# Patient Record
Sex: Female | Born: 1945 | Race: White | Hispanic: No | Marital: Single | Smoking: Former smoker
Health system: Southern US, Community
[De-identification: ages and names within clinical notes are randomized; demographics above are authoritative.]

## PROBLEM LIST (undated history)

## (undated) DIAGNOSIS — M542 Cervicalgia: Secondary | ICD-10-CM

## (undated) DIAGNOSIS — E049 Nontoxic goiter, unspecified: Secondary | ICD-10-CM

## (undated) DIAGNOSIS — R37 Sexual dysfunction, unspecified: Secondary | ICD-10-CM

## (undated) DIAGNOSIS — R0602 Shortness of breath: Secondary | ICD-10-CM

## (undated) DIAGNOSIS — Z8489 Family history of other specified conditions: Secondary | ICD-10-CM

## (undated) DIAGNOSIS — H409 Unspecified glaucoma: Secondary | ICD-10-CM

## (undated) DIAGNOSIS — I10 Essential (primary) hypertension: Secondary | ICD-10-CM

## (undated) DIAGNOSIS — R87619 Unspecified abnormal cytological findings in specimens from cervix uteri: Secondary | ICD-10-CM

## (undated) DIAGNOSIS — Z780 Asymptomatic menopausal state: Secondary | ICD-10-CM

## (undated) DIAGNOSIS — M1990 Unspecified osteoarthritis, unspecified site: Secondary | ICD-10-CM

## (undated) DIAGNOSIS — G8929 Other chronic pain: Secondary | ICD-10-CM

## (undated) DIAGNOSIS — C50919 Malignant neoplasm of unspecified site of unspecified female breast: Secondary | ICD-10-CM

## (undated) DIAGNOSIS — R200 Anesthesia of skin: Secondary | ICD-10-CM

## (undated) DIAGNOSIS — E7800 Pure hypercholesterolemia, unspecified: Secondary | ICD-10-CM

## (undated) DIAGNOSIS — H269 Unspecified cataract: Secondary | ICD-10-CM

## (undated) DIAGNOSIS — Z87898 Personal history of other specified conditions: Secondary | ICD-10-CM

## (undated) DIAGNOSIS — R112 Nausea with vomiting, unspecified: Secondary | ICD-10-CM

## (undated) DIAGNOSIS — R7303 Prediabetes: Secondary | ICD-10-CM

## (undated) DIAGNOSIS — M549 Dorsalgia, unspecified: Secondary | ICD-10-CM

## (undated) DIAGNOSIS — IMO0002 Reserved for concepts with insufficient information to code with codable children: Secondary | ICD-10-CM

## (undated) DIAGNOSIS — T7840XA Allergy, unspecified, initial encounter: Secondary | ICD-10-CM

## (undated) DIAGNOSIS — F32A Depression, unspecified: Secondary | ICD-10-CM

## (undated) DIAGNOSIS — Z9889 Other specified postprocedural states: Secondary | ICD-10-CM

## (undated) DIAGNOSIS — F329 Major depressive disorder, single episode, unspecified: Secondary | ICD-10-CM

## (undated) DIAGNOSIS — R238 Other skin changes: Secondary | ICD-10-CM

## (undated) DIAGNOSIS — R233 Spontaneous ecchymoses: Secondary | ICD-10-CM

## (undated) DIAGNOSIS — D649 Anemia, unspecified: Secondary | ICD-10-CM

## (undated) DIAGNOSIS — G709 Myoneural disorder, unspecified: Secondary | ICD-10-CM

## (undated) DIAGNOSIS — H53459 Other localized visual field defect, unspecified eye: Secondary | ICD-10-CM

## (undated) DIAGNOSIS — M810 Age-related osteoporosis without current pathological fracture: Secondary | ICD-10-CM

## (undated) DIAGNOSIS — E039 Hypothyroidism, unspecified: Secondary | ICD-10-CM

## (undated) DIAGNOSIS — K219 Gastro-esophageal reflux disease without esophagitis: Secondary | ICD-10-CM

## (undated) DIAGNOSIS — R350 Frequency of micturition: Secondary | ICD-10-CM

## (undated) DIAGNOSIS — T4145XA Adverse effect of unspecified anesthetic, initial encounter: Secondary | ICD-10-CM

## (undated) DIAGNOSIS — T8859XA Other complications of anesthesia, initial encounter: Secondary | ICD-10-CM

## (undated) HISTORY — DX: Dorsalgia, unspecified: M54.9

## (undated) HISTORY — DX: Personal history of other specified conditions: Z87.898

## (undated) HISTORY — DX: Age-related osteoporosis without current pathological fracture: M81.0

## (undated) HISTORY — DX: Prediabetes: R73.03

## (undated) HISTORY — DX: Reserved for concepts with insufficient information to code with codable children: IMO0002

## (undated) HISTORY — DX: Anemia, unspecified: D64.9

## (undated) HISTORY — DX: Depression, unspecified: F32.A

## (undated) HISTORY — DX: Unspecified abnormal cytological findings in specimens from cervix uteri: R87.619

## (undated) HISTORY — DX: Myoneural disorder, unspecified: G70.9

## (undated) HISTORY — PX: BIOPSY THYROID: PRO38

## (undated) HISTORY — DX: Malignant neoplasm of unspecified site of unspecified female breast: C50.919

## (undated) HISTORY — PX: BREAST SURGERY: SHX581

## (undated) HISTORY — DX: Nontoxic goiter, unspecified: E04.9

## (undated) HISTORY — PX: CATARACT EXTRACTION, BILATERAL: SHX1313

## (undated) HISTORY — DX: Major depressive disorder, single episode, unspecified: F32.9

## (undated) HISTORY — DX: Sexual dysfunction, unspecified: R37

## (undated) HISTORY — DX: Pure hypercholesterolemia, unspecified: E78.00

## (undated) HISTORY — DX: Unspecified cataract: H26.9

## (undated) HISTORY — DX: Other chronic pain: G89.29

## (undated) HISTORY — DX: Cervicalgia: M54.2

## (undated) HISTORY — PX: TUBAL LIGATION: SHX77

## (undated) HISTORY — DX: Allergy, unspecified, initial encounter: T78.40XA

## (undated) HISTORY — DX: Unspecified glaucoma: H40.9

## (undated) HISTORY — DX: Asymptomatic menopausal state: Z78.0

---

## 1976-11-25 HISTORY — PX: LAPAROSCOPY: SHX197

## 1995-11-26 DIAGNOSIS — C50919 Malignant neoplasm of unspecified site of unspecified female breast: Secondary | ICD-10-CM

## 1995-11-26 HISTORY — PX: OTHER SURGICAL HISTORY: SHX169

## 1995-11-26 HISTORY — DX: Malignant neoplasm of unspecified site of unspecified female breast: C50.919

## 2008-11-25 HISTORY — PX: OTHER SURGICAL HISTORY: SHX169

## 2009-04-03 DIAGNOSIS — C50919 Malignant neoplasm of unspecified site of unspecified female breast: Secondary | ICD-10-CM | POA: Insufficient documentation

## 2009-04-03 DIAGNOSIS — M1990 Unspecified osteoarthritis, unspecified site: Secondary | ICD-10-CM | POA: Insufficient documentation

## 2009-04-03 DIAGNOSIS — E785 Hyperlipidemia, unspecified: Secondary | ICD-10-CM | POA: Insufficient documentation

## 2009-04-03 DIAGNOSIS — E049 Nontoxic goiter, unspecified: Secondary | ICD-10-CM | POA: Insufficient documentation

## 2009-08-15 DIAGNOSIS — Z800 Family history of malignant neoplasm of digestive organs: Secondary | ICD-10-CM | POA: Insufficient documentation

## 2009-08-15 DIAGNOSIS — G8929 Other chronic pain: Secondary | ICD-10-CM | POA: Insufficient documentation

## 2009-08-15 DIAGNOSIS — Z9889 Other specified postprocedural states: Secondary | ICD-10-CM | POA: Insufficient documentation

## 2009-09-28 DIAGNOSIS — G9520 Unspecified cord compression: Secondary | ICD-10-CM | POA: Insufficient documentation

## 2010-07-04 DIAGNOSIS — Z8669 Personal history of other diseases of the nervous system and sense organs: Secondary | ICD-10-CM | POA: Insufficient documentation

## 2010-07-04 DIAGNOSIS — M542 Cervicalgia: Secondary | ICD-10-CM | POA: Insufficient documentation

## 2010-09-25 DIAGNOSIS — E069 Thyroiditis, unspecified: Secondary | ICD-10-CM | POA: Insufficient documentation

## 2011-09-19 LAB — HM PAP SMEAR

## 2011-09-19 LAB — HM MAMMOGRAPHY

## 2011-09-30 DIAGNOSIS — E785 Hyperlipidemia, unspecified: Secondary | ICD-10-CM | POA: Insufficient documentation

## 2012-03-19 ENCOUNTER — Ambulatory Visit (INDEPENDENT_AMBULATORY_CARE_PROVIDER_SITE_OTHER): Payer: BC Managed Care – PPO | Admitting: Internal Medicine

## 2012-03-19 ENCOUNTER — Ambulatory Visit (HOSPITAL_BASED_OUTPATIENT_CLINIC_OR_DEPARTMENT_OTHER)
Admission: RE | Admit: 2012-03-19 | Discharge: 2012-03-19 | Disposition: A | Discharge status: Home / Self Care | Payer: MEDICARE | Source: Ambulatory Visit | Attending: Internal Medicine | Admitting: Internal Medicine

## 2012-03-19 ENCOUNTER — Encounter: Payer: Self-pay | Admitting: Internal Medicine

## 2012-03-19 DIAGNOSIS — L659 Nonscarring hair loss, unspecified: Secondary | ICD-10-CM

## 2012-03-19 DIAGNOSIS — G8929 Other chronic pain: Secondary | ICD-10-CM

## 2012-03-19 DIAGNOSIS — E049 Nontoxic goiter, unspecified: Secondary | ICD-10-CM

## 2012-03-19 DIAGNOSIS — E7800 Pure hypercholesterolemia, unspecified: Secondary | ICD-10-CM | POA: Insufficient documentation

## 2012-03-19 DIAGNOSIS — D649 Anemia, unspecified: Secondary | ICD-10-CM | POA: Insufficient documentation

## 2012-03-19 DIAGNOSIS — R921 Mammographic calcification found on diagnostic imaging of breast: Secondary | ICD-10-CM | POA: Insufficient documentation

## 2012-03-19 DIAGNOSIS — H409 Unspecified glaucoma: Secondary | ICD-10-CM

## 2012-03-19 DIAGNOSIS — R928 Other abnormal and inconclusive findings on diagnostic imaging of breast: Secondary | ICD-10-CM

## 2012-03-19 DIAGNOSIS — H269 Unspecified cataract: Secondary | ICD-10-CM | POA: Insufficient documentation

## 2012-03-19 DIAGNOSIS — E041 Nontoxic single thyroid nodule: Secondary | ICD-10-CM

## 2012-03-19 DIAGNOSIS — M542 Cervicalgia: Secondary | ICD-10-CM

## 2012-03-19 DIAGNOSIS — F32A Depression, unspecified: Secondary | ICD-10-CM | POA: Insufficient documentation

## 2012-03-19 DIAGNOSIS — F329 Major depressive disorder, single episode, unspecified: Secondary | ICD-10-CM | POA: Insufficient documentation

## 2012-03-19 DIAGNOSIS — G8928 Other chronic postprocedural pain: Secondary | ICD-10-CM

## 2012-03-19 DIAGNOSIS — C50919 Malignant neoplasm of unspecified site of unspecified female breast: Secondary | ICD-10-CM

## 2012-03-19 DIAGNOSIS — E785 Hyperlipidemia, unspecified: Secondary | ICD-10-CM

## 2012-03-19 DIAGNOSIS — Z87891 Personal history of nicotine dependence: Secondary | ICD-10-CM

## 2012-03-19 LAB — CBC WITH DIFFERENTIAL/PLATELET
Basophils Absolute: 0 10*3/uL (ref 0.0–0.1)
Basophils Relative: 0 % (ref 0–1)
Eosinophils Absolute: 0.1 10*3/uL (ref 0.0–0.7)
Eosinophils Relative: 1 % (ref 0–5)
HCT: 37.4 % (ref 36.0–46.0)
Hemoglobin: 12.7 g/dL (ref 12.0–15.0)
Lymphocytes Relative: 39 % (ref 12–46)
Lymphs Abs: 3.2 10*3/uL (ref 0.7–4.0)
MCH: 30.8 pg (ref 26.0–34.0)
MCHC: 34 g/dL (ref 30.0–36.0)
MCV: 90.6 fL (ref 78.0–100.0)
Monocytes Absolute: 0.5 10*3/uL (ref 0.1–1.0)
Monocytes Relative: 6 % (ref 3–12)
Neutro Abs: 4.4 10*3/uL (ref 1.7–7.7)
Neutrophils Relative %: 54 % (ref 43–77)
Platelets: 285 10*3/uL (ref 150–400)
RBC: 4.13 MIL/uL (ref 3.87–5.11)
RDW: 12.6 % (ref 11.5–15.5)
WBC: 8.2 10*3/uL (ref 4.0–10.5)

## 2012-03-19 MED ORDER — IBUPROFEN 800 MG PO TABS: 800.0000 mg | ORAL_TABLET | Freq: Four times a day (QID) | ORAL | Status: DC | PRN

## 2012-03-19 NOTE — Progress Notes (Signed)
Subjective:    Patient ID: [name] [name], female    DOB: 29-Apr-1946, 66 y.o.   MRN: [id number]  HPI  New pt here for first visit.  Has only been in [place] for 3 monrths.  Former care in [place].    PMH of R breast CA S/P lumpectomy and XRT, with Tamoxifen for 5 years., anemia  Breast calcifications on mammogram 08/2011, remote abnormal pap, Hyperlipidemia, thyroid goiter with benign thyroid bx per pt report, glaucoma, and former smoker.   She also has chronic neck and back pain since her neck fusion in 2010.   She uses Motrin 800 mg for this.    Overall doing well but wonder if her thyroid is normal as she has dry skin and hair is falling out.    Regarding chronic pain, she has had steroid injetions and does not wish any more of these or narcotic meds.  She would like to see a neurologist to discuss options.    No Known Allergies Past Medical History  Diagnosis Date  . Chronic back pain   . Chronic neck pain   . Goiter   . Breast cancer   . Cataracts, bilateral   . Abnormal Pap smear   . Anemia   . Depression   . High cholesterol   . Post-menopausal   . Sexual dysfunction   . Urinary incontinence    Past Surgical History  Procedure Date  . Lumpectomy 1997    Rt  . Cataract extraction, bilateral [id number]  . Laparoscopy 1978    BTL  . Neck fusion 2010  . Biopsy thyroid    History   Social History  . Marital Status: Single    Spouse Name: N/A    Number of Children: N/A  . Years of Education: N/A   Occupational History  . retired    Social History Main Topics  . Smoking status: Former Smoker -- 0.5 packs/day for 40 years    Types: Cigarettes  . Smokeless tobacco: Never Used  . Alcohol Use: Yes     1-2 per month  . Drug Use: No  . Sexually Active: Not Currently   Other Topics Concern  . Not on file   Social History Narrative  . No narrative on file   Family History  Problem Relation Age of Onset  . Cervical cancer Daughter   . Heart disease Sister   .  Heart disease Mother   . Heart disease Brother   . Colon cancer Father   . Stroke Sister   . Cancer Brother     esophageal  . Asthma Maternal Grandmother   . Diabetes Mother   . Diabetes Brother   . Hypertension Mother   . Hypertension Sister   . Hypertension Brother   . Thyroid disease Sister   . Thyroid disease Daughter    There is no problem list on file for this patient.  No current outpatient prescriptions on file prior to visit.     Review of Systems See HPI    Objective:   Physical Exam Physical Exam  Nursing note and vitals reviewed.  Constitutional: She is oriented to person, place, and time. She appears well-developed and well-nourished.  HENT:  Head: Normocephalic and atraumatic.  Neck  Slight thyroid enlargement Cardiovascular: Normal rate and regular rhythm. Exam reveals no gallop and no friction rub.  No murmur heard.  Pulmonary/Chest: Breath sounds normal. She has no wheezes. She has no rales.  Neurological: She is alert and oriented to  person, place, and time.  Skin: Skin is warm and dry.  Psychiatric: She has a normal mood and affect. Her behavior is normal.        Assessment & Plan:  1) History of goiter  Will get thyroid ultrasound check free levels today 2)  Hyperlipidemia:  Recheck today 3)  Chronic neck/back pain  Will refer to Dr. Wyvonne Lenz to have Motrin for now 4)  R breast CA:  Will need old records  Had calcifications per pt report on prior mammogram 5)  Anemia  Check today 6)

## 2012-03-19 NOTE — Patient Instructions (Signed)
Labs will be mailed to you  Referral to Dr. Maple Hudson  See me in 2 months

## 2012-03-20 LAB — COMPREHENSIVE METABOLIC PANEL
ALT: 15 U/L (ref 0–35)
AST: 14 U/L (ref 0–37)
Albumin: 4.6 g/dL (ref 3.5–5.2)
Alkaline Phosphatase: 60 U/L (ref 39–117)
BUN: 15 mg/dL (ref 6–23)
CO2: 28 mEq/L (ref 19–32)
Calcium: 9.6 mg/dL (ref 8.4–10.5)
Chloride: 98 mEq/L (ref 96–112)
Creat: 0.72 mg/dL (ref 0.50–1.10)
Glucose, Bld: 89 mg/dL (ref 70–99)
Potassium: 4.1 mEq/L (ref 3.5–5.3)
Sodium: 135 mEq/L (ref 135–145)
Total Bilirubin: 0.4 mg/dL (ref 0.3–1.2)
Total Protein: 7.1 g/dL (ref 6.0–8.3)

## 2012-03-20 LAB — T3, FREE: T3, Free: 2.7 pg/mL (ref 2.3–4.2)

## 2012-03-20 LAB — VITAMIN D 25 HYDROXY (VIT D DEFICIENCY, FRACTURES): Vit D, 25-Hydroxy: 40 ng/mL (ref 30–89)

## 2012-03-20 LAB — LIPID PANEL
Cholesterol: 243 mg/dL — ABNORMAL HIGH (ref 0–200)
HDL: 50 mg/dL (ref >39)
LDL Cholesterol: 160 mg/dL — ABNORMAL HIGH (ref 0–99)
Total CHOL/HDL Ratio: 4.9 Ratio
Triglycerides: 163 mg/dL — ABNORMAL HIGH (ref <150)
VLDL: 33 mg/dL (ref 0–40)

## 2012-03-20 LAB — T4, FREE: Free T4: 1.02 ng/dL (ref 0.80–1.80)

## 2012-03-20 LAB — VITAMIN B12: Vitamin B-12: 914 pg/mL — ABNORMAL HIGH (ref 211–911)

## 2012-03-23 ENCOUNTER — Telehealth: Payer: Self-pay | Admitting: Internal Medicine

## 2012-03-23 ENCOUNTER — Encounter: Payer: Self-pay | Admitting: *Deleted

## 2012-03-23 NOTE — Telephone Encounter (Signed)
Called home and mobile number and left message we are calling with some information, please call office

## 2012-03-23 NOTE — Progress Notes (Signed)
Called patient home and mobile number and left messages we are calling with some information from your doctor , please call office Tuesday am.

## 2012-03-23 NOTE — Telephone Encounter (Signed)
Message copied by Gerome Apley on Mon Mar 23, 2012 12:48 PM ------      Message from: Raechel Chute D      Created: Mon Mar 23, 2012  8:33 AM       Bonita Quin            Call pt and let her know that her bad cholesterol is very high.  I need to discuss with her in office.  Tell Ardenia to give her a 30 min appt and message back to me with appt time            Thanks

## 2012-03-23 NOTE — Telephone Encounter (Signed)
Left message on mobile and home phone to call regarding xray results

## 2012-03-24 NOTE — Telephone Encounter (Signed)
[name] [name] called and left another message returning our call. Randol Kern and discussed her bad cholesterol levels very high and gave her the result at her request. Also scheduled an appoinment to discuss with Dr. Constance Goltz for 03/26/12 at 4:15

## 2012-03-26 ENCOUNTER — Ambulatory Visit (INDEPENDENT_AMBULATORY_CARE_PROVIDER_SITE_OTHER): Payer: BC Managed Care – PPO | Admitting: Internal Medicine

## 2012-03-26 ENCOUNTER — Encounter: Payer: Self-pay | Admitting: Internal Medicine

## 2012-03-26 VITALS — BP 134/65 | HR 55 | Temp 97.6°F | Resp 16 | Ht 63.5 in | Wt 169.0 lb

## 2012-03-26 DIAGNOSIS — E785 Hyperlipidemia, unspecified: Secondary | ICD-10-CM

## 2012-03-26 DIAGNOSIS — G8928 Other chronic postprocedural pain: Secondary | ICD-10-CM

## 2012-03-26 MED ORDER — IBUPROFEN 800 MG PO TABS: 800.0000 mg | ORAL_TABLET | Freq: Four times a day (QID) | ORAL | Status: DC | PRN

## 2012-03-26 MED ORDER — SIMVASTATIN 5 MG PO TABS: 5.0000 mg | ORAL_TABLET | Freq: Every day | ORAL | Status: DC

## 2012-03-26 NOTE — Progress Notes (Signed)
Subjective:    Patient ID: [name] [name], female    DOB: 09/18/1946, 66 y.o.   MRN: [id number]  HPI  See lipids.  Pt reports she was on lipitor and vytorin in past but could not tolerate due to muscle aches. Strong  Pending consult with neurologist for pain control.  She has been on chronic Motrin for years Framingham risk analysis done and reviewed with pt.  Risk 12%  No Known Allergies Past Medical History  Diagnosis Date  . Chronic back pain   . Chronic neck pain   . Abnormal Pap smear   . Post-menopausal   . Sexual dysfunction   . Urinary incontinence   . Anemia   . Breast cancer   . Cataracts, bilateral   . Depression   . High cholesterol   . Goiter    Past Surgical History  Procedure Date  . Lumpectomy 1997    Rt  . Cataract extraction, bilateral [id number]  . Laparoscopy 1978    BTL  . Neck fusion 2010  . Biopsy thyroid    History   Social History  . Marital Status: Single    Spouse Name: N/A    Number of Children: N/A  . Years of Education: N/A   Occupational History  . retired    Social History Main Topics  . Smoking status: Former Smoker -- 0.5 packs/day for 40 years    Types: Cigarettes  . Smokeless tobacco: Never Used  . Alcohol Use: Yes     1-2 per month  . Drug Use: No  . Sexually Active: Not Currently   Other Topics Concern  . Not on file   Social History Narrative  . No narrative on file   Family History  Problem Relation Age of Onset  . Cervical cancer Daughter   . Heart disease Sister   . Heart disease Mother   . Heart disease Brother   . Colon cancer Father   . Stroke Sister   . Cancer Brother     esophageal  . Asthma Maternal Grandmother   . Diabetes Mother   . Diabetes Brother   . Hypertension Mother   . Hypertension Sister   . Hypertension Brother   . Thyroid disease Sister   . Thyroid disease Daughter    Patient Active Problem List  Diagnoses  . Anemia  . Breast cancer  . Cataracts, bilateral  . Depression    . High cholesterol  . Goiter  . Glaucoma  . Former smoker  . Chronic pain following surgery or procedure  . Breast calcifications on mammogram   Current Outpatient Prescriptions on File Prior to Visit  Medication Sig Dispense Refill  . fish oil-omega-3 fatty acids 1000 MG capsule Take 1 g by mouth daily.      Marland Kitchen ibuprofen (ADVIL,MOTRIN) 800 MG tablet Take 1 tablet (800 mg total) by mouth every 6 (six) hours as needed.  90 tablet  0  . Iodine Strong, Lugols, (IODINE STRONG PO) Take 3 drops by mouth.      . latanoprost (XALATAN) 0.005 % ophthalmic solution       . Multiple Vitamin (MULTIVITAMIN) tablet Take 1 tablet by mouth daily.          Review of Systems    see HPI Objective:   Physical Exam Physical Exam  Nursing note and vitals reviewed.  Constitutional: She is oriented to person, place, and time. She appears well-developed and well-nourished.  HENT:  Head: Normocephalic and atraumatic.  Cardiovascular: Normal  rate and regular rhythm. Exam reveals no gallop and no friction rub.  No murmur heard.  Pulmonary/Chest: Breath sounds normal. She has no wheezes. She has no rales.  Neurological: She is alert and oriented to person, place, and time.  Skin: Skin is warm and dry.  Psychiatric: She has a normal mood and affect. Her behavior is normal.        Assessment & Plan:  1)hyperlipidemia  With Framingham risk over 10% pt agreeable to try Zocor 5 mg 3 times per week.  If any muscle aches she is to call office.  See me in 4-5 weeks after starting med to check her LFt's.  She voices understanding 2)  Chronic pain  Ok for short term Motrin.  She is aware of GI and hepatic and renal risk of long term use. I offered tylenol with codiene but she declines.  Await neruolgoy appt.   3)

## 2012-03-26 NOTE — Patient Instructions (Signed)
Take medicine as prescribed   Call me if any side effects  See mein 4-5 weeks after starting med

## 2012-04-06 ENCOUNTER — Telehealth: Payer: Self-pay | Admitting: Internal Medicine

## 2012-04-06 DIAGNOSIS — E042 Nontoxic multinodular goiter: Secondary | ICD-10-CM | POA: Insufficient documentation

## 2012-04-06 NOTE — Telephone Encounter (Signed)
Spoke with pt and informed of two solid thyroid nodules.  Will refer to Dr. Talmage Nap for further management.  Pt leaving town to go to [place] to spend time with daughter.  She requests appt for August.  Advised to bring old records and previous thyroid ultrasounds  She voices understanding

## 2012-04-06 NOTE — Telephone Encounter (Signed)
Left message at pts home phone number to call office regarding thyroid ultrasound xray

## 2012-04-07 ENCOUNTER — Telehealth: Payer: Self-pay | Admitting: *Deleted

## 2012-04-07 NOTE — Telephone Encounter (Signed)
Received fax re:  appt for [facility] per our referral- called patient and notified she has appt with Dr. Talmage Nap 07/16/12 at 9:00

## 2012-05-30 ENCOUNTER — Telehealth: Payer: Self-pay | Admitting: Internal Medicine

## 2012-05-30 ENCOUNTER — Encounter: Payer: Self-pay | Admitting: Internal Medicine

## 2012-05-30 DIAGNOSIS — M47812 Spondylosis without myelopathy or radiculopathy, cervical region: Secondary | ICD-10-CM | POA: Insufficient documentation

## 2012-05-30 DIAGNOSIS — M51369 Other intervertebral disc degeneration, lumbar region without mention of lumbar back pain or lower extremity pain: Secondary | ICD-10-CM | POA: Insufficient documentation

## 2012-05-30 DIAGNOSIS — Z800 Family history of malignant neoplasm of digestive organs: Secondary | ICD-10-CM | POA: Insufficient documentation

## 2012-05-30 NOTE — Telephone Encounter (Signed)
Lora,  I do not see typed mammogram results in EPIC from April test.  Please get results and place on my desk  Pt was to see me after starting her cholesterol med but I do not see appt scheduled.  Please give 30 min appt after I return from CME.  Not on Monday the 22nd.  Ask pt if she stared her Zocor like we discussed last visit.  thanks

## 2012-06-08 ENCOUNTER — Encounter: Payer: Self-pay | Admitting: *Deleted

## 2012-08-25 HISTORY — PX: EYE SURGERY: SHX253

## 2012-09-16 DIAGNOSIS — H3321 Serous retinal detachment, right eye: Secondary | ICD-10-CM | POA: Insufficient documentation

## 2012-09-16 DIAGNOSIS — Z853 Personal history of malignant neoplasm of breast: Secondary | ICD-10-CM | POA: Insufficient documentation

## 2012-09-17 ENCOUNTER — Other Ambulatory Visit (HOSPITAL_COMMUNITY): Payer: Self-pay | Admitting: Endocrinology

## 2012-09-17 DIAGNOSIS — E041 Nontoxic single thyroid nodule: Secondary | ICD-10-CM

## 2012-10-01 ENCOUNTER — Ambulatory Visit (HOSPITAL_COMMUNITY)
Admission: RE | Admit: 2012-10-01 | Discharge: 2012-10-01 | Disposition: A | Discharge status: Home / Self Care | Payer: MEDICARE | Source: Ambulatory Visit | Attending: Endocrinology | Admitting: Endocrinology

## 2012-10-01 DIAGNOSIS — E041 Nontoxic single thyroid nodule: Secondary | ICD-10-CM

## 2012-10-01 DIAGNOSIS — E042 Nontoxic multinodular goiter: Secondary | ICD-10-CM | POA: Insufficient documentation

## 2012-10-01 MED ORDER — SODIUM PERTECHNETATE TC 99M INJECTION
10.5000 | Freq: Once | INTRAVENOUS | Status: AC | PRN
  Administered 2012-10-01: 11 via INTRAVENOUS

## 2012-11-22 ENCOUNTER — Encounter: Payer: Self-pay | Admitting: Internal Medicine

## 2012-12-16 ENCOUNTER — Telehealth: Payer: Self-pay | Admitting: *Deleted

## 2012-12-16 ENCOUNTER — Ambulatory Visit (HOSPITAL_BASED_OUTPATIENT_CLINIC_OR_DEPARTMENT_OTHER)
Admission: RE | Admit: 2012-12-16 | Discharge: 2012-12-16 | Disposition: A | Discharge status: Home / Self Care | Payer: MEDICARE | Source: Ambulatory Visit | Attending: Internal Medicine | Admitting: Internal Medicine

## 2012-12-16 DIAGNOSIS — Z139 Encounter for screening, unspecified: Secondary | ICD-10-CM

## 2012-12-16 DIAGNOSIS — Z1231 Encounter for screening mammogram for malignant neoplasm of breast: Secondary | ICD-10-CM | POA: Insufficient documentation

## 2012-12-16 NOTE — Telephone Encounter (Signed)
Pt called for CPE and MM. MM overdue order entered and # given to pt to make an appt

## 2012-12-28 ENCOUNTER — Encounter: Payer: Self-pay | Admitting: Internal Medicine

## 2012-12-28 ENCOUNTER — Ambulatory Visit (INDEPENDENT_AMBULATORY_CARE_PROVIDER_SITE_OTHER): Payer: MEDICARE | Admitting: Internal Medicine

## 2012-12-28 VITALS — BP 156/90 | HR 64 | Temp 98.4°F | Resp 16 | Ht 64.0 in | Wt 165.0 lb

## 2012-12-28 DIAGNOSIS — H269 Unspecified cataract: Secondary | ICD-10-CM

## 2012-12-28 DIAGNOSIS — E7800 Pure hypercholesterolemia, unspecified: Secondary | ICD-10-CM

## 2012-12-28 DIAGNOSIS — G8928 Other chronic postprocedural pain: Secondary | ICD-10-CM

## 2012-12-28 DIAGNOSIS — E049 Nontoxic goiter, unspecified: Secondary | ICD-10-CM

## 2012-12-28 DIAGNOSIS — I10 Essential (primary) hypertension: Secondary | ICD-10-CM

## 2012-12-28 LAB — COMPREHENSIVE METABOLIC PANEL
ALT: 25 U/L (ref 0–35)
AST: 21 U/L (ref 0–37)
Albumin: 4.7 g/dL (ref 3.5–5.2)
Alkaline Phosphatase: 71 U/L (ref 39–117)
BUN: 15 mg/dL (ref 6–23)
CO2: 29 mEq/L (ref 19–32)
Calcium: 9.9 mg/dL (ref 8.4–10.5)
Chloride: 99 mEq/L (ref 96–112)
Creat: 0.67 mg/dL (ref 0.50–1.10)
Glucose, Bld: 83 mg/dL (ref 70–99)
Potassium: 4.4 mEq/L (ref 3.5–5.3)
Sodium: 139 mEq/L (ref 135–145)
Total Bilirubin: 0.5 mg/dL (ref 0.3–1.2)
Total Protein: 7.1 g/dL (ref 6.0–8.3)

## 2012-12-28 LAB — CBC WITH DIFFERENTIAL/PLATELET
Basophils Absolute: 0 10*3/uL (ref 0.0–0.1)
Basophils Relative: 0 % (ref 0–1)
Eosinophils Absolute: 0.1 10*3/uL (ref 0.0–0.7)
Eosinophils Relative: 1 % (ref 0–5)
HCT: 37.5 % (ref 36.0–46.0)
Hemoglobin: 13.2 g/dL (ref 12.0–15.0)
Lymphocytes Relative: 45 % (ref 12–46)
Lymphs Abs: 3.1 10*3/uL (ref 0.7–4.0)
MCH: 30.7 pg (ref 26.0–34.0)
MCHC: 35.2 g/dL (ref 30.0–36.0)
MCV: 87.2 fL (ref 78.0–100.0)
Monocytes Absolute: 0.6 10*3/uL (ref 0.1–1.0)
Monocytes Relative: 8 % (ref 3–12)
Neutro Abs: 3.1 10*3/uL (ref 1.7–7.7)
Neutrophils Relative %: 46 % (ref 43–77)
Platelets: 305 10*3/uL (ref 150–400)
RBC: 4.3 MIL/uL (ref 3.87–5.11)
RDW: 13.1 % (ref 11.5–15.5)
WBC: 6.9 10*3/uL (ref 4.0–10.5)

## 2012-12-28 LAB — LIPID PANEL
Cholesterol: 239 mg/dL — ABNORMAL HIGH (ref 0–200)
HDL: 59 mg/dL (ref >39)
LDL Cholesterol: 147 mg/dL — ABNORMAL HIGH (ref 0–99)
Total CHOL/HDL Ratio: 4.1 Ratio
Triglycerides: 166 mg/dL — ABNORMAL HIGH (ref <150)
VLDL: 33 mg/dL (ref 0–40)

## 2012-12-28 LAB — T4, FREE: Free T4: 1.04 ng/dL (ref 0.80–1.80)

## 2012-12-28 LAB — T3, FREE: T3, Free: 3.1 pg/mL (ref 2.3–4.2)

## 2012-12-28 LAB — TSH: TSH: 1.41 u[IU]/mL (ref 0.350–4.500)

## 2012-12-28 MED ORDER — AMLODIPINE BESYLATE 5 MG PO TABS: 5.0000 mg | ORAL_TABLET | Freq: Every day | ORAL | Status: DC

## 2012-12-28 NOTE — Patient Instructions (Addendum)
See me in 3 weeks  Take BP pill every day

## 2012-12-28 NOTE — Progress Notes (Signed)
Subjective:    Patient ID: [name] [name], female    DOB: 01/03/1946, 67 y.o.   MRN: [id number]  HPI [name] [name] is here for acute visit.  She was told her Bp was high at the dentist office "170's"  And on one other occasion she was told it was high'  Cataract surgery on R eye was complicated by bleeding.  She reports she did see Dr. Talmage Nap who referred her to a thyroid surgeon but she states "I just cannot have another surgery now - my eye still hurts"  She stated Dr. Talmage Nap did give her the option of returning in 6 months  She did see Dr. Maple Hudson who placed her on Savella but she read that it can cause HTN  So she stopped in two weeks ago.    She has occasional headache.  Strong FH of HTN in multiple relatives.  NO chest pain or SOB  No Known Allergies Past Medical History  Diagnosis Date  . Chronic back pain   . Chronic neck pain   . Abnormal Pap smear   . Post-menopausal   . Sexual dysfunction   . Urinary incontinence   . Anemia   . Breast cancer   . Cataracts, bilateral   . Depression   . High cholesterol   . Goiter    Past Surgical History  Procedure Date  . Lumpectomy 1997    Rt  . Cataract extraction, bilateral [id number]  . Laparoscopy 1978    BTL  . Neck fusion 2010  . Biopsy thyroid    History   Social History  . Marital Status: Single    Spouse Name: N/A    Number of Children: N/A  . Years of Education: N/A   Occupational History  . retired    Social History Main Topics  . Smoking status: Former Smoker -- 0.5 packs/day for 40 years    Types: Cigarettes  . Smokeless tobacco: Never Used  . Alcohol Use: Yes     Comment: 1-2 per month  . Drug Use: No  . Sexually Active: Not Currently   Other Topics Concern  . Not on file   Social History Narrative  . No narrative on file   Family History  Problem Relation Age of Onset  . Cervical cancer Daughter   . Heart disease Sister   . Heart disease Mother   . Heart disease Brother   . Colon cancer Father    . Stroke Sister   . Cancer Brother     esophageal  . Asthma Maternal Grandmother   . Diabetes Mother   . Diabetes Brother   . Hypertension Mother   . Hypertension Sister   . Hypertension Brother   . Thyroid disease Sister   . Thyroid disease Daughter    Patient Active Problem List  Diagnosis  . Anemia  . Breast cancer  . Cataracts, bilateral  . Depression  . High cholesterol  . Goiter  . Glaucoma  . Former smoker  . Chronic pain following surgery or procedure  . Breast calcifications on mammogram  . Multiple thyroid nodules  . Family history of colon cancer  . DJD (degenerative joint disease), cervical  . HTN (hypertension)   Current Outpatient Prescriptions on File Prior to Visit  Medication Sig Dispense Refill  . fish oil-omega-3 fatty acids 1000 MG capsule Take 1 g by mouth daily.      Marland Kitchen ibuprofen (ADVIL,MOTRIN) 800 MG tablet Take 1 tablet (800 mg total) by mouth every 6 (  six) hours as needed.  90 tablet  0  . Iodine Strong, Lugols, (IODINE STRONG PO) Take 3 drops by mouth.      . latanoprost (XALATAN) 0.005 % ophthalmic solution       . Multiple Vitamin (MULTIVITAMIN) tablet Take 1 tablet by mouth daily.      Marland Kitchen amLODipine (NORVASC) 5 MG tablet Take 1 tablet (5 mg total) by mouth daily.  90 tablet  1  . simvastatin (ZOCOR) 5 MG tablet Take 1 tablet (5 mg total) by mouth at bedtime.  90 tablet  0       Review of Systems see HPI   Objective:   Physical Exam Physical Exam  Nursing note and vitals reviewed.  Constitutional: She is oriented to person, place, and time. She appears well-developed and well-nourished.  HENT:  Head: Normocephalic and atraumatic.  Cardiovascular: Normal rate and regular rhythm. Exam reveals no gallop and no friction rub.  No murmur heard.  Pulmonary/Chest: Breath sounds normal. She has no wheezes. She has no rales.  Neurological: She is alert and oriented to person, place, and time.  Skin: Skin is warm and dry.  Psychiatric: She has  a normal mood and affect. Her behavior is normal.  Ext no edema        Assessment & Plan:  New onset HTN:  Will start amlodipine 5 mg daily  See me in 3 weeks  Chronic neck pain  Advised to see Dr. Maple Hudson  Recent cataract surgery  Thyroid nodule  She is to follow up with Dr. Talmage Nap.  Pt voices understanding

## 2012-12-30 ENCOUNTER — Encounter: Payer: Self-pay | Admitting: *Deleted

## 2013-01-18 ENCOUNTER — Ambulatory Visit (INDEPENDENT_AMBULATORY_CARE_PROVIDER_SITE_OTHER): Payer: MEDICARE | Admitting: Internal Medicine

## 2013-01-18 ENCOUNTER — Encounter: Payer: Self-pay | Admitting: Internal Medicine

## 2013-01-18 VITALS — BP 130/70 | HR 60 | Temp 97.5°F | Resp 18 | Ht 64.0 in | Wt 167.0 lb

## 2013-01-18 DIAGNOSIS — I10 Essential (primary) hypertension: Secondary | ICD-10-CM

## 2013-01-18 DIAGNOSIS — H338 Other retinal detachments: Secondary | ICD-10-CM

## 2013-01-18 DIAGNOSIS — K5900 Constipation, unspecified: Secondary | ICD-10-CM

## 2013-01-18 DIAGNOSIS — N3946 Mixed incontinence: Secondary | ICD-10-CM

## 2013-01-18 LAB — POCT URINALYSIS DIPSTICK
Bilirubin, UA: NEGATIVE
Blood, UA: NEGATIVE
Glucose, UA: NEGATIVE
Ketones, UA: NEGATIVE
Leukocytes, UA: NEGATIVE
Nitrite, UA: NEGATIVE
Protein, UA: NEGATIVE
Spec Grav, UA: 1.01
Urobilinogen, UA: NEGATIVE
pH, UA: 7

## 2013-01-18 NOTE — Progress Notes (Signed)
Subjective:    Patient ID: [name] [name], female    DOB: 1946-03-28, 67 y.o.   MRN: [id number]  HPI Cherri is here for follow up after initiating amlodipine 5 mg.    She has multiple concerns today.  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  She sees her opthalmologist tomorrow.   She has chronic constipation and hates Miralax.  She has urinary leakage, both with coughing, sneezing and urgency.  No pain  "Been going on for years"  No Known Allergies Past Medical History  Diagnosis Date  . Chronic back pain   . Chronic neck pain   . Abnormal Pap smear   . Post-menopausal   . Sexual dysfunction   . Urinary incontinence   . Anemia   . Breast cancer   . Cataracts, bilateral   . Depression   . High cholesterol   . Goiter    Past Surgical History  Procedure Laterality Date  . Lumpectomy  1997    Rt  . Cataract extraction, bilateral  [id number]  . Laparoscopy  1978    BTL  . Neck fusion  2010  . Biopsy thyroid     History   Social History  . Marital Status: Single    Spouse Name: N/A    Number of Children: N/A  . Years of Education: N/A   Occupational History  . retired    Social History Main Topics  . Smoking status: Former Smoker -- 0.50 packs/day for 40 years    Types: Cigarettes  . Smokeless tobacco: Never Used  . Alcohol Use: Yes     Comment: 1-2 per month  . Drug Use: No  . Sexually Active: Not Currently   Other Topics Concern  . Not on file   Social History Narrative  . No narrative on file   Family History  Problem Relation Age of Onset  . Cervical cancer Daughter   . Heart disease Sister   . Heart disease Mother   . Heart disease Brother   . Colon cancer Father   . Stroke Sister   . Cancer Brother     esophageal  . Asthma Maternal Grandmother   . Diabetes Mother   . Diabetes Brother   . Hypertension Mother   . Hypertension Sister   . Hypertension Brother   . Thyroid disease Sister   . Thyroid disease Daughter     Patient Active Problem List  Diagnosis  . Anemia  . Breast cancer  . Cataracts, bilateral  . Depression  . High cholesterol  . Goiter  . Glaucoma  . Former smoker  . Chronic pain following surgery or procedure  . Breast calcifications on mammogram  . Multiple thyroid nodules  . Family history of colon cancer  . DJD (degenerative joint disease), cervical  . HTN (hypertension)  . Other forms of retinal detachment  . Urinary incontinence, mixed  . Unspecified constipation   Current Outpatient Prescriptions on File Prior to Visit  Medication Sig Dispense Refill  . amLODipine (NORVASC) 5 MG tablet Take 1 tablet (5 mg total) by mouth daily.  90 tablet  1  . fish oil-omega-3 fatty acids 1000 MG capsule Take 1 g by mouth daily.      Marland Kitchen latanoprost (XALATAN) 0.005 % ophthalmic solution       . Multiple Vitamin (MULTIVITAMIN) tablet Take 1 tablet by mouth daily.      . simvastatin (ZOCOR) 5 MG tablet Take 1 tablet (5  mg total) by mouth at bedtime.  90 tablet  0  . ibuprofen (ADVIL,MOTRIN) 800 MG tablet Take 1 tablet (800 mg total) by mouth every 6 (six) hours as needed.  90 tablet  0  . Iodine Strong, Lugols, (IODINE STRONG PO) Take 3 drops by mouth.       No current facility-administered medications on file prior to visit.       Review of Systems    see HPI Objective:   Physical Exam Physical Exam  Nursing note and vitals reviewed.  Constitutional: She is oriented to person, place, and time. She appears well-developed and well-nourished.  HENT:  Head: Normocephalic and atraumatic.  Cardiovascular: Normal rate and regular rhythm. Exam reveals no gallop and no friction rub.  No murmur heard.  Pulmonary/Chest: Breath sounds normal. She has no wheezes. She has no rales.  Neurological: She is alert and oriented to person, place, and time.  Skin: Skin is warm and dry.  Psychiatric: She has a normal mood and affect. Her behavior is normal.             Assessment &  Plan:  HTN:  Continue amlodipine  Constipation  Advised Colace and Miralax on as needed basis  Urinary incontinence mixed:  I offered PT referral but pt declines right now as she has a lot of MD visits  History of depression  She does not wish meds.  She receives counseleing at church  "Thank God I have a strong faith"

## 2013-01-18 NOTE — Patient Instructions (Addendum)
Take Coalce (Docusate sodium)  One capsule bid,  Can decrease the dose as necessary

## 2013-01-24 ENCOUNTER — Other Ambulatory Visit: Payer: Self-pay | Admitting: Internal Medicine

## 2013-01-28 ENCOUNTER — Other Ambulatory Visit: Payer: Self-pay | Admitting: Endocrinology

## 2013-01-28 DIAGNOSIS — E049 Nontoxic goiter, unspecified: Secondary | ICD-10-CM

## 2013-02-09 ENCOUNTER — Ambulatory Visit
Admission: RE | Admit: 2013-02-09 | Discharge: 2013-02-09 | Disposition: A | Discharge status: Home / Self Care | Payer: MEDICARE | Source: Ambulatory Visit | Attending: Endocrinology | Admitting: Endocrinology

## 2013-02-09 DIAGNOSIS — E049 Nontoxic goiter, unspecified: Secondary | ICD-10-CM

## 2013-02-10 ENCOUNTER — Other Ambulatory Visit: Payer: MEDICARE

## 2013-02-25 ENCOUNTER — Encounter: Payer: Self-pay | Admitting: Internal Medicine

## 2013-02-25 ENCOUNTER — Ambulatory Visit (INDEPENDENT_AMBULATORY_CARE_PROVIDER_SITE_OTHER): Payer: MEDICARE | Admitting: Internal Medicine

## 2013-02-25 ENCOUNTER — Other Ambulatory Visit (HOSPITAL_COMMUNITY)
Admission: RE | Admit: 2013-02-25 | Discharge: 2013-02-25 | Disposition: A | Discharge status: Home / Self Care | Payer: MEDICARE | Source: Ambulatory Visit | Attending: Internal Medicine | Admitting: Internal Medicine

## 2013-02-25 ENCOUNTER — Telehealth: Payer: Self-pay | Admitting: Internal Medicine

## 2013-02-25 VITALS — BP 128/72 | HR 63 | Temp 98.0°F | Resp 18 | Ht 64.0 in | Wt 170.0 lb

## 2013-02-25 DIAGNOSIS — E7800 Pure hypercholesterolemia, unspecified: Secondary | ICD-10-CM

## 2013-02-25 DIAGNOSIS — Z800 Family history of malignant neoplasm of digestive organs: Secondary | ICD-10-CM

## 2013-02-25 DIAGNOSIS — C50919 Malignant neoplasm of unspecified site of unspecified female breast: Secondary | ICD-10-CM

## 2013-02-25 DIAGNOSIS — E042 Nontoxic multinodular goiter: Secondary | ICD-10-CM

## 2013-02-25 DIAGNOSIS — Z1211 Encounter for screening for malignant neoplasm of colon: Secondary | ICD-10-CM

## 2013-02-25 DIAGNOSIS — Z1151 Encounter for screening for human papillomavirus (HPV): Secondary | ICD-10-CM | POA: Insufficient documentation

## 2013-02-25 DIAGNOSIS — C50911 Malignant neoplasm of unspecified site of right female breast: Secondary | ICD-10-CM

## 2013-02-25 DIAGNOSIS — Z0000 Encounter for general adult medical examination without abnormal findings: Secondary | ICD-10-CM

## 2013-02-25 DIAGNOSIS — Z124 Encounter for screening for malignant neoplasm of cervix: Secondary | ICD-10-CM | POA: Insufficient documentation

## 2013-02-25 DIAGNOSIS — Z780 Asymptomatic menopausal state: Secondary | ICD-10-CM

## 2013-02-25 NOTE — Telephone Encounter (Signed)
Per Tresa Endo.. By the referral going thru Epic... Raiford Noble will see the referral and contact pt to set up appointment... Pt informed of process.... Ad

## 2013-02-25 NOTE — Telephone Encounter (Signed)
Pt will go to [facility]: nurse visit on 05.09.14 at 10 am and procedure 05.23.14 at 1130 am arrival at 1030 am... Pt made aware of appointment thru referral sheet... As well told that a new pt packet will be mailed to here with all information.... Ad    [facility] 11 am pt given referral before leaving her office visit... Ad

## 2013-02-25 NOTE — Patient Instructions (Addendum)
See me in 3 months

## 2013-02-25 NOTE — Progress Notes (Signed)
Subjective:    Patient ID: [name] [name], female    DOB: 10-Oct-1946, 67 y.o.   MRN: [id number]  HPI [name] [name] is here for CPE  Thyroid nodules she reports her nuclear scan by Dr. Talmage Nap showed hot and cold nodules and she was counseled to see a surgeon but " I just can't do it right now"  Lots of stress, brothers death,  Recent complicated retinal surgery.  She does plan to see a surgeon that was recommended to her at some point  Chronic neck pain  She was recently placed on new med by Dr. Maple Hudson  ??? Name  Breast cancer:  She has not seen an oncologist here in [place] as yet.  She will need surveillance care established here  No Known Allergies Past Medical History  Diagnosis Date  . Chronic back pain   . Chronic neck pain   . Abnormal Pap smear   . Post-menopausal   . Sexual dysfunction   . Urinary incontinence   . Anemia   . Breast cancer   . Cataracts, bilateral   . Depression   . High cholesterol   . Goiter    Past Surgical History  Procedure Laterality Date  . Lumpectomy  1997    Rt  . Cataract extraction, bilateral  [id number]  . Laparoscopy  1978    BTL  . Neck fusion  2010  . Biopsy thyroid     History   Social History  . Marital Status: Single    Spouse Name: N/A    Number of Children: N/A  . Years of Education: N/A   Occupational History  . retired    Social History Main Topics  . Smoking status: Former Smoker -- 0.50 packs/day for 40 years    Types: Cigarettes  . Smokeless tobacco: Never Used  . Alcohol Use: Yes     Comment: 1-2 per month  . Drug Use: No  . Sexually Active: Not Currently   Other Topics Concern  . Not on file   Social History Narrative  . No narrative on file   Family History  Problem Relation Age of Onset  . Cervical cancer Daughter   . Heart disease Sister   . Heart disease Mother   . Heart disease Brother   . Colon cancer Father   . Stroke Sister   . Cancer Brother     esophageal  . Asthma Maternal Grandmother   .  Diabetes Mother   . Diabetes Brother   . Hypertension Mother   . Hypertension Sister   . Hypertension Brother   . Thyroid disease Sister   . Thyroid disease Daughter    Patient Active Problem List  Diagnosis  . Anemia  . Breast cancer  . Cataracts, bilateral  . Depression  . High cholesterol  . Goiter  . Glaucoma  . Former smoker  . Chronic pain following surgery or procedure  . Breast calcifications on mammogram  . Multiple thyroid nodules  . Family history of colon cancer  . DJD (degenerative joint disease), cervical  . HTN (hypertension)  . Other forms of retinal detachment  . Urinary incontinence, mixed  . Unspecified constipation   Current Outpatient Prescriptions on File Prior to Visit  Medication Sig Dispense Refill  . amLODipine (NORVASC) 5 MG tablet Take 1 tablet (5 mg total) by mouth daily.  90 tablet  1  . ibuprofen (ADVIL,MOTRIN) 800 MG tablet Take 1 tablet (800 mg total) by mouth every 6 (six) hours as needed.  90 tablet  0  . Iodine Strong, Lugols, (IODINE STRONG PO) Take 3 drops by mouth.      . latanoprost (XALATAN) 0.005 % ophthalmic solution       . Multiple Vitamin (MULTIVITAMIN) tablet Take 1 tablet by mouth daily.      . simvastatin (ZOCOR) 5 MG tablet Take 1 tablet (5 mg total) by mouth at bedtime.  90 tablet  0  . fish oil-omega-3 fatty acids 1000 MG capsule Take 1 g by mouth daily.       No current facility-administered medications on file prior to visit.       Review of Systems See HPI    Objective:   Physical Exam  Physical Exam  Vital signs and nursing note reviewed  Constitutional: She is oriented to person, place, and time. She appears well-developed and well-nourished. She is cooperative.  HENT:  Head: Normocephalic and atraumatic.  Right Ear: Tympanic membrane normal.  Left Ear: Tympanic membrane normal.  Nose: Nose normal.  Mouth/Throat: Oropharynx is clear and moist and mucous membranes are normal. No oropharyngeal exudate or  posterior oropharyngeal erythema.  Eyes: Conjunctivae and EOM are normal. Pupils are equal, round, and reactive to light.  Neck: Neck supple. No JVD present. Carotid bruit is not present. No mass and no thyromegaly present.  Cardiovascular: Regular rhythm, normal heart sounds, intact distal pulses and normal pulses.  Exam reveals no gallop and no friction rub.   No murmur heard. Pulses:      Dorsalis pedis pulses are 2+ on the right side, and 2+ on the left side.  Pulmonary/Chest: Breath sounds normal. She has no wheezes. She has no rhonchi. She has no rales. Right breast exhibits no mass, no nipple discharge and no skin change. Left breast exhibits no mass, no nipple discharge and no skin change.  Abdominal: Soft. Bowel sounds are normal. She exhibits no distension and no mass. There is no hepatosplenomegaly. There is no tenderness. There is no CVA tenderness.  Genitourinary: Rectum normal, vagina normal and uterus normal. Rectal exam shows no mass. Guaiac negative stool. No labial fusion. There is no lesion on the right labia. There is no lesion on the left labia. Cervix exhibits no motion tenderness. Right adnexum displays no mass, no tenderness and no fullness. Left adnexum displays no mass, no tenderness and no fullness. No erythema around the vagina.  Musculoskeletal:       No active synovitis to any joint.    Lymphadenopathy:       Right cervical: No superficial cervical adenopathy present.      Left cervical: No superficial cervical adenopathy present.       Right axillary: No pectoral and no lateral adenopathy present.       Left axillary: No pectoral and no lateral adenopathy present.      Right: No inguinal adenopathy present.       Left: No inguinal adenopathy present.  Neurological: She is alert and oriented to person, place, and time. She has normal strength and normal reflexes. No cranial nerve deficit or sensory deficit. She displays a negative Romberg sign. Coordination and gait  normal.  Skin: Skin is warm and dry. No abrasion, no bruising, no ecchymosis and no rash noted. No cyanosis. Nails show no clubbing.  Psychiatric: She has a normal mood and affect. Her speech is normal and behavior is normal.          Assessment & Plan:  Health Maintenance  Pap today She declines all vaccines.  Will schedule bone density  See scanned sheet  Will refer to GI for screening colonoscopy  HTN  Continue meds  Hyperlipidemia  On low dose Zocor DASH diet given will follow and recheck fasting in 6 months  Thyroid nodules  I encouraged pt at some point she should see the surgeon .  She has follow up with Dr. Talmage Nap in am  Breast cancer  Will refer to Dr. Myna Hidalgo  DJD  /chronic neck pain after laminectomy    Managed by nerurologist Dr. Maple Hudson  See me in 3 months or sooner as needed        Assessment & Plan:

## 2013-02-26 ENCOUNTER — Telehealth: Payer: Self-pay | Admitting: Hematology & Oncology

## 2013-02-26 NOTE — Telephone Encounter (Signed)
Pt aware of 5-16 appointment will get records from oncologist

## 2013-03-01 ENCOUNTER — Other Ambulatory Visit: Payer: Self-pay | Admitting: Lab

## 2013-03-01 ENCOUNTER — Other Ambulatory Visit: Payer: Self-pay | Admitting: Hematology & Oncology

## 2013-03-02 ENCOUNTER — Telehealth: Payer: Self-pay | Admitting: Internal Medicine

## 2013-03-02 NOTE — Telephone Encounter (Signed)
Pt needs the amLODipine (NORVASC) 5 MG tablet Refill before leaving out of town... She is not due to have it but needs the over ride per pharmacy downstairs... Please call pt at [phone]

## 2013-03-03 NOTE — Telephone Encounter (Signed)
Pt will have medication refilled on Monday 3/14

## 2013-03-05 ENCOUNTER — Other Ambulatory Visit: Payer: MEDICARE

## 2013-03-08 ENCOUNTER — Other Ambulatory Visit: Payer: MEDICARE

## 2013-03-09 ENCOUNTER — Encounter: Payer: Self-pay | Admitting: *Deleted

## 2013-03-09 ENCOUNTER — Encounter (INDEPENDENT_AMBULATORY_CARE_PROVIDER_SITE_OTHER): Payer: Self-pay

## 2013-03-10 ENCOUNTER — Encounter: Payer: Self-pay | Admitting: *Deleted

## 2013-03-12 ENCOUNTER — Telehealth: Payer: Self-pay | Admitting: Hematology & Oncology

## 2013-03-12 NOTE — Telephone Encounter (Signed)
MOVED 5-16 TO 5-23 SHE IS HAVING SURGERY 5-13

## 2013-03-12 NOTE — Telephone Encounter (Signed)
Left pt message we need to either move the time of her 5-16 appointment or change the day.

## 2013-03-15 ENCOUNTER — Ambulatory Visit (INDEPENDENT_AMBULATORY_CARE_PROVIDER_SITE_OTHER): Payer: Medicare Other | Admitting: Surgery

## 2013-03-15 ENCOUNTER — Encounter (INDEPENDENT_AMBULATORY_CARE_PROVIDER_SITE_OTHER): Payer: Self-pay | Admitting: Surgery

## 2013-03-15 VITALS — BP 128/76 | HR 64 | Temp 98.2°F | Resp 18 | Ht 64.0 in | Wt 172.0 lb

## 2013-03-15 DIAGNOSIS — E049 Nontoxic goiter, unspecified: Secondary | ICD-10-CM

## 2013-03-15 DIAGNOSIS — E042 Nontoxic multinodular goiter: Secondary | ICD-10-CM

## 2013-03-15 NOTE — Patient Instructions (Signed)

## 2013-03-15 NOTE — Progress Notes (Signed)
General Surgery [facility]  Chief Complaint  Patient presents with  . New Evaluation    Enlarging multinodular goiter - referral from Dr. Dorisann Frames and Dr. Raechel Chute    HISTORY: Patient is a 67 year old white female referred for evaluation of multinodular thyroid goiter. Patient was initially diagnosed in 1997. She has remained euthyroid and never required thyroid hormone. Over the past few years however there has been a persistent increase in the size of a dominant nodule in the right thyroid lobe. Recent ultrasound shows a dominant nodule in the right lobe measures 5.2 cm and the right lobe itself was enlarged and over 7 cm in length. Left lobe has a dominant nodule measuring 2.6 cm which is also recently increased in size. Patient has developed mild compressive symptoms with intermittent hoarseness, dysphagia, and discomfort. She has undergone previous anterior cervical fusion performed in [place], [place] in 2010.  There is a family history of hypothyroidism in the patient's sister and her daughter. There is no family history of thyroid malignancy. There is no family history of other endocrinopathy.  Past Medical History  Diagnosis Date  . Chronic back pain   . Chronic neck pain   . Abnormal Pap smear   . Post-menopausal   . Sexual dysfunction   . Urinary incontinence   . Anemia   . Breast cancer   . Cataracts, bilateral   . Depression   . High cholesterol   . Goiter      Current Outpatient Prescriptions  Medication Sig Dispense Refill  . amLODipine (NORVASC) 5 MG tablet Take 1 tablet (5 mg total) by mouth daily.  90 tablet  1  . gabapentin (NEURONTIN) 300 MG capsule Take 300 mg by mouth 3 (three) times daily.      Marland Kitchen latanoprost (XALATAN) 0.005 % ophthalmic solution       . Multiple Vitamin (MULTIVITAMIN) tablet Take 1 tablet by mouth daily.      . prednisoLONE acetate (PRED FORTE) 1 % ophthalmic suspension Place 1 drop into the right eye 2  (two) times daily.      . timolol (BETIMOL) 0.5 % ophthalmic solution Place 1 drop into the right eye 2 (two) times daily.      . fish oil-omega-3 fatty acids 1000 MG capsule Take 1 g by mouth daily.      Marland Kitchen ibuprofen (ADVIL,MOTRIN) 800 MG tablet Take 1 tablet (800 mg total) by mouth every 6 (six) hours as needed.  90 tablet  0  . Iodine Strong, Lugols, (IODINE STRONG PO) Take 3 drops by mouth.      . simvastatin (ZOCOR) 5 MG tablet Take 1 tablet (5 mg total) by mouth at bedtime.  90 tablet  0   No current facility-administered medications for this visit.     No Known Allergies   Family History  Problem Relation Age of Onset  . Cervical cancer Daughter   . Heart disease Sister   . Heart disease Mother   . Heart disease Brother   . Colon cancer Father   . Stroke Sister   . Cancer Brother     esophageal  . Asthma Maternal Grandmother   . Diabetes Mother   . Diabetes Brother   . Hypertension Mother   . Hypertension Sister   . Hypertension Brother   . Thyroid disease Sister   . Thyroid disease Daughter      History   Social History  . Marital Status: Single    Spouse  Name: N/A    Number of Children: N/A  . Years of Education: N/A   Occupational History  . retired    Social History Main Topics  . Smoking status: Former Smoker -- 0.50 packs/day for 40 years    Types: Cigarettes  . Smokeless tobacco: Never Used  . Alcohol Use: Yes     Comment: 1-2 per month  . Drug Use: No  . Sexually Active: Not Currently   Other Topics Concern  . Not on file   Social History Narrative  . No narrative on file     REVIEW OF SYSTEMS - PERTINENT POSITIVES ONLY: Denies tremor. Denies palpitations. Notes thinning hair. Notes fatigue. Mild dysphagia. Intermittent hoarseness.  EXAM: Filed Vitals:   03/15/13 0900  BP: 128/76  Pulse: 64  Temp: 98.2 F (36.8 C)  Resp: 18    HEENT: normocephalic; pupils equal and reactive; sclerae clear; dentition good; mucous membranes  moist NECK:  Well-healed surgical incision right anterior neck; palpation shows multiple thyroid nodules with dominant mass in the lower right thyroid lobe extending beneath the right clavicle; asymmetric on extension; no palpable anterior or posterior cervical lymphadenopathy; no supraclavicular masses; no tenderness CHEST: clear to auscultation bilaterally without rales, rhonchi, or wheezes CARDIAC: regular rate and rhythm without significant murmur; peripheral pulses are full EXT:  non-tender without edema; no deformity NEURO: no gross focal deficits; no sign of tremor   LABORATORY RESULTS: See [facility] (CHL-Epic) for most recent results   RADIOLOGY RESULTS: See [facility] (CHL-Epic) for most recent results   IMPRESSION: Enlarging multinodular goiter with mild compressive symptoms  PLAN: The patient and I discussed the above findings at length. She has undergone previous fine-needle aspiration biopsies performed in [place]. I do not have those results but presumed to be benign. Patient has also had nuclear scans which showed a dominant nodule on the right to be a cold nodule. After a lengthy discussion, I have recommended total thyroidectomy given the fact that she has bilateral thyroid nodules which have enlarged over the past year with sequential ultrasound scanning documentation.  Patient and I discussed the procedure of total thyroidectomy. We discussed the risk and benefits. I provided her with written literature to review. In particular we discussed the risk of injury to recurrent laryngeal nerves and the parathyroid glands. We discussed the hospital stay to be anticipated and her postoperative recovery. She understands and wishes to proceed.  The risks and benefits of the procedure have been discussed at length with the patient.  The patient understands the proposed procedure, potential alternative treatments, and the course of recovery to be expected.  All of the  patient's questions have been answered at this time.  The patient wishes to proceed with surgery.  Velora Heckler, MD, FACS General & Endocrine Surgery [facility]   Visit Diagnoses: 1. Multiple thyroid nodules   2. Goiter     Primary Care Physician: Levon Hedger, MD

## 2013-03-23 ENCOUNTER — Telehealth: Payer: Self-pay | Admitting: Hematology & Oncology

## 2013-03-23 NOTE — Telephone Encounter (Signed)
Pt cx 5-23 said was having surgery 6-10 and couldn't drive she wasn't sure for how long. She said she was in [place] selling her house and couldn't make it for 5-23 appointment. I asked her to call after her surgery so we could get and idea of when she would be able to drive so we don't have to change the appointment for a 3rd time.

## 2013-03-26 ENCOUNTER — Telehealth (INDEPENDENT_AMBULATORY_CARE_PROVIDER_SITE_OTHER): Payer: Self-pay

## 2013-03-26 NOTE — Telephone Encounter (Signed)
The pt called to see if she can be treated with Armour instead of Synthroid.  I told her it may be next week before she hears back.

## 2013-04-06 ENCOUNTER — Other Ambulatory Visit (HOSPITAL_COMMUNITY): Payer: MEDICARE

## 2013-04-07 ENCOUNTER — Encounter (HOSPITAL_COMMUNITY): Admission: RE | Payer: Self-pay | Source: Ambulatory Visit

## 2013-04-07 ENCOUNTER — Ambulatory Visit (HOSPITAL_COMMUNITY): Admission: RE | Admit: 2013-04-07 | Payer: MEDICARE | Source: Ambulatory Visit | Admitting: Otolaryngology

## 2013-04-07 SURGERY — THYROIDECTOMY
Anesthesia: General | Site: Neck | Laterality: Right

## 2013-04-09 ENCOUNTER — Ambulatory Visit: Payer: MEDICARE | Admitting: Hematology & Oncology

## 2013-04-09 ENCOUNTER — Other Ambulatory Visit: Payer: MEDICARE

## 2013-04-09 ENCOUNTER — Other Ambulatory Visit: Payer: MEDICARE | Admitting: Lab

## 2013-04-09 ENCOUNTER — Ambulatory Visit: Payer: MEDICARE

## 2013-04-16 ENCOUNTER — Other Ambulatory Visit: Payer: MEDICARE | Admitting: Lab

## 2013-04-16 ENCOUNTER — Encounter: Payer: MEDICARE | Admitting: Internal Medicine

## 2013-04-16 ENCOUNTER — Ambulatory Visit: Payer: MEDICARE

## 2013-04-16 ENCOUNTER — Ambulatory Visit: Payer: MEDICARE | Admitting: Hematology & Oncology

## 2013-04-20 ENCOUNTER — Encounter (HOSPITAL_COMMUNITY): Payer: Self-pay | Admitting: Pharmacy Technician

## 2013-04-27 ENCOUNTER — Encounter (HOSPITAL_COMMUNITY)
Admission: RE | Admit: 2013-04-27 | Discharge: 2013-04-27 | Disposition: A | Discharge status: Home / Self Care | Payer: MEDICARE | Source: Ambulatory Visit | Attending: Surgery | Admitting: Surgery

## 2013-04-27 ENCOUNTER — Telehealth (INDEPENDENT_AMBULATORY_CARE_PROVIDER_SITE_OTHER): Payer: Self-pay

## 2013-04-27 ENCOUNTER — Encounter (HOSPITAL_COMMUNITY): Payer: Self-pay

## 2013-04-27 ENCOUNTER — Ambulatory Visit (HOSPITAL_COMMUNITY)
Admission: RE | Admit: 2013-04-27 | Discharge: 2013-04-27 | Disposition: A | Discharge status: Home / Self Care | Payer: MEDICARE | Source: Ambulatory Visit | Attending: Surgery | Admitting: Surgery

## 2013-04-27 DIAGNOSIS — Z01818 Encounter for other preprocedural examination: Secondary | ICD-10-CM | POA: Insufficient documentation

## 2013-04-27 DIAGNOSIS — I10 Essential (primary) hypertension: Secondary | ICD-10-CM | POA: Insufficient documentation

## 2013-04-27 DIAGNOSIS — Z01812 Encounter for preprocedural laboratory examination: Secondary | ICD-10-CM | POA: Insufficient documentation

## 2013-04-27 DIAGNOSIS — R0602 Shortness of breath: Secondary | ICD-10-CM | POA: Insufficient documentation

## 2013-04-27 HISTORY — DX: Anesthesia of skin: R20.0

## 2013-04-27 HISTORY — DX: Other localized visual field defect, unspecified eye: H53.459

## 2013-04-27 HISTORY — DX: Shortness of breath: R06.02

## 2013-04-27 HISTORY — DX: Other skin changes: R23.8

## 2013-04-27 HISTORY — DX: Other complications of anesthesia, initial encounter: T88.59XA

## 2013-04-27 HISTORY — DX: Spontaneous ecchymoses: R23.3

## 2013-04-27 HISTORY — DX: Adverse effect of unspecified anesthetic, initial encounter: T41.45XA

## 2013-04-27 HISTORY — DX: Essential (primary) hypertension: I10

## 2013-04-27 HISTORY — DX: Unspecified osteoarthritis, unspecified site: M19.90

## 2013-04-27 HISTORY — DX: Frequency of micturition: R35.0

## 2013-04-27 LAB — BASIC METABOLIC PANEL
BUN: 9 mg/dL (ref 6–23)
CO2: 26 mEq/L (ref 19–32)
Calcium: 9.5 mg/dL (ref 8.4–10.5)
Chloride: 97 mEq/L (ref 96–112)
Creatinine, Ser: 0.52 mg/dL (ref 0.50–1.10)
GFR calc Af Amer: >90 mL/min (ref >90)
GFR calc non Af Amer: >90 mL/min (ref >90)
Glucose, Bld: 92 mg/dL (ref 70–99)
Potassium: 4.1 mEq/L (ref 3.5–5.1)
Sodium: 132 mEq/L — ABNORMAL LOW (ref 135–145)

## 2013-04-27 LAB — CBC
HCT: 37.4 % (ref 36.0–46.0)
Hemoglobin: 12.6 g/dL (ref 12.0–15.0)
MCH: 29.8 pg (ref 26.0–34.0)
MCHC: 33.7 g/dL (ref 30.0–36.0)
MCV: 88.4 fL (ref 78.0–100.0)
Platelets: 260 10*3/uL (ref 150–400)
RBC: 4.23 MIL/uL (ref 3.87–5.11)
RDW: 12.3 % (ref 11.5–15.5)
WBC: 7.9 10*3/uL (ref 4.0–10.5)

## 2013-04-27 LAB — SURGICAL PCR SCREEN
MRSA, PCR: NEGATIVE
Staphylococcus aureus: NEGATIVE

## 2013-04-27 NOTE — Telephone Encounter (Signed)
I called and notified the pt.  She asked how soon after surgery she will be started on something and I told her right after.  She is unclear on who will write it for her but I told her to check with Dr Talmage Nap.

## 2013-04-27 NOTE — Patient Instructions (Signed)
[name] [name]  04/27/2013                           YOUR PROCEDURE IS SCHEDULED ON:  05/04/13               PLEASE REPORT TO SHORT STAY CENTER AT :  5:15 AM               CALL THIS NUMBER IF ANY PROBLEMS THE DAY OF SURGERY :               [phone]                      REMEMBER:   Do not eat food or drink liquids AFTER MIDNIGHT  May have clear liquids UNTIL 6 HOURS BEFORE SURGERY  Clear liquids include soda, tea, black coffee, apple or grape juice, broth.  Take these medicines the morning of surgery with A SIP OF WATER: GABAPENTIN / TYLENOL / TRAMADOL IF NEEDED / USE EYEDROP AS USUAL   Do not wear jewelry, make-up   Do not wear lotions, powders, or perfumes.   Do not shave legs or underarms 12 hrs. before surgery (men may shave face)  Do not bring valuables to the hospital.  Contacts, dentures or bridgework may not be worn into surgery.  Leave suitcase in the car. After surgery it may be brought to your room.  For patients admitted to the hospital more than one night, checkout time is 11:00                          The day of discharge.   Patients discharged the day of surgery will not be allowed to drive home                             If going home same day of surgery, must have someone stay with you first                           24 hrs at home and arrange for some one to drive you home from hospital.    Special Instructions:   Please read over the following fact sheets that you were given:               1. MRSA  INFORMATION                      2. [place] PREPARING FOR SURGERY SHEET               3. STOP ALL ASPIRIN AND HERBAL MEDICATION 5 DAYS PREOP               4.BRING EYE DROPS TO HOSPITAL                                                X_____________________________________________________________________        Failure to follow these instructions may result in cancellation of your surgery

## 2013-04-27 NOTE — Telephone Encounter (Signed)
Message copied by Ivory Broad on Tue Apr 27, 2013 10:49 AM ------      Message from: [name] [name]      Created: Tue Apr 27, 2013 10:36 AM       Patient asking about type of thyroid hormone to be used after surgery.  She will need to address this with her endocrinologist, Dr. Sabino Gasser.            tmg ------

## 2013-04-28 ENCOUNTER — Ambulatory Visit (INDEPENDENT_AMBULATORY_CARE_PROVIDER_SITE_OTHER): Payer: MEDICARE | Admitting: Internal Medicine

## 2013-04-28 ENCOUNTER — Telehealth (INDEPENDENT_AMBULATORY_CARE_PROVIDER_SITE_OTHER): Payer: Self-pay

## 2013-04-28 VITALS — BP 111/69 | HR 67 | Temp 98.2°F | Resp 18 | Wt 175.0 lb

## 2013-04-28 DIAGNOSIS — I10 Essential (primary) hypertension: Secondary | ICD-10-CM

## 2013-04-28 DIAGNOSIS — E042 Nontoxic multinodular goiter: Secondary | ICD-10-CM

## 2013-04-28 MED ORDER — AMLODIPINE BESYLATE 2.5 MG PO TABS: ORAL_TABLET | ORAL | Status: DC

## 2013-04-28 NOTE — Progress Notes (Signed)
Subjective:    Patient ID: [name] [name], female    DOB: 24-Apr-1946, 67 y.o.   MRN: [id number]  HPI [name] [name] is here for evalution prior to surgery.  She is scheduled for thyroidectomy on 6/10 and her pre-op eval she was noted to have a BP of 92/65.  She is on Amlodipine 5 mg   She denies dizziness or near syncope symptoms.  She report she has changed her pain meds with D.r Maple Hudson, she is on high dose neurontin now but has stopped her Ultram  No Known Allergies Past Medical History  Diagnosis Date  . Chronic back pain   . Chronic neck pain   . Abnormal Pap smear   . Post-menopausal   . Sexual dysfunction   . Urinary incontinence   . Anemia   . Breast cancer   . Cataracts, bilateral   . Depression   . High cholesterol   . Goiter   . Complication of anesthesia     slow to wake up  . Hypertension   . Shortness of breath     with exertion  . Numbness     hands / feet /arms / legs - followed by Dr. Sol Passer - [place]  . Arthritis     back and neck  . Bruises easily   . Frequency of urination   . Abnormal peripheral vision     RT EYE   Past Surgical History  Procedure Laterality Date  . Lumpectomy  1997    Rt  . Cataract extraction, bilateral  [id number]  . Laparoscopy  1978    BTL  . Neck fusion  2010  . Biopsy thyroid    . Breast surgery    . Eye surgery  08/2012    detached retina surg   History   Social History  . Marital Status: Single    Spouse Name: N/A    Number of Children: N/A  . Years of Education: N/A   Occupational History  . retired    Social History Main Topics  . Smoking status: Former Smoker -- 0.50 packs/day for 40 years    Types: Cigarettes    Quit date: 04/28/1967  . Smokeless tobacco: Never Used  . Alcohol Use: Yes     Comment: 1-2 per month  . Drug Use: No  . Sexually Active: Not Currently   Other Topics Concern  . Not on file   Social History Narrative  . No narrative on file   Family History  Problem Relation Age of Onset   . Cervical cancer Daughter   . Heart disease Sister   . Heart disease Mother   . Heart disease Brother   . Colon cancer Father   . Stroke Sister   . Cancer Brother     esophageal  . Asthma Maternal Grandmother   . Diabetes Mother   . Diabetes Brother   . Hypertension Mother   . Hypertension Sister   . Hypertension Brother   . Thyroid disease Sister   . Thyroid disease Daughter    Patient Active Problem List   Diagnosis Date Noted  . Other forms of retinal detachment 01/18/2013  . Urinary incontinence, mixed 01/18/2013  . Unspecified constipation 01/18/2013  . HTN (hypertension) 12/28/2012  . Family history of colon cancer 05/30/2012  . DJD (degenerative joint disease), cervical 05/30/2012  . Multiple thyroid nodules 04/06/2012  . Glaucoma 03/19/2012  . Former smoker 03/19/2012  . Chronic pain following surgery or procedure 03/19/2012  . Breast  calcifications on mammogram 03/19/2012  . Anemia   . Breast cancer   . Cataracts, bilateral   . Depression   . High cholesterol   . Goiter    Current Outpatient Prescriptions on File Prior to Visit  Medication Sig Dispense Refill  . acetaminophen (TYLENOL) 500 MG tablet Take 500 mg by mouth every 6 (six) hours as needed for pain.      . dorzolamide-timolol (COSOPT) 22.3-6.8 MG/ML ophthalmic solution Place 1 drop into the right eye 2 (two) times daily.      . fish oil-omega-3 fatty acids 1000 MG capsule Take 2 g by mouth daily.      Marland Kitchen gabapentin (NEURONTIN) 300 MG capsule Take 300 mg by mouth 3 (three) times daily.      . hydrocortisone cream 1 % Apply 1 application topically daily as needed (for itching skin).      Marland Kitchen ibuprofen (ADVIL,MOTRIN) 800 MG tablet Take 1 tablet (800 mg total) by mouth every 6 (six) hours as needed.  90 tablet  0  . latanoprost (XALATAN) 0.005 % ophthalmic solution Place 1 drop into both eyes at bedtime.       . Multiple Vitamins-Minerals (HAIR/SKIN/NAILS) TABS Take 1 tablet by mouth daily.      .  polyvinyl alcohol (LIQUIFILM TEARS) 1.4 % ophthalmic solution Place 1 drop into both eyes daily as needed (for dry eyes).      . traMADol (ULTRAM) 50 MG tablet Take 50 mg by mouth 2 (two) times daily as needed for pain.      . vitamin B-12 (CYANOCOBALAMIN) 1000 MCG tablet Take 1,000 mcg by mouth daily.       No current facility-administered medications on file prior to visit.       Review of Systems See HPI    Objective:   Physical Exam  Physical Exam  Nursing note and vitals reviewed.   Repeat BP my exam  122/80 Constitutional: She is oriented to person, place, and time. She appears well-developed and well-nourished.  HENT:  Head: Normocephalic and atraumatic.  Cardiovascular: Normal rate and regular rhythm. Exam reveals no gallop and no friction rub.  No murmur heard.  Pulmonary/Chest: Breath sounds normal. She has no wheezes. She has no rales.  Neurological: She is alert and oriented to person, place, and time.  Skin: Skin is warm and dry.  Psychiatric: She has a normal mood and affect. Her behavior is normal.             Assessment & Plan:  HTN:  Will lower dose of Amlodipine to 2.5 mg. [name] [name] is to hold her amlodipine 3 days prior to surgery and is to see me on June 17th .   She is to hold her BP med until I see her on the 17th.      Meds ordered today

## 2013-04-28 NOTE — Progress Notes (Signed)
04/27/13 I spoke with Dr. Council Mechanic concerning pts BP / HR he advised me to have pt contact her PCP tomorrow (04-28-13) to ask about need for Norvasc and pt was told not to take evening dose and not to take dose the night before surgery.  Pt agreed to call her MD in the morning. Pt stated "I had low BP when I was younger"

## 2013-04-28 NOTE — Patient Instructions (Addendum)
See me on June 17th  Hold BP med ication after your surgery until I see you on the 17th

## 2013-04-28 NOTE — Telephone Encounter (Signed)
Message copied by Joanette Gula on Wed Apr 28, 2013  8:59 AM ------      Message from: Velora Heckler      Created: Tue Apr 27, 2013 10:36 AM       Patient asking about type of thyroid hormone to be used after surgery.  She will need to address this with her endocrinologist, Dr. Sabino Gasser.            tmg ------

## 2013-05-03 NOTE — Anesthesia Preprocedure Evaluation (Addendum)
Anesthesia Evaluation  Patient identified by MRN, date of birth, ID band Patient awake    Reviewed: Allergy & Precautions, H&P , NPO status , Patient's Chart, lab work & pertinent test results, reviewed documented beta blocker date and time   Airway Mallampati: II TM Distance: >3 FB Neck ROM: full    Dental no notable dental hx. (+) Teeth Intact and Dental Advisory Given   Pulmonary neg pulmonary ROS, shortness of breath and with exertion,  breath sounds clear to auscultation  Pulmonary exam normal       Cardiovascular Exercise Tolerance: Good hypertension, Pt. on medications Rhythm:regular Rate:Normal     Neuro/Psych Depression mumbness hands/ feet/ arms/ legs s/p cervical fusion. negative neurological ROS  negative psych ROS   GI/Hepatic negative GI ROS, Neg liver ROS,   Endo/Other  negative endocrine ROS  Renal/GU negative Renal ROS  negative genitourinary   Musculoskeletal   Abdominal   Peds  Hematology negative hematology ROS (+)   Anesthesia Other Findings   Reproductive/Obstetrics negative OB ROS                          Anesthesia Physical Anesthesia Plan  ASA: II  Anesthesia Plan: General   Post-op Pain Management:    Induction: Intravenous  Airway Management Planned: Oral ETT  Additional Equipment:   Intra-op Plan:   Post-operative Plan: Extubation in OR  Informed Consent: I have reviewed the patients History and Physical, chart, labs and discussed the procedure including the risks, benefits and alternatives for the proposed anesthesia with the patient or authorized representative who has indicated his/her understanding and acceptance.   Dental Advisory Given  Plan Discussed with: CRNA and Surgeon  Anesthesia Plan Comments:        Anesthesia Quick Evaluation

## 2013-05-04 ENCOUNTER — Inpatient Hospital Stay (HOSPITAL_COMMUNITY): Payer: MEDICARE | Admitting: Anesthesiology

## 2013-05-04 ENCOUNTER — Encounter (HOSPITAL_COMMUNITY): Payer: Self-pay | Admitting: *Deleted

## 2013-05-04 ENCOUNTER — Observation Stay (HOSPITAL_COMMUNITY)
Admission: RE | Admit: 2013-05-04 | Discharge: 2013-05-05 | Disposition: A | Discharge status: Home / Self Care | Payer: MEDICARE | Source: Ambulatory Visit | Attending: Surgery | Admitting: Surgery

## 2013-05-04 ENCOUNTER — Encounter (HOSPITAL_COMMUNITY)
Admission: RE | Disposition: A | Discharge status: Home / Self Care | Payer: Self-pay | Source: Ambulatory Visit | Attending: Surgery

## 2013-05-04 ENCOUNTER — Encounter (HOSPITAL_COMMUNITY): Payer: Self-pay | Admitting: Anesthesiology

## 2013-05-04 DIAGNOSIS — I10 Essential (primary) hypertension: Secondary | ICD-10-CM | POA: Insufficient documentation

## 2013-05-04 DIAGNOSIS — E049 Nontoxic goiter, unspecified: Secondary | ICD-10-CM | POA: Diagnosis present

## 2013-05-04 DIAGNOSIS — E042 Nontoxic multinodular goiter: Secondary | ICD-10-CM

## 2013-05-04 DIAGNOSIS — E063 Autoimmune thyroiditis: Secondary | ICD-10-CM | POA: Insufficient documentation

## 2013-05-04 DIAGNOSIS — Z79899 Other long term (current) drug therapy: Secondary | ICD-10-CM | POA: Insufficient documentation

## 2013-05-04 HISTORY — PX: THYROIDECTOMY: SHX17

## 2013-05-04 LAB — GLUCOSE, CAPILLARY: Glucose-Capillary: 140 mg/dL — ABNORMAL HIGH (ref 70–99)

## 2013-05-04 SURGERY — THYROIDECTOMY
Anesthesia: General | Site: Neck | Wound class: Clean

## 2013-05-04 MED ORDER — LIDOCAINE HCL (CARDIAC) 20 MG/ML IV SOLN
INTRAVENOUS | Status: DC | PRN
  Administered 2013-05-04: 60 mg via INTRAVENOUS

## 2013-05-04 MED ORDER — LACTATED RINGERS IV SOLN
INTRAVENOUS | Status: DC | PRN
  Administered 2013-05-04 (×2): via INTRAVENOUS

## 2013-05-04 MED ORDER — AMLODIPINE BESYLATE 2.5 MG PO TABS
2.5000 mg | ORAL_TABLET | Freq: Every day | ORAL | Status: DC
  Filled 2013-05-04 (×2): qty 1

## 2013-05-04 MED ORDER — MIDAZOLAM HCL 5 MG/5ML IJ SOLN
INTRAMUSCULAR | Status: DC | PRN
  Administered 2013-05-04: 2 mg via INTRAVENOUS

## 2013-05-04 MED ORDER — HYDROCODONE-ACETAMINOPHEN 5-325 MG PO TABS
1.0000 | ORAL_TABLET | ORAL | Status: DC | PRN
  Administered 2013-05-04 – 2013-05-05 (×2): 1 via ORAL
  Administered 2013-05-05: 2 via ORAL
  Administered 2013-05-05: 1 via ORAL
  Filled 2013-05-04: qty 1
  Filled 2013-05-04 (×3): qty 2

## 2013-05-04 MED ORDER — CEFAZOLIN SODIUM-DEXTROSE 2-3 GM-% IV SOLR
2.0000 g | INTRAVENOUS | Status: AC
Start: 2013-05-04 — End: 2013-05-04
  Administered 2013-05-04: 2 g via INTRAVENOUS

## 2013-05-04 MED ORDER — ONDANSETRON HCL 4 MG PO TABS
4.0000 mg | ORAL_TABLET | Freq: Four times a day (QID) | ORAL | Status: DC | PRN
  Administered 2013-05-05: 4 mg via ORAL
  Filled 2013-05-04: qty 1

## 2013-05-04 MED ORDER — HYDROMORPHONE HCL PF 1 MG/ML IJ SOLN
0.2500 mg | INTRAMUSCULAR | Status: DC | PRN
  Administered 2013-05-04 (×4): 0.5 mg via INTRAVENOUS

## 2013-05-04 MED ORDER — EPHEDRINE SULFATE 50 MG/ML IJ SOLN
INTRAMUSCULAR | Status: DC | PRN
  Administered 2013-05-04: 10 mg via INTRAVENOUS
  Administered 2013-05-04: 5 mg via INTRAVENOUS

## 2013-05-04 MED ORDER — ACETAMINOPHEN 10 MG/ML IV SOLN
INTRAVENOUS | Status: DC | PRN
  Administered 2013-05-04: 1000 mg via INTRAVENOUS

## 2013-05-04 MED ORDER — DORZOLAMIDE HCL-TIMOLOL MAL 2-0.5 % OP SOLN
1.0000 [drp] | Freq: Two times a day (BID) | OPHTHALMIC | Status: DC
  Administered 2013-05-04 – 2013-05-05 (×2): 1 [drp] via OPHTHALMIC
  Filled 2013-05-04: qty 10

## 2013-05-04 MED ORDER — SUFENTANIL CITRATE 50 MCG/ML IV SOLN
INTRAVENOUS | Status: DC | PRN
  Administered 2013-05-04: 5 ug via INTRAVENOUS
  Administered 2013-05-04 (×3): 10 ug via INTRAVENOUS
  Administered 2013-05-04: 5 ug via INTRAVENOUS

## 2013-05-04 MED ORDER — DEXAMETHASONE SODIUM PHOSPHATE 10 MG/ML IJ SOLN
INTRAMUSCULAR | Status: DC | PRN
  Administered 2013-05-04: 10 mg via INTRAVENOUS

## 2013-05-04 MED ORDER — ROCURONIUM BROMIDE 100 MG/10ML IV SOLN
INTRAVENOUS | Status: DC | PRN
  Administered 2013-05-04: 35 mg via INTRAVENOUS
  Administered 2013-05-04: 5 mg via INTRAVENOUS

## 2013-05-04 MED ORDER — GLYCOPYRROLATE 0.2 MG/ML IJ SOLN
INTRAMUSCULAR | Status: DC | PRN
  Administered 2013-05-04: 0.2 mg via INTRAVENOUS
  Administered 2013-05-04: .4 mg via INTRAVENOUS

## 2013-05-04 MED ORDER — PHENYLEPHRINE HCL 10 MG/ML IJ SOLN
INTRAMUSCULAR | Status: DC | PRN
  Administered 2013-05-04: 80 ug via INTRAVENOUS

## 2013-05-04 MED ORDER — LATANOPROST 0.005 % OP SOLN
1.0000 [drp] | Freq: Every day | OPHTHALMIC | Status: DC
  Administered 2013-05-04: 1 [drp] via OPHTHALMIC
  Filled 2013-05-04: qty 2.5

## 2013-05-04 MED ORDER — ONDANSETRON HCL 4 MG/2ML IJ SOLN
4.0000 mg | Freq: Four times a day (QID) | INTRAMUSCULAR | Status: DC | PRN
  Administered 2013-05-04 (×2): 4 mg via INTRAVENOUS
  Filled 2013-05-04 (×2): qty 2

## 2013-05-04 MED ORDER — SUCCINYLCHOLINE CHLORIDE 20 MG/ML IJ SOLN
INTRAMUSCULAR | Status: DC | PRN
  Administered 2013-05-04: 100 mg via INTRAVENOUS

## 2013-05-04 MED ORDER — PROMETHAZINE HCL 25 MG/ML IJ SOLN: 25.0000 mg | Freq: Four times a day (QID) | INTRAMUSCULAR | Status: DC | PRN

## 2013-05-04 MED ORDER — NEOSTIGMINE METHYLSULFATE 1 MG/ML IJ SOLN
INTRAMUSCULAR | Status: DC | PRN
  Administered 2013-05-04: 3 mg via INTRAVENOUS

## 2013-05-04 MED ORDER — LACTATED RINGERS IV SOLN: INTRAVENOUS | Status: DC

## 2013-05-04 MED ORDER — ATROPINE SULFATE 0.4 MG/ML IJ SOLN
INTRAMUSCULAR | Status: DC | PRN
  Administered 2013-05-04: 0.4 mg via INTRAVENOUS

## 2013-05-04 MED ORDER — PROPOFOL 10 MG/ML IV BOLUS
INTRAVENOUS | Status: DC | PRN
  Administered 2013-05-04: 160 mg via INTRAVENOUS
  Administered 2013-05-04: 30 mg via INTRAVENOUS

## 2013-05-04 MED ORDER — 0.9 % SODIUM CHLORIDE (POUR BTL) OPTIME
TOPICAL | Status: DC | PRN
  Administered 2013-05-04: 1000 mL

## 2013-05-04 MED ORDER — HYDROMORPHONE HCL PF 1 MG/ML IJ SOLN
1.0000 mg | INTRAMUSCULAR | Status: DC | PRN
  Administered 2013-05-04: 0.5 mg via INTRAVENOUS
  Filled 2013-05-04: qty 1

## 2013-05-04 MED ORDER — KCL IN DEXTROSE-NACL 30-5-0.45 MEQ/L-%-% IV SOLN
INTRAVENOUS | Status: DC
  Administered 2013-05-04 – 2013-05-05 (×2): via INTRAVENOUS
  Filled 2013-05-04 (×3): qty 1000

## 2013-05-04 MED ORDER — TRAMADOL HCL 50 MG PO TABS
50.0000 mg | ORAL_TABLET | Freq: Four times a day (QID) | ORAL | Status: DC | PRN
  Administered 2013-05-05: 50 mg via ORAL
  Filled 2013-05-04: qty 1

## 2013-05-04 MED ORDER — CALCIUM CARBONATE 1250 (500 CA) MG PO TABS
1250.0000 mg | ORAL_TABLET | Freq: Three times a day (TID) | ORAL | Status: DC
  Administered 2013-05-04 – 2013-05-05 (×3): 1250 mg via ORAL
  Filled 2013-05-04 (×5): qty 1

## 2013-05-04 MED ORDER — ONDANSETRON HCL 4 MG/2ML IJ SOLN
INTRAMUSCULAR | Status: DC | PRN
  Administered 2013-05-04: 4 mg via INTRAVENOUS

## 2013-05-04 MED ORDER — GABAPENTIN 300 MG PO CAPS
300.0000 mg | ORAL_CAPSULE | Freq: Three times a day (TID) | ORAL | Status: DC
  Administered 2013-05-04 – 2013-05-05 (×2): 300 mg via ORAL
  Filled 2013-05-04 (×5): qty 1

## 2013-05-04 MED ORDER — LIDOCAINE HCL 4 % MT SOLN
OROMUCOSAL | Status: DC | PRN
  Administered 2013-05-04: 4 mL via TOPICAL

## 2013-05-04 MED ORDER — ACETAMINOPHEN 500 MG PO TABS
500.0000 mg | ORAL_TABLET | ORAL | Status: DC | PRN
  Administered 2013-05-04 (×2): 500 mg via ORAL
  Filled 2013-05-04: qty 1
  Filled 2013-05-04: qty 2

## 2013-05-04 SURGICAL SUPPLY — 39 items
ATTRACTOMAT 16X20 MAGNETIC DRP (DRAPES) ×2 IMPLANT
BENZOIN TINCTURE PRP APPL 2/3 (GAUZE/BANDAGES/DRESSINGS) ×2 IMPLANT
BLADE HEX COATED 2.75 (ELECTRODE) ×2 IMPLANT
BLADE SURG 15 STRL LF DISP TIS (BLADE) ×1 IMPLANT
BLADE SURG 15 STRL SS (BLADE) ×1
CANISTER SUCTION 2500CC (MISCELLANEOUS) ×2 IMPLANT
CHLORAPREP W/TINT 10.5 ML (MISCELLANEOUS) ×2 IMPLANT
CLIP TI MEDIUM 6 (CLIP) ×6 IMPLANT
CLIP TI WIDE RED SMALL 6 (CLIP) ×8 IMPLANT
CLOSURE STERI-STRIP 1/4X4 (GAUZE/BANDAGES/DRESSINGS) ×2 IMPLANT
CLOTH BEACON ORANGE TIMEOUT ST (SAFETY) ×2 IMPLANT
DISSECTOR ROUND CHERRY 3/8 STR (MISCELLANEOUS) IMPLANT
DRAPE PED LAPAROTOMY (DRAPES) ×2 IMPLANT
DRESSING SURGICEL FIBRLLR 1X2 (HEMOSTASIS) ×1 IMPLANT
DRSG SURGICEL FIBRILLAR 1X2 (HEMOSTASIS) ×2
ELECT REM PT RETURN 9FT ADLT (ELECTROSURGICAL) ×2
ELECTRODE REM PT RTRN 9FT ADLT (ELECTROSURGICAL) ×1 IMPLANT
GAUZE SPONGE 4X4 16PLY XRAY LF (GAUZE/BANDAGES/DRESSINGS) ×2 IMPLANT
GLOVE SURG ORTHO 8.0 STRL STRW (GLOVE) ×2 IMPLANT
GOWN STRL NON-REIN LRG LVL3 (GOWN DISPOSABLE) ×2 IMPLANT
GOWN STRL REIN XL XLG (GOWN DISPOSABLE) ×4 IMPLANT
KIT BASIN OR (CUSTOM PROCEDURE TRAY) ×2 IMPLANT
NS IRRIG 1000ML POUR BTL (IV SOLUTION) ×2 IMPLANT
PACK BASIC VI WITH GOWN DISP (CUSTOM PROCEDURE TRAY) ×2 IMPLANT
PENCIL BUTTON HOLSTER BLD 10FT (ELECTRODE) ×2 IMPLANT
SHEARS HARMONIC 9CM CVD (BLADE) ×2 IMPLANT
SPONGE GAUZE 4X4 12PLY (GAUZE/BANDAGES/DRESSINGS) ×2 IMPLANT
STAPLER VISISTAT 35W (STAPLE) ×2 IMPLANT
STRIP CLOSURE SKIN 1/2X4 (GAUZE/BANDAGES/DRESSINGS) ×2 IMPLANT
SUT MNCRL AB 4-0 PS2 18 (SUTURE) ×2 IMPLANT
SUT SILK 2 0 (SUTURE) ×1
SUT SILK 2-0 18XBRD TIE 12 (SUTURE) ×1 IMPLANT
SUT SILK 3 0 (SUTURE)
SUT SILK 3-0 18XBRD TIE 12 (SUTURE) IMPLANT
SUT VIC AB 3-0 SH 18 (SUTURE) ×2 IMPLANT
SYR BULB IRRIGATION 50ML (SYRINGE) ×2 IMPLANT
TAPE CLOTH SURG 4X10 WHT LF (GAUZE/BANDAGES/DRESSINGS) ×2 IMPLANT
TOWEL OR 17X26 10 PK STRL BLUE (TOWEL DISPOSABLE) ×2 IMPLANT
YANKAUER SUCT BULB TIP 10FT TU (MISCELLANEOUS) ×2 IMPLANT

## 2013-05-04 NOTE — H&P (Signed)
[name] [name] is an 67 y.o. female.    General Surgery [facility]  Chief Complaint: bilateral thyroid nodules with compressive symptoms  HPI: Patient is a 67 year old white female referred for evaluation of multinodular thyroid goiter. Patient was initially diagnosed in 1997. She has remained euthyroid and never required thyroid hormone. Over the past few years however there has been a persistent increase in the size of a dominant nodule in the right thyroid lobe. Recent ultrasound shows a dominant nodule in the right lobe measures 5.2 cm and the right lobe itself was enlarged and over 7 cm in length. Left lobe has a dominant nodule measuring 2.6 cm which is also recently increased in size. Patient has developed mild compressive symptoms with intermittent hoarseness, dysphagia, and discomfort. She has undergone previous anterior cervical fusion performed in [place], [place] in 2010.  There is a family history of hypothyroidism in the patient's sister and her daughter. There is no family history of thyroid malignancy. There is no family history of other endocrinopathy.   Past Medical History  Diagnosis Date  . Chronic back pain   . Chronic neck pain   . Abnormal Pap smear   . Post-menopausal   . Sexual dysfunction   . Urinary incontinence   . Anemia   . Breast cancer   . Cataracts, bilateral   . Depression   . High cholesterol   . Goiter   . Complication of anesthesia     slow to wake up  . Hypertension   . Shortness of breath     with exertion  . Numbness     hands / feet /arms / legs - followed by Dr. Sol Passer [place]  . Arthritis     back and neck  . Bruises easily   . Frequency of urination   . Abnormal peripheral vision     RT EYE    Past Surgical History  Procedure Laterality Date  . Lumpectomy  1997    Rt  . Cataract extraction, bilateral  [id number]  . Laparoscopy  1978    BTL  . Neck fusion  2010  . Biopsy thyroid    . Breast surgery     . Eye surgery  08/2012    detached retina surg    Family History  Problem Relation Age of Onset  . Cervical cancer Daughter   . Heart disease Sister   . Heart disease Mother   . Heart disease Brother   . Colon cancer Father   . Stroke Sister   . Cancer Brother     esophageal  . Asthma Maternal Grandmother   . Diabetes Mother   . Diabetes Brother   . Hypertension Mother   . Hypertension Sister   . Hypertension Brother   . Thyroid disease Sister   . Thyroid disease Daughter    Social History:  reports that she quit smoking about 46 years ago. Her smoking use included Cigarettes. She has a 20 pack-year smoking history. She has never used smokeless tobacco. She reports that  drinks alcohol. She reports that she does not use illicit drugs.  Allergies: No Known Allergies  Medications Prior to Admission  Medication Sig Dispense Refill  . acetaminophen (TYLENOL) 500 MG tablet Take 500 mg by mouth every 6 (six) hours as needed for pain.      . dorzolamide-timolol (COSOPT) 22.3-6.8 MG/ML ophthalmic solution Place 1 drop into the right eye 2 (two) times daily.      Marland Kitchen  fish oil-omega-3 fatty acids 1000 MG capsule Take 2 g by mouth daily.      Marland Kitchen gabapentin (NEURONTIN) 300 MG capsule Take 300 mg by mouth 3 (three) times daily.      . hydrocortisone cream 1 % Apply 1 application topically daily as needed (for itching skin).      Marland Kitchen ibuprofen (ADVIL,MOTRIN) 800 MG tablet Take 1 tablet (800 mg total) by mouth every 6 (six) hours as needed.  90 tablet  0  . latanoprost (XALATAN) 0.005 % ophthalmic solution Place 1 drop into both eyes at bedtime.       . Multiple Vitamins-Minerals (HAIR/SKIN/NAILS) TABS Take 1 tablet by mouth daily.      . polyvinyl alcohol (LIQUIFILM TEARS) 1.4 % ophthalmic solution Place 1 drop into both eyes daily as needed (for dry eyes).      . traMADol (ULTRAM) 50 MG tablet Take 50 mg by mouth 2 (two) times daily as needed for pain.      . vitamin B-12 (CYANOCOBALAMIN) 1000  MCG tablet Take 1,000 mcg by mouth daily.      Marland Kitchen amLODipine (NORVASC) 2.5 MG tablet Take one tablet daily  30 tablet  1    No results found for this or any previous visit (from the past 48 hour(s)). No results found.  Review of Systems  Constitutional: Negative.   HENT:       Dysphagia  Eyes: Negative.   Respiratory: Negative.   Cardiovascular: Negative.   Gastrointestinal: Negative.   Genitourinary: Negative.   Musculoskeletal: Negative.   Skin: Negative.   Neurological: Negative.   Endo/Heme/Allergies: Negative.   Psychiatric/Behavioral: Negative.     Blood pressure 158/69, pulse 56, temperature 97.9 F (36.6 C), temperature source Oral, resp. rate 18, SpO2 99.00%. Physical Exam  Constitutional: She is oriented to person, place, and time. She appears well-developed and well-nourished. No distress.  HENT:  Head: Normocephalic and atraumatic.  Right Ear: External ear normal.  Left Ear: External ear normal.  Eyes: Conjunctivae are normal. Pupils are equal, round, and reactive to light. No scleral icterus.  Neck: Neck supple. No tracheal deviation present. Thyromegaly (bilateral thyroid nodules) present.  Well healed anterior incision  Cardiovascular: Normal rate, regular rhythm and normal heart sounds.   No murmur heard. Respiratory: Effort normal and breath sounds normal. She has no wheezes.  GI: Soft. Bowel sounds are normal. She exhibits no distension. There is no tenderness.  Musculoskeletal: Normal range of motion. She exhibits no edema.  Lymphadenopathy:    She has no cervical adenopathy.  Neurological: She is alert and oriented to person, place, and time.  Skin: Skin is warm and dry.  Psychiatric: She has a normal mood and affect. Her behavior is normal.     Assessment/Plan Multinodular thyroid goiter with compressive symptoms  Plan total thyroidectomy  The risks and benefits of the procedure have been discussed at length with the patient.  The patient  understands the proposed procedure, potential alternative treatments, and the course of recovery to be expected.  All of the patient's questions have been answered at this time.  The patient wishes to proceed with surgery.  Velora Heckler, MD, [facility] Office: [phone]    Laurena Valko M 05/04/2013, 7:19 AM

## 2013-05-04 NOTE — Op Note (Signed)
[name] [name], [name] [name] NO.:  [id number]  MEDICAL RECORD NO.:  [id number]  LOCATION:  1535                         FACILITY:  [facility]  PHYSICIAN:  Velora Heckler, MD      DATE OF BIRTH:  1946/11/02  DATE OF PROCEDURE:  05/04/2013                               OPERATIVE REPORT   PREOPERATIVE DIAGNOSIS:  Multinodular thyroid goiter with compressive symptoms.  POSTOPERATIVE DIAGNOSIS:  Multinodular thyroid goiter with compressive symptoms.  PROCEDURE:  Total thyroidectomy.  SURGEON:  Velora Heckler, MD, FACS  ASSISTANT:  Luretha Murphy, MD, FACS  ANESTHESIA:  General per Dr. Ronelle Nigh.  ESTIMATED BLOOD LOSS:  Minimal.  PREPARATION:  ChloraPrep.  COMPLICATIONS:  None.  INDICATIONS:  The patient is a 67 year old white female with multinodular thyroid goiter.  She was originally diagnosed in 1997. There has been a persistent increase in the dominant nodule in the inferior right thyroid lobe.  It now measures 5.2 cm in maximum dimension.  There was a dominant nodule in the superior pole of the left thyroid lobe measuring 2.6 cm in maximum dimension.  The patient has developed mild compressive symptoms including intermittent hoarseness, dysphagia, and discomfort.  She has had a previous anterior cervical fusion in 2010.  The patient now comes to Surgery for total thyroidectomy.  BODY OF REPORT:  Procedures done in OR #[facility] at [facility].  The patient was brought to the operating room, placed in supine position on the operating room table.  Following administration of general anesthesia, the patient was positioned and then prepped and draped in the usual strict aseptic fashion.  After ascertaining that an adequate level of anesthesia had been achieved, a Kocher incision was made with a #15 blade.  Dissection was carried through subcutaneous tissues and platysma.  Hemostasis was obtained with electrocautery. Skin flaps were elevated  cephalad and caudad from the thyroid notch to the sternal notch.  A Mahorner self-retaining retractor was placed for exposure.  Strap muscles were incised in the midline and dissection was begun on the left.  Left thyroid lobe was exposed.  Middle thyroid vein was divided between Ligaclips with the Harmonic scalpel.  Gland was gently mobilized.  Inferior venous tributaries were divided between small and medium Ligaclips with the Harmonic scalpel.  Parathyroid tissue was identified and preserved.  Superior pole vessels were dissected out individually and divided between small and medium Ligaclips with the Harmonic scalpel.  There was a dominant nodule at occupies the superior portion of the lobe.  This was rolled anteriorly. Branches of the inferior thyroid artery were divided between small Ligaclips.  Recurrent laryngeal nerve was identified and preserved. Ligament of Allyson Sabal was released with the electrocautery and the gland was mobilized up and onto the anterior trachea.  Isthmus was mobilized across the midline.  There was a moderate-sized pyramidal lobe, which was dissected off of the anterior thyroid cartilage and resected en bloc with the isthmus.  Dry pack was placed in the left neck.  Next, we turned our attention to the right thyroid lobe.  Again strap muscles were reflected laterally exposing an enlarged right thyroid lobe.  There was a dominant nodule  in the inferior pole extending into the anterior mediastinum.  Middle thyroid vein was divided between Ligaclips with the Harmonic scalpel.  There was a moderate amount of scar tissue around the mid and upper portion of the lobe consistent with previous spine fusion surgery.  Right lobe was gently mobilized.  Venous tributaries were divided between Ligaclips.  Superior pole vessels were dissected out individually and divided between small and medium Ligaclips with the Harmonic scalpel.  Recurrent laryngeal nerve was identified  and preserved.  Inferior pole was mobilized out of the anterior mediastinum with blunt dissection and delivered into the neck.  Branches of the inferior thyroid artery were divided between small and medium Ligaclips with the Harmonic scalpel.  Ligament of Allyson Sabal was released with the electrocautery and a small remnant of thyroid tissue was left adjacent to the recurrent nerve on the right.  Remainder of the gland was excised off the anterior trachea.  Single suture was used to mark the right superior pole.  A small double suture was used to mark the left superior pole.  The entire thyroid gland was submitted to Pathology for review.  The neck was irrigated with warm saline and hemostasis was achieved with small Ligaclips.  Fibrillar was placed throughout the operative field. Strap muscles were reapproximated in the midline with interrupted 3-0 Vicryl sutures.  Platysma was closed with interrupted 3-0 Vicryl sutures.  Skin was closed with a running 4-0 Monocryl subcuticular suture.  Wound was washed and dried and benzoin and Steri-Strips were applied.  Sterile dressings were applied.  The patient was awakened from anesthesia and brought to the recovery room.  The patient tolerated the procedure well.   Velora Heckler, MD, FACS General & Endocrine Surgery [facility] Office: [phone]   TMG/MEDQ  D:  05/04/2013  T:  05/04/2013  Job:  [id number]  cc:   Dorisann Frames, M.D. Fax: [phone]  Kendrick Ranch, M.D.

## 2013-05-04 NOTE — Brief Op Note (Signed)
05/04/2013  9:45 AM  PATIENT:  [name] [name]  67 y.o. female  PRE-OPERATIVE DIAGNOSIS:  multinoduler goiter with compressive symptoms  POST-OPERATIVE DIAGNOSIS:  same   PROCEDURE:  Procedure(s):  TOTAL THYROIDECTOMY (N/A)  SURGEON:  Surgeon(s) and Role:    * Velora Heckler, MD - Primary    * Valarie Merino, MD - Assisting  ANESTHESIA:   general  EBL:  Total I/O In: 100 [I.V.:100] Out: 25 [Blood:25]  BLOOD ADMINISTERED:none  DRAINS: none   LOCAL MEDICATIONS USED:  NONE  SPECIMEN:  Excision  DISPOSITION OF SPECIMEN:  PATHOLOGY  COUNTS:  YES  TOURNIQUET:  * No tourniquets in log *  DICTATION: .Other Dictation: Dictation Number [id number]  PLAN OF CARE: Admit for overnight observation  PATIENT DISPOSITION:  PACU - hemodynamically stable.   Delay start of Pharmacological VTE agent (>24hrs) due to surgical blood loss or risk of bleeding: yes  Velora Heckler, MD, FACS General & Endocrine Surgery [facility] Office: [phone]

## 2013-05-04 NOTE — Progress Notes (Signed)
Patient with c/o N/V. MD notified. Orders received. Pt offered new medication ordered for N/V, pt stated that she was feeling much better.

## 2013-05-04 NOTE — Anesthesia Postprocedure Evaluation (Signed)
  Anesthesia Post-op Note  Patient: [name] [name]  Procedure(s) Performed: Procedure(s) (LRB):  TOTAL THYROIDECTOMY (N/A)  Patient Location: PACU  Anesthesia Type: General  Level of Consciousness: awake and alert   Airway and Oxygen Therapy: Patient Spontanous Breathing  Post-op Pain: mild  Post-op Assessment: Post-op Vital signs reviewed, Patient's Cardiovascular Status Stable, Respiratory Function Stable, Patent Airway and No signs of Nausea or vomiting  Last Vitals:  Filed Vitals:   05/04/13 1350  BP: 155/85  Pulse: 51  Temp: 35.8 C  Resp: 18    Post-op Vital Signs: stable   Complications: No apparent anesthesia complications

## 2013-05-04 NOTE — Transfer of Care (Signed)
Immediate Anesthesia Transfer of Care Note  Patient: [name] [name]  Procedure(s) Performed: Procedure(s):  TOTAL THYROIDECTOMY (N/A)  Patient Location: PACU  Anesthesia Type:General  Level of Consciousness: awake, alert , oriented and patient cooperative  Airway & Oxygen Therapy: Patient Spontanous Breathing and Patient connected to face mask oxygen  Post-op Assessment: Report given to PACU RN, Post -op Vital signs reviewed and stable and Patient moving all extremities X 4  Post vital signs: stable  Complications: No apparent anesthesia complications

## 2013-05-05 ENCOUNTER — Encounter (HOSPITAL_COMMUNITY): Payer: Self-pay | Admitting: Surgery

## 2013-05-05 LAB — BASIC METABOLIC PANEL
BUN: 6 mg/dL (ref 6–23)
CO2: 27 mEq/L (ref 19–32)
Calcium: 8.8 mg/dL (ref 8.4–10.5)
Chloride: 91 mEq/L — ABNORMAL LOW (ref 96–112)
Creatinine, Ser: 0.51 mg/dL (ref 0.50–1.10)
GFR calc Af Amer: >90 mL/min (ref >90)
GFR calc non Af Amer: >90 mL/min (ref >90)
Glucose, Bld: 145 mg/dL — ABNORMAL HIGH (ref 70–99)
Potassium: 3 mEq/L — ABNORMAL LOW (ref 3.5–5.1)
Sodium: 129 mEq/L — ABNORMAL LOW (ref 135–145)

## 2013-05-05 LAB — GLUCOSE, CAPILLARY
Glucose-Capillary: 136 mg/dL — ABNORMAL HIGH (ref 70–99)
Glucose-Capillary: 142 mg/dL — ABNORMAL HIGH (ref 70–99)

## 2013-05-05 MED ORDER — PANTOPRAZOLE SODIUM 40 MG PO TBEC
40.0000 mg | DELAYED_RELEASE_TABLET | ORAL | Status: AC
  Administered 2013-05-05: 40 mg via ORAL
  Filled 2013-05-05: qty 1

## 2013-05-05 MED ORDER — HYDROCODONE-ACETAMINOPHEN 5-325 MG PO TABS: 1.0000 | ORAL_TABLET | ORAL | Status: DC | PRN

## 2013-05-05 MED ORDER — CALCIUM CARBONATE 1250 (500 CA) MG PO TABS: 2.0000 | ORAL_TABLET | Freq: Two times a day (BID) | ORAL | Status: DC

## 2013-05-05 MED ORDER — ALUM & MAG HYDROXIDE-SIMETH 200-200-20 MG/5ML PO SUSP
30.0000 mL | ORAL | Status: AC
  Administered 2013-05-05: 30 mL via ORAL
  Filled 2013-05-05: qty 30

## 2013-05-05 NOTE — Progress Notes (Signed)
MD notified of K+ level this am. No new orders received. Pt discharged home with daughter in stable condition. Discharge instructions and scripts given. Pt verbalized understanding.

## 2013-05-05 NOTE — Care Management Note (Signed)
    Page 1 of 1   05/05/2013     11:01:14 AM   CARE MANAGEMENT NOTE 05/05/2013  Patient:  [name] [name]   Account Number:  [id number]  Date Initiated:  05/05/2013  Documentation initiated by:  [name] [name]  Subjective/Objective Assessment:   67 yo female admitted s/p total thyroidectomy. PTA lived at home alone.     Action/Plan:   Home when stable   Anticipated DC Date:  05/05/2013   Anticipated DC Plan:  HOME/SELF CARE      DC Planning Services  CM consult      Choice offered to / List presented to:             Status of service:  Completed, signed off Medicare Important Message given?   (If response is "NO", the following Medicare IM given date fields will be blank) Date Medicare IM given:   Date Additional Medicare IM given:    Discharge Disposition:  HOME/SELF CARE  Per UR Regulation:  Reviewed for med. necessity/level of care/duration of stay  If discussed at Long Length of Stay Meetings, dates discussed:    Comments:

## 2013-05-05 NOTE — Discharge Summary (Signed)
Physician Discharge Summary [facility]  Patient ID: [name] [name] MRN: [id number] DOB/AGE: 1946/11/23 67 y.o.  Admit date: 05/04/2013 Discharge date: 05/05/2013  Admission Diagnoses:  Multinodular thyroid goiter with compressive symptoms  Discharge Diagnoses:  Principal Problem:   Multiple thyroid nodules Active Problems:   Goiter   Discharged Condition: good  Hospital Course: patient admitted for observation after total thyroidectomy.  Post op course complicated by acid reflux and pain.  Improving on POD#1.  Rx given for reflux symptoms.  Calcium level stable at 8.8 on POD#1.  Plan discharge home after lunch meal.  Consults: None  Significant Diagnostic Studies: labs: calcium  Treatments: surgery: total thyroidectomy  Discharge Exam: Blood pressure 147/82, pulse 60, temperature 97.8 F (36.6 C), temperature source Oral, resp. rate 16, height 5\' 4"  (1.626 m), weight 175 lb 5 oz (79.52 kg), SpO2 98.00%. HEENT - clear Neck - wound clear and dry, voice normal, minimal STS Chest - clear  Disposition: Home with family  Discharge Orders   Future Appointments Provider Department Dept Phone   05/11/2013 9:45 AM Kendrick Ranch, MD [facility] [phone]   05/18/2013 9:30 AM Velora Heckler, MD [facility], [place] [phone]   Future Orders Complete By Expires     Apply dressing  As directed     Scheduling Instructions:      Apply light gauze dressing to wound before discharge home today.    Diet - low sodium heart healthy  As directed     Discharge instructions  As directed     Comments:      THYROID & PARATHYROID SURGERY - POST OP INSTRUCTIONS  Always review your discharge instruction sheet from the facility where your surgery was performed.  A prescription for pain medication may be given to you upon discharge.  Take your pain medication as prescribed.  If narcotic pain medicine is not needed, then you may take  acetaminophen (Tylenol) or ibuprofen (Advil) as needed.  Take your usually prescribed medications unless otherwise directed.  If you need a refill on your pain medication, please contact your pharmacy. They will contact our office to request authorization.  Prescriptions will not be processed after 5 pm or on weekends.  Start with a light diet upon arrival home, such as soup and crackers or toast.  Be sure to drink plenty of fluids daily.  Resume your normal diet the day after surgery.  Most patients will experience some swelling and bruising on the chest and neck area.  Ice packs will help.  Swelling and bruising can take several days to resolve.   It is common to experience some constipation if taking pain medication after surgery.  Increasing fluid intake and taking a stool softener will usually help or prevent this problem.  A mild laxative (Milk of Magnesia or Miralax) should be taken according to package directions if there are no bowel movements after 48 hours.  You may remove your bandages 24-48 hours after surgery, and you may shower at that time.  You have steri-strips (small skin tapes) in place directly over the incision.  These strips should be left on the skin for 7-10 days and then removed.  You may resume regular (light) daily activities beginning the next day-such as daily self-care, walking, climbing stairs-gradually increasing activities as tolerated.  You may have sexual intercourse when it is comfortable.  Refrain from any heavy lifting or straining until approved by your doctor.  You may drive when you  no longer are taking prescription pain medication, you can comfortably wear a seatbelt, and you can safely maneuver your car and apply brakes.  You should see your doctor in the office for a follow-up appointment approximately two to three weeks after your surgery.  Make sure that you call for this appointment within a day or two after you arrive home to insure a convenient  appointment time.  WHEN TO CALL YOUR DOCTOR: -- Fever greater than 101.5 -- Inability to urinate -- Nausea and/or vomiting - persistent -- Extreme swelling or bruising -- Continued bleeding from incision -- Increased pain, redness, or drainage from the incision -- Difficulty swallowing or breathing -- Muscle cramping or spasms -- Numbness or tingling in hands or around lips  The clinic staff is available to answer your questions during regular business hours.  Please don't hesitate to call and ask to speak to one of the nurses if you have concerns.  Velora Heckler, MD, FACS General & Endocrine Surgery [facility] Office: [phone]    Increase activity slowly  As directed     Remove dressing in 24 hours  As directed         Medication List    TAKE these medications       acetaminophen 500 MG tablet  Commonly known as:  TYLENOL  Take 500 mg by mouth every 6 (six) hours as needed for pain.     amLODipine 2.5 MG tablet  Commonly known as:  NORVASC  Take one tablet daily     calcium carbonate 1250 MG tablet  Commonly known as:  OS-CAL - dosed in mg of elemental calcium  Take 2 tablets (1,000 mg of elemental calcium total) by mouth 2 (two) times daily.     dorzolamide-timolol 22.3-6.8 MG/ML ophthalmic solution  Commonly known as:  COSOPT  Place 1 drop into the right eye 2 (two) times daily.     fish oil-omega-3 fatty acids 1000 MG capsule  Take 2 g by mouth daily.     gabapentin 300 MG capsule  Commonly known as:  NEURONTIN  Take 300 mg by mouth 3 (three) times daily.     HAIR/SKIN/NAILS Tabs  Take 1 tablet by mouth daily.     HYDROcodone-acetaminophen 5-325 MG per tablet  Commonly known as:  NORCO/VICODIN  Take 1-2 tablets by mouth every 4 (four) hours as needed for pain.     hydrocortisone cream 1 %  Apply 1 application topically daily as needed (for itching skin).     ibuprofen 800 MG tablet  Commonly known as:  ADVIL,MOTRIN  Take 1  tablet (800 mg total) by mouth every 6 (six) hours as needed.     latanoprost 0.005 % ophthalmic solution  Commonly known as:  XALATAN  Place 1 drop into both eyes at bedtime.     polyvinyl alcohol 1.4 % ophthalmic solution  Commonly known as:  LIQUIFILM TEARS  Place 1 drop into both eyes daily as needed (for dry eyes).     traMADol 50 MG tablet  Commonly known as:  ULTRAM  Take 50 mg by mouth 2 (two) times daily as needed for pain.     vitamin B-12 1000 MCG tablet  Commonly known as:  CYANOCOBALAMIN  Take 1,000 mcg by mouth daily.         Velora Heckler, MD, [facility] Office: [phone]   Signed: Velora Heckler 05/05/2013, 7:49 AM

## 2013-05-06 ENCOUNTER — Other Ambulatory Visit (INDEPENDENT_AMBULATORY_CARE_PROVIDER_SITE_OTHER): Payer: Self-pay

## 2013-05-06 ENCOUNTER — Telehealth (INDEPENDENT_AMBULATORY_CARE_PROVIDER_SITE_OTHER): Payer: Self-pay

## 2013-05-06 NOTE — Progress Notes (Signed)
Quick Note:  Please contact patient and notify of benign pathology results.  Toniette Devera M. Kerah Hardebeck, MD, FACS [facility] Office: [phone]   ______ 

## 2013-05-06 NOTE — Telephone Encounter (Signed)
Per Dr Ardine Eng request pt notified of path result and lab slip mailed to pt.

## 2013-05-11 ENCOUNTER — Telehealth: Payer: Self-pay | Admitting: Internal Medicine

## 2013-05-11 ENCOUNTER — Emergency Department (HOSPITAL_BASED_OUTPATIENT_CLINIC_OR_DEPARTMENT_OTHER): Payer: MEDICARE

## 2013-05-11 ENCOUNTER — Encounter (HOSPITAL_BASED_OUTPATIENT_CLINIC_OR_DEPARTMENT_OTHER): Payer: Self-pay | Admitting: *Deleted

## 2013-05-11 ENCOUNTER — Emergency Department (HOSPITAL_BASED_OUTPATIENT_CLINIC_OR_DEPARTMENT_OTHER)
Admission: EM | Admit: 2013-05-11 | Discharge: 2013-05-11 | Disposition: A | Discharge status: Home / Self Care | Payer: MEDICARE | Attending: Emergency Medicine | Admitting: Emergency Medicine

## 2013-05-11 ENCOUNTER — Ambulatory Visit (INDEPENDENT_AMBULATORY_CARE_PROVIDER_SITE_OTHER): Payer: MEDICARE | Admitting: Internal Medicine

## 2013-05-11 ENCOUNTER — Encounter: Payer: Self-pay | Admitting: Internal Medicine

## 2013-05-11 VITALS — BP 182/86 | HR 50 | Resp 14 | Ht 64.0 in | Wt 175.0 lb

## 2013-05-11 DIAGNOSIS — I498 Other specified cardiac arrhythmias: Secondary | ICD-10-CM | POA: Insufficient documentation

## 2013-05-11 DIAGNOSIS — Z853 Personal history of malignant neoplasm of breast: Secondary | ICD-10-CM | POA: Insufficient documentation

## 2013-05-11 DIAGNOSIS — R001 Bradycardia, unspecified: Secondary | ICD-10-CM

## 2013-05-11 DIAGNOSIS — R5383 Other fatigue: Secondary | ICD-10-CM | POA: Insufficient documentation

## 2013-05-11 DIAGNOSIS — Z8639 Personal history of other endocrine, nutritional and metabolic disease: Secondary | ICD-10-CM | POA: Insufficient documentation

## 2013-05-11 DIAGNOSIS — Z79899 Other long term (current) drug therapy: Secondary | ICD-10-CM | POA: Insufficient documentation

## 2013-05-11 DIAGNOSIS — Z862 Personal history of diseases of the blood and blood-forming organs and certain disorders involving the immune mechanism: Secondary | ICD-10-CM | POA: Insufficient documentation

## 2013-05-11 DIAGNOSIS — I10 Essential (primary) hypertension: Secondary | ICD-10-CM

## 2013-05-11 DIAGNOSIS — E876 Hypokalemia: Secondary | ICD-10-CM

## 2013-05-11 DIAGNOSIS — Z780 Asymptomatic menopausal state: Secondary | ICD-10-CM | POA: Insufficient documentation

## 2013-05-11 DIAGNOSIS — Z8669 Personal history of other diseases of the nervous system and sense organs: Secondary | ICD-10-CM | POA: Insufficient documentation

## 2013-05-11 DIAGNOSIS — R5381 Other malaise: Secondary | ICD-10-CM | POA: Insufficient documentation

## 2013-05-11 DIAGNOSIS — E871 Hypo-osmolality and hyponatremia: Secondary | ICD-10-CM

## 2013-05-11 DIAGNOSIS — R55 Syncope and collapse: Secondary | ICD-10-CM

## 2013-05-11 DIAGNOSIS — E7800 Pure hypercholesterolemia, unspecified: Secondary | ICD-10-CM | POA: Insufficient documentation

## 2013-05-11 DIAGNOSIS — F329 Major depressive disorder, single episode, unspecified: Secondary | ICD-10-CM | POA: Insufficient documentation

## 2013-05-11 DIAGNOSIS — F3289 Other specified depressive episodes: Secondary | ICD-10-CM | POA: Insufficient documentation

## 2013-05-11 DIAGNOSIS — G8929 Other chronic pain: Secondary | ICD-10-CM | POA: Insufficient documentation

## 2013-05-11 DIAGNOSIS — Z139 Encounter for screening, unspecified: Secondary | ICD-10-CM

## 2013-05-11 DIAGNOSIS — Z87891 Personal history of nicotine dependence: Secondary | ICD-10-CM | POA: Insufficient documentation

## 2013-05-11 DIAGNOSIS — Z8659 Personal history of other mental and behavioral disorders: Secondary | ICD-10-CM | POA: Insufficient documentation

## 2013-05-11 DIAGNOSIS — Z8739 Personal history of other diseases of the musculoskeletal system and connective tissue: Secondary | ICD-10-CM | POA: Insufficient documentation

## 2013-05-11 LAB — CBC WITH DIFFERENTIAL/PLATELET
Basophils Absolute: 0 10*3/uL (ref 0.0–0.1)
Basophils Relative: 0 % (ref 0–1)
Eosinophils Absolute: 0.4 10*3/uL (ref 0.0–0.7)
Eosinophils Relative: 5 % (ref 0–5)
HCT: 37.2 % (ref 36.0–46.0)
Hemoglobin: 13 g/dL (ref 12.0–15.0)
Lymphocytes Relative: 34 % (ref 12–46)
Lymphs Abs: 2.8 10*3/uL (ref 0.7–4.0)
MCH: 31.1 pg (ref 26.0–34.0)
MCHC: 34.9 g/dL (ref 30.0–36.0)
MCV: 89 fL (ref 78.0–100.0)
Monocytes Absolute: 0.8 10*3/uL (ref 0.1–1.0)
Monocytes Relative: 10 % (ref 3–12)
Neutro Abs: 4.1 10*3/uL (ref 1.7–7.7)
Neutrophils Relative %: 51 % (ref 43–77)
Platelets: 291 10*3/uL (ref 150–400)
RBC: 4.18 MIL/uL (ref 3.87–5.11)
RDW: 12.2 % (ref 11.5–15.5)
WBC: 8 10*3/uL (ref 4.0–10.5)

## 2013-05-11 LAB — URINALYSIS, ROUTINE W REFLEX MICROSCOPIC
Bilirubin Urine: NEGATIVE
Glucose, UA: NEGATIVE mg/dL
Hgb urine dipstick: NEGATIVE
Ketones, ur: NEGATIVE mg/dL
Leukocytes, UA: NEGATIVE
Nitrite: NEGATIVE
Protein, ur: NEGATIVE mg/dL
Specific Gravity, Urine: 1.006 (ref 1.005–1.030)
Urobilinogen, UA: 0.2 mg/dL (ref 0.0–1.0)
pH: 7.5 (ref 5.0–8.0)

## 2013-05-11 LAB — COMPREHENSIVE METABOLIC PANEL
ALT: 16 U/L (ref 0–35)
AST: 16 U/L (ref 0–37)
Albumin: 3.9 g/dL (ref 3.5–5.2)
Alkaline Phosphatase: 78 U/L (ref 39–117)
BUN: 11 mg/dL (ref 6–23)
CO2: 27 mEq/L (ref 19–32)
Calcium: 9.9 mg/dL (ref 8.4–10.5)
Chloride: 98 mEq/L (ref 96–112)
Creatinine, Ser: 0.6 mg/dL (ref 0.50–1.10)
GFR calc Af Amer: >90 mL/min (ref >90)
GFR calc non Af Amer: >90 mL/min (ref >90)
Glucose, Bld: 93 mg/dL (ref 70–99)
Potassium: 4 mEq/L (ref 3.5–5.1)
Sodium: 135 mEq/L (ref 135–145)
Total Bilirubin: 0.3 mg/dL (ref 0.3–1.2)
Total Protein: 7.3 g/dL (ref 6.0–8.3)

## 2013-05-11 MED ORDER — IBUPROFEN 600 MG PO TABS: 600.0000 mg | ORAL_TABLET | Freq: Three times a day (TID) | ORAL | Status: DC | PRN

## 2013-05-11 NOTE — Progress Notes (Addendum)
Subjective:    Patient ID: [name] [name], female    DOB: 1946-08-12, 67 y.o.   MRN: [id number]  HPI  [name] [name] is here for follow up.  She is S/P total thyroidectomy 6 days ago.    Her BP was low pre-op and she has been instructed to hold her amlodipine.   Since discharge she has been having worsening dizziness and incisional pain .  She was sent home on supplemental calcium and T3 but she has only been using Motrin for her pain  See discharge labs.  She has not been taking any supplemental K.    She had a near syncopal episode when checking orthostatics in my office  See EKG.  Sinus bradycardia with rate in the low 40's.  No cardiac symptoms since discharge  No Known Allergies Past Medical History  Diagnosis Date  . Chronic back pain   . Chronic neck pain   . Abnormal Pap smear   . Post-menopausal   . Sexual dysfunction   . Urinary incontinence   . Anemia   . Breast cancer   . Cataracts, bilateral   . Depression   . High cholesterol   . Goiter   . Complication of anesthesia     slow to wake up  . Hypertension   . Shortness of breath     with exertion  . Numbness     hands / feet /arms / legs - [place] by Dr. Sol Passer [place]  . Arthritis     back and neck  . Bruises easily   . Frequency of urination   . Abnormal peripheral vision     RT EYE   Past Surgical History  Procedure Laterality Date  . Lumpectomy  1997    Rt  . Cataract extraction, bilateral  [id number]  . Laparoscopy  1978    BTL  . Neck fusion  2010  . Biopsy thyroid    . Breast surgery    . Eye surgery  08/2012    detached retina surg  . Thyroidectomy N/A 05/04/2013    Procedure:  TOTAL THYROIDECTOMY;  Surgeon: Velora Heckler, MD;  Location: [facility];  Service: General;  Laterality: N/A;   History   Social History  . Marital Status: Single    Spouse Name: N/A    Number of Children: N/A  . Years of Education: N/A   Occupational History  . retired    Social History Main Topics  . Smoking  status: Former Smoker -- 0.50 packs/day for 40 years    Types: Cigarettes    Quit date: 04/28/1967  . Smokeless tobacco: Never Used  . Alcohol Use: Yes     Comment: 1-2 per month  . Drug Use: No  . Sexually Active: Not Currently   Other Topics Concern  . Not on file   Social History Narrative  . No narrative on file   Family History  Problem Relation Age of Onset  . Cervical cancer Daughter   . Heart disease Sister   . Heart disease Mother   . Heart disease Brother   . Colon cancer Father   . Stroke Sister   . Cancer Brother     esophageal  . Asthma Maternal Grandmother   . Diabetes Mother   . Diabetes Brother   . Hypertension Mother   . Hypertension Sister   . Hypertension Brother   . Thyroid disease Sister   . Thyroid disease Daughter    Patient Active Problem List  Diagnosis Date Noted  . Other forms of retinal detachment 01/18/2013  . Urinary incontinence, mixed 01/18/2013  . Unspecified constipation 01/18/2013  . HTN (hypertension) 12/28/2012  . Family history of colon cancer 05/30/2012  . DJD (degenerative joint disease), cervical 05/30/2012  . Multiple thyroid nodules 04/06/2012  . Glaucoma 03/19/2012  . Former smoker 03/19/2012  . Chronic pain following surgery or procedure 03/19/2012  . Breast calcifications on mammogram 03/19/2012  . Anemia   . Breast cancer   . Cataracts, bilateral   . Depression   . High cholesterol   . Goiter    Current Outpatient Prescriptions on File Prior to Visit  Medication Sig Dispense Refill  . acetaminophen (TYLENOL) 500 MG tablet Take 500 mg by mouth every 6 (six) hours as needed for pain.      . calcium carbonate (OS-CAL - DOSED IN MG OF ELEMENTAL CALCIUM) 1250 MG tablet Take 2 tablets (1,000 mg of elemental calcium total) by mouth 2 (two) times daily.  60 tablet  1  . dorzolamide-timolol (COSOPT) 22.3-6.8 MG/ML ophthalmic solution Place 1 drop into the right eye 2 (two) times daily.      Marland Kitchen gabapentin (NEURONTIN) 300  MG capsule Take 300 mg by mouth 3 (three) times daily.      Marland Kitchen HYDROcodone-acetaminophen (NORCO/VICODIN) 5-325 MG per tablet Take 1-2 tablets by mouth every 4 (four) hours as needed for pain.  20 tablet  1  . hydrocortisone cream 1 % Apply 1 application topically daily as needed (for itching skin).      Marland Kitchen ibuprofen (ADVIL,MOTRIN) 800 MG tablet Take 1 tablet (800 mg total) by mouth every 6 (six) hours as needed.  90 tablet  0  . latanoprost (XALATAN) 0.005 % ophthalmic solution Place 1 drop into both eyes at bedtime.       . Multiple Vitamins-Minerals (HAIR/SKIN/NAILS) TABS Take 1 tablet by mouth daily.      . polyvinyl alcohol (LIQUIFILM TEARS) 1.4 % ophthalmic solution Place 1 drop into both eyes daily as needed (for dry eyes).      . vitamin B-12 (CYANOCOBALAMIN) 1000 MCG tablet Take 1,000 mcg by mouth daily.      Marland Kitchen amLODipine (NORVASC) 2.5 MG tablet Take one tablet daily  30 tablet  1  . fish oil-omega-3 fatty acids 1000 MG capsule Take 2 g by mouth daily.      . traMADol (ULTRAM) 50 MG tablet Take 50 mg by mouth 2 (two) times daily as needed for pain.       No current facility-administered medications on file prior to visit.        Review of Systems See HPI    Objective:   Physical Exam Physical Exam  Nursing note and vitals reviewed.  Constitutional: She is oriented to person, place, and time. She appears well-developed and well-nourished.  HENT:  Head: Normocephalic and atraumatic.  Neck postoperative edema at incision Cardiovascular: Normal rate and regular rhythm. Exam reveals no gallop and no friction rub.  No murmur heard.  Pulmonary/Chest: Breath sounds normal. She has no wheezes. She has no rales.  Neurological: She is alert and oriented to person, place, and time.  Skin: Skin is warm and dry.  Ext   No edema Psychiatric: She has a normal mood and affect. Her behavior is normal.              Assessment & Plan:  Electrolyte abnormalities  Pt hyponatremic and  hypokalemic along with marked sinus bradycardia.  Will need further  ER eval with stat labs.  Further management/ hospitalization  based on results  Near syncope in office  Hypertension  Will need restart BP meds.  After ER eval  Pt transported to ER via wheelchair  Addendum  Spoke with ER MD  All labs look normal now,  Hr in er 50's to low 60's  Will resume amlodipine and follow next week with covering Md

## 2013-05-11 NOTE — Patient Instructions (Signed)
To ER via wheelchairt

## 2013-05-11 NOTE — Telephone Encounter (Signed)
Notified pt of appt with Dr Alberteen Sam on Wed 6/25 at 945

## 2013-05-11 NOTE — ED Notes (Signed)
Pt sent from dr Lynnae Prude for evaluation since she was bradycardic in office this am pt was discharged from [facility] with a low potassium and sodium was given calcium. Wants labs drawn and pt evaluated pt feeling weak and tired also a little lightheaded.

## 2013-05-11 NOTE — Telephone Encounter (Signed)
Spoke with pt.    She will be seen next week by Dr. Alberteen Sam.  She is to taek 2.5 mg of amlodipine daily.  She states appetitie is good

## 2013-05-11 NOTE — ED Provider Notes (Signed)
History     CSN: [id number]  Arrival date & time 05/11/13  1113   First MD Initiated Contact with Patient 05/11/13 1114      Chief Complaint  Patient presents with  . sent from dr Lynnae Prude     recent thyroidectomy    (Consider location/radiation/quality/duration/timing/severity/associated sxs/prior treatment) HPI Comments: Patient sent here by her doctor after recent thyroidectomy do to bradycardia and hypertension. Patient has new specific symptoms but just states since the surgery 7 days ago she's had generalized weakness, fatigue and malaise. She denies any nausea, vomiting, abdominal pain, chest pain, fever, shortness of breath or cough. New muscle cramps or lower extremity edema.  States she's eating and drinking normally and started having bowel movements again several days ago.   Per the patient prior to surgery she had abnormal labs with a sodium of 129 and a potassium of 3.0. At a time they were not corrected and her doctor wanted him rechecked. She also states since surgery 7 days ago she has not taken any blood pressure medication. She been on amlodipine pravastatin amount of time and has not taken any blood pressure medication for the last 8 days.   The history is provided by the patient.    Past Medical History  Diagnosis Date  . Chronic back pain   . Chronic neck pain   . Abnormal Pap smear   . Post-menopausal   . Sexual dysfunction   . Urinary incontinence   . Anemia   . Breast cancer   . Cataracts, bilateral   . Depression   . High cholesterol   . Goiter   . Complication of anesthesia     slow to wake up  . Hypertension   . Shortness of breath     with exertion  . Numbness     hands / feet /arms / legs - followed by Dr. Sol Passer - [place]  . Arthritis     back and neck  . Bruises easily   . Frequency of urination   . Abnormal peripheral vision     RT EYE    Past Surgical History  Procedure Laterality Date  . Lumpectomy  1997    Rt  . Cataract  extraction, bilateral  [id number]  . Laparoscopy  1978    BTL  . Neck fusion  2010  . Biopsy thyroid    . Breast surgery    . Eye surgery  08/2012    detached retina surg  . Thyroidectomy N/A 05/04/2013    Procedure:  TOTAL THYROIDECTOMY;  Surgeon: Velora Heckler, MD;  Location: [facility];  Service: General;  Laterality: N/A;    Family History  Problem Relation Age of Onset  . Cervical cancer Daughter   . Heart disease Sister   . Heart disease Mother   . Heart disease Brother   . Colon cancer Father   . Stroke Sister   . Cancer Brother     esophageal  . Asthma Maternal Grandmother   . Diabetes Mother   . Diabetes Brother   . Hypertension Mother   . Hypertension Sister   . Hypertension Brother   . Thyroid disease Sister   . Thyroid disease Daughter     History  Substance Use Topics  . Smoking status: Former Smoker -- 0.50 packs/day for 40 years    Types: Cigarettes    Quit date: 04/28/1967  . Smokeless tobacco: Never Used  . Alcohol Use: Yes     Comment: 1-2  per month    OB History   Grav Para Term Preterm Abortions TAB SAB Ect Mult Living   3 3 3       3       Review of Systems  Constitutional: Negative for fever.  Respiratory: Negative for cough, shortness of breath and wheezing.   Cardiovascular: Negative for chest pain and palpitations.  Gastrointestinal: Negative for nausea, abdominal pain and diarrhea.  Genitourinary: Negative for dysuria.  All other systems reviewed and are negative.    Allergies  Review of patient's allergies indicates no known allergies.  Home Medications   Current Outpatient Rx  Name  Route  Sig  Dispense  Refill  . acetaminophen (TYLENOL) 500 MG tablet   Oral   Take 500 mg by mouth every 6 (six) hours as needed for pain.         Marland Kitchen amLODipine (NORVASC) 2.5 MG tablet      Take one tablet daily   30 tablet   1   . ARMOUR THYROID 30 MG tablet      30 mg.         . calcium carbonate (OS-CAL - DOSED IN MG OF ELEMENTAL  CALCIUM) 1250 MG tablet   Oral   Take 2 tablets (1,000 mg of elemental calcium total) by mouth 2 (two) times daily.   60 tablet   1   . dorzolamide-timolol (COSOPT) 22.3-6.8 MG/ML ophthalmic solution   Right Eye   Place 1 drop into the right eye 2 (two) times daily.         . fish oil-omega-3 fatty acids 1000 MG capsule   Oral   Take 2 g by mouth daily.         Marland Kitchen gabapentin (NEURONTIN) 300 MG capsule   Oral   Take 300 mg by mouth 3 (three) times daily.         Marland Kitchen HYDROcodone-acetaminophen (NORCO/VICODIN) 5-325 MG per tablet   Oral   Take 1-2 tablets by mouth every 4 (four) hours as needed for pain.   20 tablet   1   . hydrocortisone cream 1 %   Topical   Apply 1 application topically daily as needed (for itching skin).         Marland Kitchen ibuprofen (ADVIL,MOTRIN) 800 MG tablet   Oral   Take 1 tablet (800 mg total) by mouth every 6 (six) hours as needed.   90 tablet   0   . latanoprost (XALATAN) 0.005 % ophthalmic solution   Both Eyes   Place 1 drop into both eyes at bedtime.          . Multiple Vitamins-Minerals (HAIR/SKIN/NAILS) TABS   Oral   Take 1 tablet by mouth daily.         . polyvinyl alcohol (LIQUIFILM TEARS) 1.4 % ophthalmic solution   Both Eyes   Place 1 drop into both eyes daily as needed (for dry eyes).         . traMADol (ULTRAM) 50 MG tablet   Oral   Take 50 mg by mouth 2 (two) times daily as needed for pain.         . vitamin B-12 (CYANOCOBALAMIN) 1000 MCG tablet   Oral   Take 1,000 mcg by mouth daily.           BP 183/80  Pulse 58  Temp(Src) 98 F (36.7 C) (Oral)  Resp 18  SpO2 99%  Physical Exam  Nursing note and vitals reviewed. Constitutional: She is  oriented to person, place, and time. She appears well-developed and well-nourished. No distress.  HENT:  Head: Normocephalic and atraumatic.  Eyes: EOM are normal. Pupils are equal, round, and reactive to light.  Neck:    Cardiovascular: Regular rhythm, normal heart sounds  and intact distal pulses.  Bradycardia present.  Exam reveals no friction rub.   No murmur heard. Pulmonary/Chest: Effort normal and breath sounds normal. She has no wheezes. She has no rales.  Abdominal: Soft. Bowel sounds are normal. She exhibits no distension. There is no tenderness. There is no rebound and no guarding.  Musculoskeletal: Normal range of motion. She exhibits no tenderness.  No edema  Neurological: She is alert and oriented to person, place, and time. No cranial nerve deficit.  Skin: Skin is warm and dry. No rash noted.  Psychiatric: She has a normal mood and affect. Her behavior is normal.    ED Course  Procedures (including critical care time)  Labs Reviewed  CBC WITH DIFFERENTIAL  COMPREHENSIVE METABOLIC PANEL  URINALYSIS, ROUTINE W REFLEX MICROSCOPIC   Dg Chest 2 View  05/11/2013   *RADIOLOGY REPORT*  Clinical Data: Shortness of breath and bradycardia  CHEST - 2 VIEW  Comparison: April 27, 2013  Findings: There are now surgical clips in the neck region consistent with recent thyroid surgery.  Postoperative changes again noted in the cervical spine.  Lungs clear.  Heart size and pulmonary vascularity are normal.  No adenopathy.  No pneumothorax.  No bone lesions.  IMPRESSION:   Postoperative change in thyroid region.  Lungs clear.   Original Report Authenticated By: Bretta Bang, M.D.     1. Hypertension       MDM   Patient status post thyroidectomy 7 days ago he presented to her doctor's office today and was found to be hypertensive and bradycardic.  Patient states low heart rate is not unusual for her but her blood pressure is more elevated. She states since the surgery she has been off of her amlodipine and currently taking any medication for her high blood pressure. She spit out a high proportion medication for quite some time. She states since the surgery she has had generalized weakness and malaise but denies any acute symptoms such as fever, chest pain,  cough, lower extremity edema, muscle cramps. Prior to surgery patient had a sodium of 129 and a potassium of 3.0 and her doctor sent her here for repeat labs today. Here patient is awake and alert and in no acute distress. Blood pressure is elevated in pulses between 48 and 58. Her blood pressure remained stable at 180/80. Labs today are all within normal limits including sodium, potassium and calcium. No signs of parathyroid involvement.   Attempted to speak with patient's physician however they are out of the office currently. Recommended the patient restart her amlodipine at a low dose of 2.5 and follow up with her doctor later this week for repeat blood pressure check and adjustment of medication.        Gwyneth Sprout, MD 05/11/13 1404

## 2013-05-11 NOTE — ED Notes (Signed)
Pt assisted to call for ride home. 

## 2013-05-13 ENCOUNTER — Telehealth (INDEPENDENT_AMBULATORY_CARE_PROVIDER_SITE_OTHER): Payer: Self-pay | Admitting: General Surgery

## 2013-05-13 NOTE — Telephone Encounter (Signed)
I returned pt call. Pt states she is more concerned about her low pulse. I asked pt if she has taken pulse at home. Pt states it is running in the 50s. Pt advised low pulse and or BP can cause being tired and lack of energy.  Pt states she is taking her synthroid each morning on empty stomach. Pt states her calcium done in ER was normal. Pt states her PCP put her back on BP med.  Pt will monitor pulse and notify her PCP of pulse drops below 50. I advised her importance of keeping PCP aware if any symptoms. She states she understands.

## 2013-05-13 NOTE — Telephone Encounter (Signed)
Patient calling status post total thyroidectomy on 05/04/2013 by Dr Gerrit Friends. She had some issues with her blood pressure and a low pulse which she saw Dr Constance Goltz for and has a follow up with her next Tuesday. She stated she was seen at [facility] and had a heart evaluation which was okay. She is calling today because she is very lethargic. She is supposed to get her blood drawn tomorrow for her calcium level and was calling today to see if she should have any other thyroid labs drawn due to her lethargy. Please advise. Patient can be reached back at [phone].

## 2013-05-14 ENCOUNTER — Telehealth: Payer: Self-pay | Admitting: Hematology & Oncology

## 2013-05-14 NOTE — Telephone Encounter (Signed)
Pt called made 7-23 new pt appointment

## 2013-05-17 ENCOUNTER — Other Ambulatory Visit (INDEPENDENT_AMBULATORY_CARE_PROVIDER_SITE_OTHER): Payer: Self-pay | Admitting: Surgery

## 2013-05-18 ENCOUNTER — Encounter (INDEPENDENT_AMBULATORY_CARE_PROVIDER_SITE_OTHER): Payer: Self-pay | Admitting: Surgery

## 2013-05-18 ENCOUNTER — Ambulatory Visit (INDEPENDENT_AMBULATORY_CARE_PROVIDER_SITE_OTHER): Payer: Medicare Other | Admitting: Surgery

## 2013-05-18 VITALS — BP 124/68 | HR 54 | Temp 98.7°F | Resp 16 | Ht 64.0 in | Wt 172.6 lb

## 2013-05-18 DIAGNOSIS — E042 Nontoxic multinodular goiter: Secondary | ICD-10-CM

## 2013-05-18 DIAGNOSIS — E049 Nontoxic goiter, unspecified: Secondary | ICD-10-CM

## 2013-05-18 LAB — CALCIUM: Calcium: 9 mg/dL (ref 8.4–10.5)

## 2013-05-18 NOTE — Progress Notes (Signed)
General Surgery [facility]  Visit Diagnoses: 1. Multiple thyroid nodules   2. Goiter     HISTORY: Patient is a 67 year old female who underwent total thyroidectomy on 05/04/2013. 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 No malignancy is identified. Postoperative calcium levels have remained normal ranging from 9.0-9.9. Patient is taking Armour Thyroid 30 mg tablet daily under the direction of her endocrinologist.  EXAM: Surgical incision is healing nicely. Mild soft tissue swelling. Steri-Strips are removed in the office. No sign of infection. Voice quality is normal.  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  Patient is instructed to begin applying topical creams to her incision.  Patient will return in 6 weeks for final wound check.  Velora Heckler, MD, FACS General & Endocrine Surgery [facility]

## 2013-05-18 NOTE — Patient Instructions (Signed)
  COCOA BUTTER & VITAMIN E CREAM  ([employer] or other brand)  Apply cocoa butter/vitamin E cream to your incision 2 - 3 times daily.  Massage cream into incision for one minute with each application.  Use sunscreen (50 SPF or higher) for first 6 months after surgery if area is exposed to sun.  You may substitute Mederma or other scar reducing creams as desired.   

## 2013-05-19 ENCOUNTER — Ambulatory Visit (INDEPENDENT_AMBULATORY_CARE_PROVIDER_SITE_OTHER): Payer: MEDICARE | Admitting: Family Medicine

## 2013-05-19 ENCOUNTER — Encounter: Payer: Self-pay | Admitting: Family Medicine

## 2013-05-19 VITALS — BP 115/77 | HR 49 | Wt 171.0 lb

## 2013-05-19 DIAGNOSIS — I10 Essential (primary) hypertension: Secondary | ICD-10-CM

## 2013-05-19 MED ORDER — IRBESARTAN 150 MG PO TABS: 150.0000 mg | ORAL_TABLET | Freq: Every day | ORAL | Status: DC

## 2013-05-19 MED ORDER — LOSARTAN POTASSIUM 50 MG PO TABS: 50.0000 mg | ORAL_TABLET | Freq: Two times a day (BID) | ORAL | Status: DC

## 2013-05-19 NOTE — Progress Notes (Deleted)
  Subjective:    Patient ID: [name] [name], female    DOB: Feb 27, 1946, 67 y.o.   MRN: [id number]  HPI    Review of Systems     Objective:   Physical Exam        Assessment & Plan:

## 2013-05-19 NOTE — Progress Notes (Signed)
  Subjective:    Patient ID: [name] [name], female    DOB: [birth year], 67 y.o.   MRN: [id number]  HPI  Braniyah is here today for a recheck of her blood pressure.  She is a patient of Dr. Constance Goltz.  She has been having issues with elevated pressures as well as bradycardia.  She was seen at [facility] ED about a week ago.  She was instructed to follow up for blood pressure check in a week.   Review of Systems  Constitutional: Positive for fatigue.  Neurological: Positive for light-headedness.    Past Medical History  Diagnosis Date  . Chronic back pain   . Chronic neck pain   . Abnormal Pap smear   . Post-menopausal   . Sexual dysfunction   . Urinary incontinence   . Anemia   . Breast cancer   . Cataracts, bilateral   . Depression   . High cholesterol   . Goiter   . Complication of anesthesia     slow to wake up  . Hypertension   . Shortness of breath     with exertion  . Numbness     hands / feet /arms / legs - followed by Dr. Sol Passer - [place]  . Arthritis     back and neck  . Bruises easily   . Frequency of urination   . Abnormal peripheral vision     RT EYE    Family History  Problem Relation Age of Onset  . Cervical cancer Daughter   . Heart disease Sister   . Heart disease Mother   . Heart disease Brother   . Colon cancer Father   . Stroke Sister   . Cancer Brother     esophageal  . Asthma Maternal Grandmother   . Diabetes Mother   . Diabetes Brother   . Hypertension Mother   . Hypertension Sister   . Hypertension Brother   . Thyroid disease Sister   . Thyroid disease Daughter     History   Social History  . Marital Status: Single    Spouse Name: N/A    Number of Children: N/A  . Years of Education: N/A   Occupational History  . retired    Social History Main Topics  . Smoking status: Former Smoker -- 0.50 packs/day for 40 years    Types: Cigarettes    Quit date: 04/28/1967  . Smokeless tobacco: Never Used  . Alcohol Use: Yes      Comment: 1-2 per month  . Drug Use: No  . Sexually Active: Not Currently   Other Topics Concern  . Not on file   Social History Narrative  . No narrative on file        Objective:   Physical Exam  Constitutional: She appears well-nourished. No distress.  HENT:  Head: Normocephalic.  Eyes: No scleral icterus.  Neck: No thyromegaly present.  Cardiovascular: Regular rhythm and normal heart sounds.   Bradycardic   Pulmonary/Chest: Effort normal and breath sounds normal.  Abdominal: There is no tenderness.  Musculoskeletal: She exhibits no edema and no tenderness.  Neurological: She is alert.  Skin: Skin is warm and dry.  Psychiatric: She has a normal mood and affect. Her behavior is normal. Judgment and thought content normal.          Assessment & Plan:

## 2013-05-19 NOTE — Patient Instructions (Addendum)
1)  BP/Pulse - Your BP is perfect on the 2.5 mg of amlodipine but your pulse remains low.  Let try you on an ARB to see if this will keep your BP good but not lower your pulse.  Take the Avapro 150 mg in the am if your insurance will cover it.  If not then try the losartan 50 mg and take it 1-2 times per day.      DASH Diet The DASH diet stands for "Dietary Approaches to Stop Hypertension." It is a healthy eating plan that has been shown to reduce high blood pressure (hypertension) in as little as 14 days, while also possibly providing other significant health benefits. These other health benefits include reducing the risk of breast cancer after menopause and reducing the risk of type 2 diabetes, heart disease, colon cancer, and stroke. Health benefits also include weight loss and slowing kidney failure in patients with chronic kidney disease.  DIET GUIDELINES  Limit salt (sodium). Your diet should contain less than 1500 mg of sodium daily.  Limit refined or processed carbohydrates. Your diet should include mostly whole grains. Desserts and added sugars should be used sparingly.  Include small amounts of heart-healthy fats. These types of fats include nuts, oils, and tub margarine. Limit saturated and trans fats. These fats have been shown to be harmful in the body. CHOOSING FOODS  The following food groups are based on a 2000 calorie diet. See your Registered Dietitian for individual calorie needs. Grains and Grain Products (6 to 8 servings daily)  Eat More Often: Whole-wheat bread, brown rice, whole-grain or wheat pasta, quinoa, popcorn without added fat or salt (air popped).  Eat Less Often: White bread, white pasta, white rice, cornbread. Vegetables (4 to 5 servings daily)  Eat More Often: Fresh, frozen, and canned vegetables. Vegetables may be raw, steamed, roasted, or grilled with a minimal amount of fat.  Eat Less Often/Avoid: Creamed or fried vegetables. Vegetables in a cheese  sauce. Fruit (4 to 5 servings daily)  Eat More Often: All fresh, canned (in natural juice), or frozen fruits. Dried fruits without added sugar. One hundred percent fruit juice ( cup [237 mL] daily).  Eat Less Often: Dried fruits with added sugar. Canned fruit in light or heavy syrup. [employer], Fish, and Poultry (2 servings or less daily. One serving is 3 to 4 oz [85-114 g]).  Eat More Often: Ninety percent or leaner ground beef, tenderloin, sirloin. Round cuts of beef, chicken breast, [place] breast. All fish. Grill, bake, or broil your meat. Nothing should be fried.  Eat Less Often/Avoid: Fatty cuts of meat, [place], or chicken leg, thigh, or wing. Fried cuts of meat or fish. Dairy (2 to 3 servings)  Eat More Often: Low-fat or fat-free milk, low-fat plain or light yogurt, reduced-fat or part-skim cheese.  Eat Less Often/Avoid: Milk (whole, 2%).Whole milk yogurt. Full-fat cheeses. Nuts, Seeds, and Legumes (4 to 5 servings per week)  Eat More Often: All without added salt.  Eat Less Often/Avoid: Salted nuts and seeds, canned beans with added salt. Fats and Sweets (limited)  Eat More Often: Vegetable oils, tub margarines without trans fats, sugar-free gelatin. Mayonnaise and salad dressings.  Eat Less Often/Avoid: Coconut oils, palm oils, butter, stick margarine, cream, half and half, cookies, candy, pie. FOR MORE INFORMATION The Dash Diet Eating Plan: www.dashdiet.org Document Released: 10/31/2011 Document Revised: 02/03/2012 Document Reviewed: 10/31/2011 [facility] Patient Information 2014 [place], [place].

## 2013-06-15 ENCOUNTER — Telehealth: Payer: Self-pay | Admitting: *Deleted

## 2013-06-15 NOTE — Telephone Encounter (Signed)
[name] [name]  Please send this as a refill request  Not sure what needs to be refilled

## 2013-06-15 NOTE — Telephone Encounter (Signed)
refill 

## 2013-06-15 NOTE — Telephone Encounter (Signed)
Pt called for a refill request. Pt request 90 day supply. Was seen by Dr Alberteen Sam while Dr Constance Goltz was out of town

## 2013-06-16 ENCOUNTER — Ambulatory Visit: Payer: MEDICARE

## 2013-06-16 ENCOUNTER — Other Ambulatory Visit: Payer: MEDICARE | Admitting: Lab

## 2013-06-16 ENCOUNTER — Other Ambulatory Visit: Payer: Self-pay | Admitting: Internal Medicine

## 2013-06-16 ENCOUNTER — Ambulatory Visit (HOSPITAL_BASED_OUTPATIENT_CLINIC_OR_DEPARTMENT_OTHER): Payer: MEDICARE | Admitting: Hematology & Oncology

## 2013-06-16 VITALS — BP 147/66 | HR 48 | Temp 98.2°F | Resp 16 | Ht 64.0 in | Wt 173.0 lb

## 2013-06-16 DIAGNOSIS — C50911 Malignant neoplasm of unspecified site of right female breast: Secondary | ICD-10-CM

## 2013-06-16 DIAGNOSIS — M810 Age-related osteoporosis without current pathological fracture: Secondary | ICD-10-CM

## 2013-06-16 DIAGNOSIS — C50919 Malignant neoplasm of unspecified site of unspecified female breast: Secondary | ICD-10-CM

## 2013-06-16 MED ORDER — IRBESARTAN 150 MG PO TABS: 150.0000 mg | ORAL_TABLET | Freq: Every day | ORAL | Status: DC

## 2013-06-16 NOTE — Progress Notes (Signed)
This office note has been dictated.

## 2013-06-18 NOTE — Progress Notes (Signed)
CC:   Kendrick Ranch, M.D.  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  HISTORY OF PRESENT ILLNESS:  [name] [name] is a very nice 67 year old white female.  She is originally from [place], [place].  She is followed by Dr. Constance Goltz.  She and her husband moved down here about 11 months ago.  There is family down this way.  She apparently had a lumpectomy back in 1997.  This was done up in [place].  She says that she did not need any treatment afterwards outside of radiation.  She says that the lymph nodes were negative.  There is no pathological information that we can get on this.  She had been seeing an oncologist but has not seen one for about 7 or 8 years.  She recently had issues with her thyroid.  She was found have an increase in thyroid nodules on the right.  She did undergo a thyroidectomy.  The pathology report ([id number]) showed nodular hyperplasia.  No malignancy was noted.  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  Again, this was done on June 10th.  She is on Armour Thyroid.  She feels fairly well.  She really has no specific complaints.  Her last mammogram was done back in January.  Everything looked okay on the mammogram.  She had lab work done just a month ago.  The CBC and electrolytes all looked okay.  Dr. Constance Goltz felt that an oncologic evaluation was indicated just to follow up regarding her breast cancer.  She did have neck surgery.  She has issues with chronic pain following the surgery.  She is fairly active.  She is wanting to lose a little weight.  She has had no change in bowel or bladder habits.  I think she might be due for a colonoscopy soon.  She has had no bleeding.  There has been no bruising.  She had no fevers, sweats, or chills.  There has been no problem with infections.  PAST MEDICAL HISTORY: 1. 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 2. Multiple thyroid nodules, status  post thyroidectomy. 3. Hyperlipidemia. 4. Neck and back pain. 5. Cervical laminectomy in 2010. 6. Bilateral cataract extraction. 7. Urinary incontinence. 8. History of depression. 9.  ALLERGIES:  None.  MEDICATIONS:  Armour Thyroid 30 mg p.o. daily, vitamin B12 one thousand micrograms p.o. daily, gabapentin 300 mg p.o. t.i.d., Advil 600 mg p.o. q.8 hours p.r.n., Avapro 150 mg p.o. daily, Xalatan eyedrops 0.05% one drop both eyes q.h.s., Ultram 50 mg p.o. b.i.d. p.r.n., Cosopt 22.3/6.8 one drop to the right eye b.i.d.  SOCIAL HISTORY:  Remarkable for past tobacco use.  She has about a 20 pack-year history of tobacco use.  She has rare alcohol use.  There are no obvious occupational exposures.  FAMILY HISTORY:  Remarkable for colon cancer in her father.  There is also a history of cervical cancer in a daughter.  There a history of heart disease.  REVIEW OF SYSTEMS:  As stated in history of present illness.  No additional findings are noted on 12-system review.  PHYSICAL EXAMINATION:  General:  This is a well-developed, well- nourished white female in no obvious distress.  Vital signs: Temperature of 98.2, pulse 48, respiratory rate 16, blood pressure 147/66.  Weight is 173.  Head and neck:  Normocephalic, atraumatic skull.  There are no ocular or oral lesions.  She has no palpable cervical or supraclavicular lymph nodes.  Lungs:  Clear bilaterally. Cardiac:  Regular rate  and rhythm with a normal S1 and S2.  There are no murmurs, rubs or bruits.  Breasts:  Show the left breast with no masses, edema or erythema.  There is no left axillary adenopathy.  Right breast shows a well-healed lumpectomy at the 10 o'clock position.  There may be some slight contraction of the right breast from radiation.  There may be some slight hyperpigmentation.  No distinct mass noted in the right breast.  There is no right axillary adenopathy.  Abdomen:  Soft.  She has good bowel sounds.  There is no  fluid wave.  There is no palpable hepatosplenomegaly.  Back:  No tenderness over the spine, ribs, or hips. Extremities:  Show no clubbing, cyanosis or edema.  No lymphadenopathy is noted in the right arm.  Neurological:  Shows no focal neurological deficits.  LABORATORY STUDIES:  Not done this visit.  IMPRESSION:  Ms. Treloar is a 67 year old white female with a remote history of early-stage breast cancer.  It sounds like this was stage I disease.  She did not require any adjuvant chemotherapy or hormonal therapy.  We will plan to follow her along in 6 months' time.  I do not see any specific tests that we need to do regarding her history of breast cancer.  She sees Dr. Constance Goltz.  Dr. Constance Goltz has always been very thorough with managing her patients and their her routine health maintenance studies.  I spent a good hour with Ms. Welliver.  She is very nice.  She will be headed to [facility] soon.  I think that her daughter lives down in [place].    ______________________________ Josph Macho, M.D. PRE/MEDQ  D:  06/17/2013  T:  06/18/2013  Job:  [id number]

## 2013-06-22 ENCOUNTER — Encounter (INDEPENDENT_AMBULATORY_CARE_PROVIDER_SITE_OTHER): Payer: Self-pay | Admitting: Surgery

## 2013-06-22 ENCOUNTER — Ambulatory Visit (INDEPENDENT_AMBULATORY_CARE_PROVIDER_SITE_OTHER): Payer: Medicare Other | Admitting: Surgery

## 2013-06-22 VITALS — BP 150/80 | HR 48 | Temp 97.6°F | Resp 18 | Ht 64.0 in | Wt 174.0 lb

## 2013-06-22 DIAGNOSIS — E049 Nontoxic goiter, unspecified: Secondary | ICD-10-CM

## 2013-06-22 DIAGNOSIS — E042 Nontoxic multinodular goiter: Secondary | ICD-10-CM

## 2013-06-22 NOTE — Progress Notes (Signed)
General Surgery [facility]  Visit Diagnoses: 1. Multiple thyroid nodules   2. Goiter     HISTORY: Patient is a 67 year old female who underwent total thyroidectomy. She is currently taking Armour Thyroid which is being adjusted by her endocrinologist. She returns today for a final wound check.  EXAM: Surgical incision is nicely healed with good cosmetic result. Minimal soft tissue swelling. No tenderness. Voice quality is normal.  IMPRESSION: Status post total thyroidectomy with benign final pathology  PLAN: Patient will continue to apply topical creams to her incision. She will continue evaluation by her endocrinologist with management of her thyroid hormone replacement.  Patient will return for surgical care as needed.  Velora Heckler, MD, FACS General & Endocrine Surgery [facility]

## 2013-06-22 NOTE — Patient Instructions (Signed)
  COCOA BUTTER & VITAMIN E CREAM  ([employer] or other brand)  Apply cocoa butter/vitamin E cream to your incision 2 - 3 times daily.  Massage cream into incision for one minute with each application.  Use sunscreen (50 SPF or higher) for first 6 months after surgery if area is exposed to sun.  You may substitute Mederma or other scar reducing creams as desired.   

## 2013-06-30 DIAGNOSIS — I10 Essential (primary) hypertension: Secondary | ICD-10-CM | POA: Insufficient documentation

## 2013-06-30 NOTE — Assessment & Plan Note (Addendum)
[name] [name]'s BP looks good but her pulse remains low.  We discussed switching her to an ARB to see if this will keep her BP under good control but not lower her pulse.  She will take Avapro if her insurance covers it.  If not then she will try the losartan.

## 2013-07-05 ENCOUNTER — Telehealth: Payer: Self-pay | Admitting: Internal Medicine

## 2013-07-05 NOTE — Telephone Encounter (Signed)
[name] [name] call pt and set up 30 min OV with me as I want to check her BP

## 2013-07-06 NOTE — Telephone Encounter (Signed)
Called [name] [name] she is in [place] and will not be returning until week after Labor Day. Made her appt for 9/9 at 11:00

## 2013-08-03 ENCOUNTER — Encounter: Payer: Self-pay | Admitting: Internal Medicine

## 2013-08-03 ENCOUNTER — Ambulatory Visit (INDEPENDENT_AMBULATORY_CARE_PROVIDER_SITE_OTHER): Payer: MEDICARE | Admitting: Internal Medicine

## 2013-08-03 ENCOUNTER — Ambulatory Visit: Payer: MEDICARE | Admitting: Internal Medicine

## 2013-08-03 VITALS — BP 133/74 | HR 87 | Temp 97.4°F | Resp 20 | Wt 172.0 lb

## 2013-08-03 DIAGNOSIS — Z9889 Other specified postprocedural states: Secondary | ICD-10-CM

## 2013-08-03 DIAGNOSIS — E039 Hypothyroidism, unspecified: Secondary | ICD-10-CM

## 2013-08-03 DIAGNOSIS — I10 Essential (primary) hypertension: Secondary | ICD-10-CM

## 2013-08-03 DIAGNOSIS — E890 Postprocedural hypothyroidism: Secondary | ICD-10-CM

## 2013-08-03 MED ORDER — IRBESARTAN 150 MG PO TABS: 150.0000 mg | ORAL_TABLET | Freq: Every day | ORAL | Status: DC

## 2013-08-03 NOTE — Progress Notes (Signed)
Subjective:    Patient ID: [name] [name], female    DOB: September 26, 1946, 67 y.o.   MRN: [id number]  HPI [name] [name] is here for follow up of her BP  She is now on Avapro and tolerating well.    She has occasional fatigue  She is following with Dr. Talmage Nap and has an order for a TSH today.  No Known Allergies Past Medical History  Diagnosis Date  . Chronic back pain   . Chronic neck pain   . Abnormal Pap smear   . Post-menopausal   . Sexual dysfunction   . Urinary incontinence   . Anemia   . Breast cancer   . Cataracts, bilateral   . Depression   . High cholesterol   . Goiter   . Complication of anesthesia     slow to wake up  . Hypertension   . Shortness of breath     with exertion  . Numbness     hands / feet /arms / legs - [place]  . Arthritis     back and neck  . Bruises easily   . Frequency of urination   . Abnormal peripheral vision     RT EYE   Past Surgical History  Procedure Laterality Date  . Lumpectomy  1997    Rt  . Cataract extraction, bilateral  [id number]  . Laparoscopy  1978    BTL  . Neck fusion  2010  . Biopsy thyroid    . Breast surgery    . Eye surgery  08/2012    detached retina surg  . Thyroidectomy N/A 05/04/2013    Procedure:  TOTAL THYROIDECTOMY;  Surgeon: Velora Heckler, MD;  Location: [facility];  Service: General;  Laterality: N/A;   History   Social History  . Marital Status: Single    Spouse Name: N/A    Number of Children: N/A  . Years of Education: N/A   Occupational History  . retired    Social History Main Topics  . Smoking status: Former Smoker -- 0.50 packs/day for 40 years    Types: Cigarettes    Quit date: 04/28/1967  . Smokeless tobacco: Never Used  . Alcohol Use: Yes     Comment: 1-2 per month  . Drug Use: No  . Sexual Activity: Not Currently   Other Topics Concern  . Not on file   Social History Narrative  . No narrative on file   Family History  Problem Relation Age of Onset  .  Cervical cancer Daughter   . Heart disease Sister   . Heart disease Mother   . Heart disease Brother   . Colon cancer Father   . Stroke Sister   . Cancer Brother     esophageal  . Asthma Maternal Grandmother   . Diabetes Mother   . Diabetes Brother   . Hypertension Mother   . Hypertension Sister   . Hypertension Brother   . Thyroid disease Sister   . Thyroid disease Daughter    Patient Active Problem List   Diagnosis Date Noted  . S/P total thyroidectomy 08/03/2013  . Essential hypertension, benign 06/30/2013  . Other forms of retinal detachment 01/18/2013  . Urinary incontinence, mixed 01/18/2013  . Unspecified constipation 01/18/2013  . HTN (hypertension) 12/28/2012  . Family history of colon cancer 05/30/2012  . DJD (degenerative joint disease), cervical 05/30/2012  . Multiple thyroid nodules 04/06/2012  . Glaucoma 03/19/2012  . Former smoker 03/19/2012  .  Chronic pain following surgery or procedure 03/19/2012  . Breast calcifications on mammogram 03/19/2012  . Anemia   . Breast cancer   . Cataracts, bilateral   . Depression   . High cholesterol   . Goiter    Current Outpatient Prescriptions on File Prior to Visit  Medication Sig Dispense Refill  . ARMOUR THYROID 30 MG tablet Take 120 mg by mouth daily before breakfast.       . aspirin 81 MG tablet Take 325 mg by mouth daily.       . Cyanocobalamin (VITAMIN B 12 PO) Take by mouth every morning.      . dorzolamide-timolol (COSOPT) 22.3-6.8 MG/ML ophthalmic solution Place 1 drop into both eyes 2 (two) times daily.       . fish oil-omega-3 fatty acids 1000 MG capsule Take 2 g by mouth daily.      Marland Kitchen gabapentin (NEURONTIN) 300 MG capsule Take 300 mg by mouth 3 (three) times daily.      . hydrocortisone cream 1 % Apply 1 application topically daily as needed (for itching skin).      Marland Kitchen ibuprofen (ADVIL,MOTRIN) 600 MG tablet Take 1 tablet (600 mg total) by mouth every 8 (eight) hours as needed for pain.  30 tablet  0  .  irbesartan (AVAPRO) 150 MG tablet Take 1 tablet (150 mg total) by mouth daily.  90 tablet  1  . Multiple Vitamins-Minerals (HAIR/SKIN/NAILS) TABS Take 1 tablet by mouth daily.      . polyvinyl alcohol (LIQUIFILM TEARS) 1.4 % ophthalmic solution Place 1 drop into both eyes daily as needed (for dry eyes).      Marland Kitchen acetaminophen (TYLENOL) 500 MG tablet Take 500 mg by mouth every 6 (six) hours as needed for pain.      Marland Kitchen latanoprost (XALATAN) 0.005 % ophthalmic solution Place 1 drop into both eyes at bedtime.       . traMADol (ULTRAM) 50 MG tablet Take 50 mg by mouth 2 (two) times daily as needed for pain.       No current facility-administered medications on file prior to visit.      Review of Systems See HPI    Objective:   Physical Exam  Physical Exam  Nursing note and vitals reviewed.  Constitutional: She is oriented to person, place, and time. She appears well-developed and well-nourished.  HENT:  Head: Normocephalic and atraumatic.  Cardiovascular: Normal rate and regular rhythm. Exam reveals no gallop and no friction rub.  No murmur heard.  Pulmonary/Chest: Breath sounds normal. She has no wheezes. She has no rales.  Neurological: She is alert and oriented to person, place, and time.  Skin: Skin is warm and dry.  Ext:  No edema Psychiatric: She has a normal mood and affect. Her behavior is normal.        Assessment & Plan:  HTN  Continue avapro  Will check chemistries today  Hypothyroidism   Will get TSH today  Pt was referred for colonoscopy but did not  keeep appt due to her eye surgery  Will give number to [place] so she can reschedule her colonoscopy

## 2013-08-04 ENCOUNTER — Encounter: Payer: Self-pay | Admitting: *Deleted

## 2013-08-04 LAB — COMPREHENSIVE METABOLIC PANEL WITH GFR
ALT: 18 U/L (ref 0–35)
AST: 16 U/L (ref 0–37)
Albumin: 4.2 g/dL (ref 3.5–5.2)
Alkaline Phosphatase: 74 U/L (ref 39–117)
BUN: 16 mg/dL (ref 6–23)
CO2: 29 meq/L (ref 19–32)
Calcium: 9.5 mg/dL (ref 8.4–10.5)
Chloride: 97 meq/L (ref 96–112)
Creat: 0.59 mg/dL (ref 0.50–1.10)
Glucose, Bld: 88 mg/dL (ref 70–99)
Potassium: 3.8 meq/L (ref 3.5–5.3)
Sodium: 134 meq/L — ABNORMAL LOW (ref 135–145)
Total Bilirubin: 0.4 mg/dL (ref 0.3–1.2)
Total Protein: 6.8 g/dL (ref 6.0–8.3)

## 2013-08-05 ENCOUNTER — Encounter: Payer: Self-pay | Admitting: *Deleted

## 2013-08-19 ENCOUNTER — Ambulatory Visit (HOSPITAL_BASED_OUTPATIENT_CLINIC_OR_DEPARTMENT_OTHER): Payer: MEDICARE | Admitting: Genetic Counselor

## 2013-08-19 ENCOUNTER — Encounter: Payer: Self-pay | Admitting: Genetic Counselor

## 2013-08-19 ENCOUNTER — Other Ambulatory Visit: Payer: MEDICARE | Admitting: Lab

## 2013-08-19 DIAGNOSIS — Z803 Family history of malignant neoplasm of breast: Secondary | ICD-10-CM

## 2013-08-19 DIAGNOSIS — E049 Nontoxic goiter, unspecified: Secondary | ICD-10-CM

## 2013-08-19 DIAGNOSIS — Z800 Family history of malignant neoplasm of digestive organs: Secondary | ICD-10-CM

## 2013-08-19 DIAGNOSIS — C50919 Malignant neoplasm of unspecified site of unspecified female breast: Secondary | ICD-10-CM

## 2013-08-19 DIAGNOSIS — C50911 Malignant neoplasm of unspecified site of right female breast: Secondary | ICD-10-CM

## 2013-08-19 NOTE — Progress Notes (Signed)
Dr.  Arlan Organ requested a consultation for genetic counseling and risk assessment for [name] [name], a 67 y.o. female, for discussion of her personal history of breast cancer and goiter and family history of breast, colon and esophageal cacner.  She presents to clinic today to discuss the possibility of a genetic predisposition to cancer, and to further clarify her risks, as well as her family members' risks for cancer.   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 This was treated with lumpectomy, radiation and tamoxifen for 5 years.  She reports that the tumor was ER+.    Past Medical History  Diagnosis Date  . Chronic back pain   . Chronic neck pain   . Abnormal Pap smear   . Post-menopausal   . Sexual dysfunction   . Urinary incontinence   . Anemia   . Breast cancer   . Cataracts, bilateral   . Depression   . High cholesterol   . Goiter   . Complication of anesthesia     slow to wake up  . Hypertension   . Shortness of breath     with exertion  . Numbness     hands / feet /arms / legs - [place] Dr. Sol Passer [place]  . Arthritis     back and neck  . Bruises easily   . Frequency of urination   . Abnormal peripheral vision     RT EYE    Past Surgical History  Procedure Laterality Date  . Lumpectomy  1997    Rt  . Cataract extraction, bilateral  [id number]  . Laparoscopy  1978    BTL  . Neck fusion  2010  . Biopsy thyroid    . Breast surgery    . Eye surgery  08/2012    detached retina surg  . Thyroidectomy N/A 05/04/2013    Procedure:  TOTAL THYROIDECTOMY;  Surgeon: Velora Heckler, MD;  Location: [facility];  Service: General;  Laterality: N/A;    History   Social History  . Marital Status: Single    Spouse Name: N/A    Number of Children: 3  . Years of Education: N/A   Occupational History  . retired    Social History Main Topics  . Smoking status: Former Smoker -- 0.50 packs/day  for 40 years    Types: Cigarettes    Quit date: 04/28/1967  . Smokeless tobacco: Never Used  . Alcohol Use: Yes     Comment: 1-2 per month  . Drug Use: No  . Sexual Activity: Not Currently   Other Topics Concern  . None   Social History Narrative  . None    REPRODUCTIVE HISTORY AND PERSONAL RISK ASSESSMENT FACTORS: Menarche was at age 25.   postmenopausal Uterus Intact: yes Ovaries Intact: yes [id number], first live birth at age 31  She has not previously undergone treatment for infertility.   Oral Contraceptive use: 3 years   She has used HRT in the past.    FAMILY HISTORY:  We obtained a detailed, 4-generation family history.  Significant diagnoses are listed below: Family History  Problem Relation Age of Onset  . Cervical cancer Daughter     dx in her 30s  . Heart disease Sister   . Heart disease Mother   . Diabetes Mother   . Hypertension Mother   . Colon cancer Father 45  . Stroke Sister   .  Esophageal cancer Brother 62    smoker  . Diabetes Brother   . Heart disease Brother   . Hypertension Brother   . Asthma Maternal Grandmother   . Hypertension Sister   . Thyroid disease Sister   . Colon cancer Paternal Aunt 58  . Breast cancer Paternal Grandmother     dx in her 3s  . Thyroid disease Other 15    niece    Patient's maternal ancestors are of [place], [place] and [place] descent, and paternal ancestors are of [place] descent. There is no reported Ashkenazi Jewish ancestry. There is no known consanguinity.  GENETIC COUNSELING ASSESSMENT: Liberta Gimpel is a 67 y.o. female with a personal history of DCIS breast cancer and goiter and family history of breast and colon cancer which somewhat suggestive of a hereditary cancer syndrome and predisposition to cancer. We, therefore, discussed and recommended the following at today's visit.   DISCUSSION: We reviewed the characteristics, features and inheritance patterns of hereditary cancer syndromes. We also  discussed genetic testing, including the appropriate family members to test, the process of testing, insurance coverage and turn-around-time for results. We discussed the risks for BRCA mutations, as well as PTEN mutations because of her personal risk factors of breast cancer and goiter.  PLAN: After considering the risks, benefits, and limitations, Odilia Damico provided informed consent to pursue genetic testing and the blood sample will be sent to [facility] for analysis of the Breast/Ovarian Cancer syndrome. We discussed the implications of a positive, negative and/ or variant of uncertain significance genetic test result. Results should be available within approximately 3-4 weeks' time, at which point they will be disclosed by telephone to [facility], as will any additional recommendations warranted by these results. Shaniyah Wix will receive a summary of her genetic counseling visit and a copy of her results once available. This information will also be available in Epic. We encouraged Warrene Kapfer to remain in contact with cancer genetics annually so that we can continuously update the family history and inform her of any changes in cancer genetics and testing that may be of benefit for her family. Emmajo Dolder's questions were answered to her satisfaction today. Our contact information was provided should additional questions or concerns arise.  The patient was seen for a total of 75 minutes, greater than 50% of which was spent face-to-face counseling.  This plan is being carried out per Dr. Feliz Beam recommendations.  This note will also be sent to the referring provider via the electronic medical record. The patient will be supplied with a summary of this genetic counseling discussion as well as educational information on the discussed hereditary cancer syndromes following the conclusion of their visit.   Patient was discussed with Dr. Drue Second.   _______________________________________________________________________ For Office Staff:  Number of people involved in session: 1 Was an Intern/ student involved with case: yes

## 2013-08-20 ENCOUNTER — Telehealth: Payer: Self-pay | Admitting: Genetic Counselor

## 2013-08-20 NOTE — Telephone Encounter (Signed)
[facility] called saying that she did not meet medical criteria for testing.  I called the patient to let her know and she told me that her daughter was tested about 5 years ago and that she "has the gene".  She will call her daughter to see if we can get a copy of the test result.

## 2013-08-26 ENCOUNTER — Telehealth: Payer: Self-pay | Admitting: Genetic Counselor

## 2013-08-26 NOTE — Telephone Encounter (Signed)
Her daughter has been ill and is unable to track this down at this time.  Once she has recovered and has less going on, her daughter will contact the site who did her testing and ask that they fax me a copy of her test result.

## 2013-09-30 ENCOUNTER — Other Ambulatory Visit: Payer: Self-pay | Admitting: Internal Medicine

## 2013-09-30 DIAGNOSIS — Z1211 Encounter for screening for malignant neoplasm of colon: Secondary | ICD-10-CM

## 2013-10-04 DIAGNOSIS — H04123 Dry eye syndrome of bilateral lacrimal glands: Secondary | ICD-10-CM | POA: Insufficient documentation

## 2013-10-27 ENCOUNTER — Encounter: Payer: Self-pay | Admitting: Neurology

## 2013-10-28 ENCOUNTER — Ambulatory Visit (INDEPENDENT_AMBULATORY_CARE_PROVIDER_SITE_OTHER): Payer: MEDICARE | Admitting: Neurology

## 2013-10-28 ENCOUNTER — Encounter (INDEPENDENT_AMBULATORY_CARE_PROVIDER_SITE_OTHER): Payer: Self-pay

## 2013-10-28 ENCOUNTER — Encounter: Payer: Self-pay | Admitting: Neurology

## 2013-10-28 VITALS — BP 130/72 | HR 60 | Ht 64.5 in | Wt 173.0 lb

## 2013-10-28 DIAGNOSIS — R531 Weakness: Secondary | ICD-10-CM | POA: Insufficient documentation

## 2013-10-28 DIAGNOSIS — G8929 Other chronic pain: Secondary | ICD-10-CM

## 2013-10-28 DIAGNOSIS — G243 Spasmodic torticollis: Secondary | ICD-10-CM

## 2013-10-28 DIAGNOSIS — M6281 Muscle weakness (generalized): Secondary | ICD-10-CM

## 2013-10-28 NOTE — Progress Notes (Signed)
[facility]    Provider:  Dr Hosie Poisson Referring Provider: Kendrick Ranch, * Primary Care Physician:  Levon Hedger, MD  CC:  Chronic cervical neck pain  HPI:  [name] [name] is a 67 y.o. female here as a referral from Dr. Constance Goltz for evaluation of chronic neck and back pain  Has had chronic history of neck pain since late 1990s.Had neck surgery in 2010, per records was C5-6 fusion. Surgery was done by Dr Pernell Dupre in [place]. Notes chronic aching pain, involves cervical region and radiates down her arms.  Gets generalized muscle weakness. Has chronic sensory loss in her fingers. Previously had seen Dr Sol Passer with [facility], had been treating her with gabapentin. Has sensation of head pulling to the right, rotating to the left. Gets chronic muscle spasms/cramping spasms in her bilateral trapezius and right splenius capitis. This pain is causing difficulty in her life, she is unable to exercise or hold onto objects due to pain. Has tried other pain medications in the past and they're too sedating.  Has history of chronic lumbar back pain, non-radiating. Helps with water aerobics.   MRI from 2013 of cervical spine shows prior fusion with anterior fixation the C5-6 level, some increased signal in T2 and STIR and anterior spinal cord at that level consider myelopathic change  Review of Systems: Out of a complete 14 system review, the patient complains of only the following symptoms, and all other reviewed systems are negative. Positive blurred vision double vision eye pain easy bruising feeling hot feeling cold shortness of breath headache numbness weakness dizziness tongue and none of sleep decreased energy skin sensitivity rash itching  History   Social History  . Marital Status: Single    Spouse Name: N/A    Number of Children: 3  . Years of Education: 14   Occupational History  . retired    Social History Main Topics  . Smoking status: Former  Smoker -- 0.50 packs/day for 40 years    Types: Cigarettes    Quit date: 04/28/1967  . Smokeless tobacco: Never Used  . Alcohol Use: Yes     Comment: 1-2 per month  . Drug Use: No  . Sexual Activity: Not Currently   Other Topics Concern  . Not on file   Social History Narrative   Patient is single and lives alone.   Patient has 3 children.   Patient is retired.   Patient has an Scientist, research (physical sciences) in [employer]   Patient is right handed.   Patient drinks two cups of coffee daily and rarely sodas.    Family History  Problem Relation Age of Onset  . Cervical cancer Daughter     dx in her 30s  . Heart disease Sister   . Heart disease Mother   . Diabetes Mother   . Hypertension Mother   . Colon cancer Father 70  . Stroke Sister   . Esophageal cancer Brother 67    smoker  . Diabetes Brother   . Heart disease Brother   . Hypertension Brother   . Asthma Maternal Grandmother   . Hypertension Sister   . Thyroid disease Sister   . Colon cancer Paternal Aunt 88  . Breast cancer Paternal Grandmother     dx in her 22s  . Thyroid disease Other 22    niece    Past Medical History  Diagnosis Date  . Chronic back pain   . Chronic neck pain   . Abnormal Pap smear   .  Post-menopausal   . Sexual dysfunction   . Urinary incontinence   . Anemia   . Breast cancer   . Cataracts, bilateral   . Depression   . High cholesterol   . Goiter   . Complication of anesthesia     slow to wake up  . Hypertension   . Shortness of breath     with exertion  . Numbness     hands / feet /arms / legs - [place] by Dr. Sol Passer [place]  . Arthritis     back and neck  . Bruises easily   . Frequency of urination   . Abnormal peripheral vision     RT EYE    Past Surgical History  Procedure Laterality Date  . Lumpectomy  1997    Rt  . Cataract extraction, bilateral  [id number]  . Laparoscopy  1978    BTL  . Neck fusion  2010  . Biopsy thyroid    . Breast surgery    . Eye  surgery  08/2012    detached retina surg  . Thyroidectomy N/A 05/04/2013    Procedure:  TOTAL THYROIDECTOMY;  Surgeon: Velora Heckler, MD;  Location: [facility];  Service: General;  Laterality: N/A;    Current Outpatient Prescriptions  Medication Sig Dispense Refill  . acetaminophen (TYLENOL) 500 MG tablet Take 500 mg by mouth every 6 (six) hours as needed for pain.      Mack Guise THYROID 30 MG tablet Take 90 mg by mouth daily before breakfast.       . aspirin 81 MG tablet Take 325 mg by mouth daily.       . Cyanocobalamin (VITAMIN B 12 PO) Take by mouth every morning.      . dorzolamide-timolol (COSOPT) 22.3-6.8 MG/ML ophthalmic solution Place 1 drop into both eyes 2 (two) times daily.       . fish oil-omega-3 fatty acids 1000 MG capsule Take 2 g by mouth daily.      Marland Kitchen gabapentin (NEURONTIN) 300 MG capsule Take 300 mg by mouth 3 (three) times daily.      . hydrocortisone cream 1 % Apply 1 application topically daily as needed (for itching skin).      Marland Kitchen ibuprofen (ADVIL,MOTRIN) 600 MG tablet Take 1 tablet (600 mg total) by mouth every 8 (eight) hours as needed for pain.  30 tablet  0  . irbesartan (AVAPRO) 150 MG tablet Take 1 tablet (150 mg total) by mouth daily.  90 tablet  1  . Multiple Vitamins-Minerals (HAIR/SKIN/NAILS) TABS Take 1 tablet by mouth daily.      . polyvinyl alcohol (LIQUIFILM TEARS) 1.4 % ophthalmic solution Place 1 drop into both eyes daily as needed (for dry eyes).      . traMADol (ULTRAM) 50 MG tablet Take 50 mg by mouth 2 (two) times daily as needed for pain.       No current facility-administered medications for this visit.    Allergies as of 10/28/2013  . (No Known Allergies)    Vitals: BP 130/72  Pulse 60  Ht 5' 4.5" (1.638 m)  Wt 173 lb (78.472 kg)  BMI 29.25 kg/m2 Last Weight:  Wt Readings from Last 1 Encounters:  10/28/13 173 lb (78.472 kg)   Last Height:   Ht Readings from Last 1 Encounters:  10/28/13 5' 4.5" (1.638 m)     Physical  exam: Exam: Gen: NAD, conversant Eyes: anicteric sclerae, moist conjunctivae HENT: Atraumatic, oropharynx clear Neck: Trachea  midline; supple,  Lungs: CTA, no wheezing, rales, rhonic                          CV: RRR, no MRG Abdomen: Soft, non-tender;  Extremities: No peripheral edema  Skin: Normal temperature, no rash,  Psych: Appropriate affect, pleasant  Neuro: MS: AA&Ox3, appropriately interactive, normal affect   Speech: fluent w/o paraphasic error  Memory: good recent and remote recall  CN: PERRL, EOMI no nystagmus, no ptosis, sensation intact to LT V1-V3 bilat, mildly decreased right nasolabial fold and minimal asymmetry with smiling on the right, no weakness, hearing grossly intact, palate elevates symmetrically, shoulder shrug 5/5 bilat,  tongue protrudes midline, no fasiculations noted.  Motor: normal bulk and tone, noted right laterocollis, left torticollis, hypertrophy of right sternocleidomastoid, tenderness to palpation over bilateral trapezius, right levator scapulae Strength: Minimal 5 minus weakness on the right side both upper and lower extremity compared to left  Coord: rapid alternating and point-to-point (FNF, HTS) movements intact.  Reflexes: symmetrical, bilat downgoing toes  Sens: LT intact in all extremities  Gait: posture, stance, stride and arm-swing normal. Tandem gait intact. Able to walk on heels and toes. Romberg absent.   Assessment:  After physical and neurologic examination, review of laboratory studies, imaging, neurophysiology testing and pre-existing records, assessment will be reviewed on the problem list.  Plan:  Treatment plan and additional workup will be reviewed under Problem List.  1)Chronic pain 2)Cervical dystonia 3)R sided weakness  67 year old woman presenting for initial evaluation of chronic cervical neck pain. Physical exam pertinent for signs of cervical dystonia including right laterocolis, left torticollis, hypertrophy  of right sternocleidomastoid. Physical exam also notable for mild right-sided upper and lower extremity and facial weakness. This raises concern for possible central process involving left side of the brain. Discussed different therapeutic options with patient, as her neck pain as being different quality of life and ability to function will proceed with botulinum toxin therapy. We'll also check MRI of the brain for possible cause of right-sided weakness. Followup once insurance approval granted.  Elspeth Cho, DO  [facility], [facility], [facility] [address] [place], [place] [address]  Phone [phone] Fax [phone]

## 2013-10-28 NOTE — Patient Instructions (Signed)
Overall you are doing fairly well but I do want to suggest a few things today:   We will place a referral for Botox therapy to help treat your neck pain. Once this is approved, we will call you to schedule an appointment.  We will order a MRI of the brain to check for causes of your weakness  We will plan to see you back once insurance approval is given for the Botox injections, sooner if we need to. Please call us with any interim questions, concerns, problems, updates or refill requests.   Please also call us for any test results so we can go over those with you on the phone.  My clinical assistant and will answer any of your questions and relay your messages to me and also relay most of my messages to you.   Our phone number is [phone]. We also have an after hours call service for urgent matters and there is a physician on-call for urgent questions. For any emergencies you know to call 911 or go to the nearest emergency room

## 2013-11-01 ENCOUNTER — Encounter: Payer: Self-pay | Admitting: Gastroenterology

## 2013-11-11 ENCOUNTER — Inpatient Hospital Stay
Admission: RE | Admit: 2013-11-11 | Discharge: 2013-11-11 | Disposition: A | Discharge status: Home / Self Care | Payer: MEDICARE | Source: Ambulatory Visit | Attending: Neurology | Admitting: Neurology

## 2013-11-15 ENCOUNTER — Ambulatory Visit
Admission: RE | Admit: 2013-11-15 | Discharge: 2013-11-15 | Disposition: A | Discharge status: Home / Self Care | Payer: MEDICARE | Source: Ambulatory Visit | Attending: Neurology | Admitting: Neurology

## 2013-11-15 DIAGNOSIS — R531 Weakness: Secondary | ICD-10-CM

## 2013-11-16 ENCOUNTER — Encounter: Payer: MEDICARE | Admitting: Gastroenterology

## 2013-11-16 ENCOUNTER — Emergency Department
Admission: EM | Admit: 2013-11-16 | Discharge: 2013-11-16 | Disposition: A | Discharge status: Home / Self Care | Payer: MEDICARE | Source: Home / Self Care | Attending: Family Medicine | Admitting: Family Medicine

## 2013-11-16 ENCOUNTER — Encounter: Payer: Self-pay | Admitting: Emergency Medicine

## 2013-11-16 DIAGNOSIS — R197 Diarrhea, unspecified: Secondary | ICD-10-CM

## 2013-11-16 DIAGNOSIS — J069 Acute upper respiratory infection, unspecified: Secondary | ICD-10-CM

## 2013-11-16 MED ORDER — BENZONATATE 200 MG PO CAPS: 200.0000 mg | ORAL_CAPSULE | Freq: Every day | ORAL | Status: DC

## 2013-11-16 MED ORDER — AZITHROMYCIN 250 MG PO TABS: ORAL_TABLET | ORAL | Status: DC

## 2013-11-16 NOTE — ED Provider Notes (Signed)
CSN: [id number]     Arrival date & time 11/16/13  1052 History   First MD Initiated Contact with Patient 11/16/13 1112     Chief Complaint  Patient presents with  . Cough  . Diarrhea  . Headache      HPI Comments: Patient developed a mild sore throat 5 days ago, and the next day she developed myalgias, a headache, a cough and diarrhea that has persisted.  The cough is nonproductive and worse at night.  Denies recent foreign travel, or drinking untreated water in a wilderness environment.                                                                                           The history is provided by the patient.    Past Medical History  Diagnosis Date  . Chronic back pain   . Chronic neck pain   . Abnormal Pap smear   . Post-menopausal   . Sexual dysfunction   . Urinary incontinence   . Anemia   . Breast cancer   . Cataracts, bilateral   . Depression   . High cholesterol   . Goiter   . Complication of anesthesia     slow to wake up  . Hypertension   . Shortness of breath     with exertion  . Numbness     hands / feet /arms / legs - followed by Dr. Sol Passer - [place]  . Arthritis     back and neck  . Bruises easily   . Frequency of urination   . Abnormal peripheral vision     RT EYE   Past Surgical History  Procedure Laterality Date  . Lumpectomy  1997    Rt  . Cataract extraction, bilateral  [id number]  . Laparoscopy  1978    BTL  . Neck fusion  2010  . Biopsy thyroid    . Breast surgery    . Eye surgery  08/2012    detached retina surg  . Thyroidectomy N/A 05/04/2013    Procedure:  TOTAL THYROIDECTOMY;  Surgeon: Velora Heckler, MD;  Location: [facility];  Service: General;  Laterality: N/A;   Family History  Problem Relation Age of Onset  . Cervical cancer Daughter     dx in her 30s  . Heart disease Sister   . Heart disease Mother   . Diabetes Mother   . Hypertension Mother   . Colon cancer Father 71  . Stroke Sister   . Esophageal cancer Brother 41     smoker  . Diabetes Brother   . Heart disease Brother   . Hypertension Brother   . Asthma Maternal Grandmother   . Hypertension Sister   . Thyroid disease Sister   . Colon cancer Paternal Aunt 6  . Breast cancer Paternal Grandmother     dx in her 44s  . Thyroid disease Other 15    niece   History  Substance Use Topics  . Smoking status: Former Smoker -- 0.50 packs/day for 40 years    Types: Cigarettes    Quit date: 04/28/1967  . Smokeless tobacco:  Never Used  . Alcohol Use: Yes     Comment: 1-2 per month   OB History   Grav Para Term Preterm Abortions TAB SAB Ect Mult Living   3 3 3       3      Review of Systems + sore throat + cough No pleuritic pain No wheezing + nasal congestion + post-nasal drainage No sinus pain/pressure No itchy/red eyes No earache No hemoptysis No SOB No fever/chills No nausea No vomiting + abdominal pain + diarrhea No urinary symptoms No skin rash + fatigue + myalgias + headache Used OTC meds without relief  Allergies  Review of patient's allergies indicates no known allergies.  Home Medications   Current Outpatient Rx  Name  Route  Sig  Dispense  Refill  . acetaminophen (TYLENOL) 500 MG tablet   Oral   Take 500 mg by mouth every 6 (six) hours as needed for pain.         Mack Guise THYROID 30 MG tablet   Oral   Take 90 mg by mouth daily before breakfast.          . aspirin 81 MG tablet   Oral   Take 325 mg by mouth daily.          Marland Kitchen azithromycin (ZITHROMAX Z-PAK) 250 MG tablet      Take 2 tabs today; then begin one tab once daily for 4 more days.   6 each   0   . benzonatate (TESSALON) 200 MG capsule   Oral   Take 1 capsule (200 mg total) by mouth at bedtime. Take as needed for cough   12 capsule   0   . Cyanocobalamin (VITAMIN B 12 PO)   Oral   Take by mouth every morning.         . dorzolamide-timolol (COSOPT) 22.3-6.8 MG/ML ophthalmic solution   Both Eyes   Place 1 drop into both eyes 2 (two)  times daily.          . fish oil-omega-3 fatty acids 1000 MG capsule   Oral   Take 2 g by mouth daily.         Marland Kitchen gabapentin (NEURONTIN) 300 MG capsule   Oral   Take 300 mg by mouth 3 (three) times daily.         . hydrocortisone cream 1 %   Topical   Apply 1 application topically daily as needed (for itching skin).         Marland Kitchen ibuprofen (ADVIL,MOTRIN) 600 MG tablet   Oral   Take 1 tablet (600 mg total) by mouth every 8 (eight) hours as needed for pain.   30 tablet   0   . irbesartan (AVAPRO) 150 MG tablet   Oral   Take 1 tablet (150 mg total) by mouth daily.   90 tablet   1   . Multiple Vitamins-Minerals (HAIR/SKIN/NAILS) TABS   Oral   Take 1 tablet by mouth daily.         . polyvinyl alcohol (LIQUIFILM TEARS) 1.4 % ophthalmic solution   Both Eyes   Place 1 drop into both eyes daily as needed (for dry eyes).         . traMADol (ULTRAM) 50 MG tablet   Oral   Take 50 mg by mouth 2 (two) times daily as needed for pain.          BP 97/60  Pulse 71  Temp(Src) 98.4 F (36.9 C) (Oral)  Resp 18  Ht 5\' 4"  (1.626 m)  Wt 170 lb (77.111 kg)  BMI 29.17 kg/m2  SpO2 99% Physical Exam Nursing notes and Vital Signs reviewed. Appearance:  Patient appears healthy, stated age, and in no acute distress Eyes:  Pupils are equal, round, and reactive to light and accomodation.  Extraocular movement is intact.  Conjunctivae are not inflamed  Ears:  Canals normal.  Tympanic membranes normal.  Nose:  Mildly congested turbinates.  No sinus tenderness.   Pharynx:  Normal Neck:  Supple.  Slightly tender shotty posterior nodes are palpated bilaterally  Lungs:  Clear to auscultation.  Breath sounds are equal.  Heart:  Regular rate and rhythm without murmurs, rubs, or gallops.  Abdomen:  Nontender without masses or hepatosplenomegaly.  Bowel sounds are present.  No CVA or flank tenderness.  Extremities:  No edema.  No calf tenderness Skin:  No rash present.   ED Course   Procedures  none          MDM   1. Acute upper respiratory infections of unspecified site   2. Diarrhea    Begin Z-pack to cover atypical agents.  Prescription written for Benzonatate [facility]) to take at bedtime for night-time cough.  Begin clear liquids (adult rehydration solution while having diarrhea) until improved, then advance to a bland diet.  Then gradually resume a regular diet when tolerated.  Avoid milk products until well.  To decrease diarrhea, mix one heaping tablespoon Citrucel (methylcellulose) in 8 oz water and drink one to three times daily.  When stools become more formed, may take Imodium (loperamide) once or twice daily to decrease stool frequency.    Take plain Mucinex (1200 mg guaifenesin) twice daily for cough and congestion.  Increase fluid intake, rest. May use Afrin nasal spray (or generic oxymetazoline) twice daily for about 5 days.  Also recommend using saline nasal spray several times daily and saline nasal irrigation (AYR is a common brand) Stop all antihistamines for now, and other non-prescription cough/cold preparations. Followup with Family Doctor if not improved in one week.     Lattie Haw, MD 11/16/13 [phone]

## 2013-11-16 NOTE — ED Notes (Signed)
Pt c/o cough, nasal congestion, diarrhea, fatigue and HA x 5 days. Denies fever. She has taken Imodium with no relief.

## 2013-11-23 ENCOUNTER — Telehealth: Payer: Self-pay | Admitting: Neurology

## 2013-11-23 NOTE — Telephone Encounter (Signed)
Patient returning call about MRI results. Please call the patient back.

## 2013-11-23 NOTE — Telephone Encounter (Signed)
Patient notified normal MRI.

## 2013-11-30 ENCOUNTER — Telehealth: Payer: Self-pay | Admitting: *Deleted

## 2013-12-13 ENCOUNTER — Other Ambulatory Visit: Payer: MEDICARE | Admitting: Lab

## 2013-12-13 ENCOUNTER — Telehealth: Payer: Self-pay | Admitting: Hematology & Oncology

## 2013-12-13 ENCOUNTER — Ambulatory Visit: Payer: MEDICARE | Admitting: Hematology & Oncology

## 2013-12-13 NOTE — Telephone Encounter (Signed)
Pt moved 12-20-13 to 01-24-14. I offered her multiple dates and she kept moving further out.I left RN voice mail to triage

## 2013-12-20 ENCOUNTER — Ambulatory Visit: Payer: MEDICARE | Admitting: Hematology & Oncology

## 2013-12-20 ENCOUNTER — Other Ambulatory Visit: Payer: MEDICARE | Admitting: Lab

## 2013-12-27 ENCOUNTER — Other Ambulatory Visit: Payer: Self-pay | Admitting: Internal Medicine

## 2013-12-27 DIAGNOSIS — Z139 Encounter for screening, unspecified: Secondary | ICD-10-CM

## 2014-01-04 ENCOUNTER — Encounter: Payer: MEDICARE | Admitting: Gastroenterology

## 2014-01-24 ENCOUNTER — Other Ambulatory Visit (HOSPITAL_BASED_OUTPATIENT_CLINIC_OR_DEPARTMENT_OTHER): Payer: MEDICARE | Admitting: Lab

## 2014-01-24 ENCOUNTER — Other Ambulatory Visit: Payer: Self-pay | Admitting: Internal Medicine

## 2014-01-24 ENCOUNTER — Encounter: Payer: Self-pay | Admitting: Hematology & Oncology

## 2014-01-24 ENCOUNTER — Ambulatory Visit (HOSPITAL_BASED_OUTPATIENT_CLINIC_OR_DEPARTMENT_OTHER): Payer: MEDICARE | Admitting: Hematology & Oncology

## 2014-01-24 VITALS — BP 117/62 | HR 73 | Temp 98.1°F | Resp 14 | Ht 64.0 in | Wt 166.0 lb

## 2014-01-24 DIAGNOSIS — M810 Age-related osteoporosis without current pathological fracture: Secondary | ICD-10-CM

## 2014-01-24 DIAGNOSIS — C50911 Malignant neoplasm of unspecified site of right female breast: Secondary | ICD-10-CM

## 2014-01-24 DIAGNOSIS — C50919 Malignant neoplasm of unspecified site of unspecified female breast: Secondary | ICD-10-CM

## 2014-01-24 DIAGNOSIS — Z853 Personal history of malignant neoplasm of breast: Secondary | ICD-10-CM

## 2014-01-24 LAB — CBC WITH DIFFERENTIAL (CANCER CENTER ONLY)
BASO#: 0 10*3/uL (ref 0.0–0.2)
BASO%: 0.1 % (ref 0.0–2.0)
EOS%: 1.4 % (ref 0.0–7.0)
Eosinophils Absolute: 0.1 10*3/uL (ref 0.0–0.5)
HCT: 36.6 % (ref 34.8–46.6)
HGB: 12.4 g/dL (ref 11.6–15.9)
LYMPH#: 2.9 10*3/uL (ref 0.9–3.3)
LYMPH%: 40.7 % (ref 14.0–48.0)
MCH: 30 pg (ref 26.0–34.0)
MCHC: 33.9 g/dL (ref 32.0–36.0)
MCV: 89 fL (ref 81–101)
MONO#: 0.6 10*3/uL (ref 0.1–0.9)
MONO%: 7.6 % (ref 0.0–13.0)
NEUT#: 3.6 10*3/uL (ref 1.5–6.5)
NEUT%: 50.2 % (ref 39.6–80.0)
Platelets: 276 10*3/uL (ref 145–400)
RBC: 4.13 10*6/uL (ref 3.70–5.32)
RDW: 12.6 % (ref 11.1–15.7)
WBC: 7.2 10*3/uL (ref 3.9–10.0)

## 2014-01-24 NOTE — Progress Notes (Signed)
  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 We first saw her back in July of 2014. At that point, she had moved down here from [place].    History of early-stage breast cancer up in [place] and back in 1997. She had a lumpectomy. She had radiation therapy afterwards. Otherwise, she says there is no other therapy that she had.   Her last mammogram was was last here in January. She's due for one this week.  She's not had a bone density test. She is not taking vitamin D. I told her to take 2000 units of vitamin D daily.  She's had no cough. She's had no nausea vomiting. She's had a decent appetite. She's trying to exercise more.. She's not noted any rashes. She's had no headache. PHYSICAL EXAMINATION: Well-developed well-nourished white female in no obvious distress. Vital signs show temperature of 98.1. Pulse 73. Blood pressure 117/62. Weight is 160s pounds. Head and neck exam shows a normocephalic atraumatic skull . She is no ocular or oral lesions. Shows no palpable cervical or supraclavicular lymph nodes. Lungs are clear. Cardiac exam regular rate and rhythm with no murmurs rubs or bruits. Breast exam right breast with a lumpectomy at the 10:00 position. This is well-healed. No nodules or masses are noted in the right breast. There is no right axillary adenopathy. Left breast is with no masses edema or erythema. There is no left axillary adenopathy. Abdomen is soft. She has normal bowel sounds. She has no palpable liver or spleen. Extremities shows no lymphedema in the right arm. She has good range of motion of her joints. She has good strength. Neurological exam is nonfocal.  LABORATORY STUDIES:  White cell count is 7.2. Hemoglobin 12.4. Platelets count 276.   IMPRESSION:  She is a 68 year old white female with a history of early-stage breast cancer back in 1997. One  would think that she is cured of this.  We will go ahead and get a bone density test on her. We will see what the rest of her lab work is like.    We will see her back in 6 months.     Volanda Napoleon, MD 01/24/2014

## 2014-01-24 NOTE — Telephone Encounter (Signed)
Refill request

## 2014-01-25 LAB — COMPREHENSIVE METABOLIC PANEL
ALT: 15 U/L (ref 0–35)
AST: 13 U/L (ref 0–37)
Albumin: 4.1 g/dL (ref 3.5–5.2)
Alkaline Phosphatase: 68 U/L (ref 39–117)
BUN: 12 mg/dL (ref 6–23)
CO2: 22 mEq/L (ref 19–32)
Calcium: 9.7 mg/dL (ref 8.4–10.5)
Chloride: 99 mEq/L (ref 96–112)
Creatinine, Ser: 0.55 mg/dL (ref 0.50–1.10)
Glucose, Bld: 113 mg/dL — ABNORMAL HIGH (ref 70–99)
Potassium: 3.9 mEq/L (ref 3.5–5.3)
Sodium: 133 mEq/L — ABNORMAL LOW (ref 135–145)
Total Bilirubin: 0.4 mg/dL (ref 0.2–1.2)
Total Protein: 6.8 g/dL (ref 6.0–8.3)

## 2014-01-25 LAB — VITAMIN D 25 HYDROXY (VIT D DEFICIENCY, FRACTURES): Vit D, 25-Hydroxy: 48 ng/mL (ref 30–89)

## 2014-01-26 ENCOUNTER — Ambulatory Visit (HOSPITAL_BASED_OUTPATIENT_CLINIC_OR_DEPARTMENT_OTHER): Payer: MEDICARE

## 2014-01-28 ENCOUNTER — Telehealth: Payer: Self-pay | Admitting: *Deleted

## 2014-01-28 NOTE — Telephone Encounter (Signed)
Message copied by Rico Ala on Fri Jan 28, 2014  2:24 PM ------      Message from: Volanda Napoleon      Created: Tue Jan 25, 2014  6:52 AM       Call - labs and Vit D are ok!!  Make sure that you take Vit D 2000units a day!!  Laurey Arrow ------

## 2014-01-28 NOTE — Telephone Encounter (Signed)
Called patient to let her know that her labs and vitamin d are all ok.  Reminded patient to take Vitamin d 2000 u daily.

## 2014-01-31 ENCOUNTER — Ambulatory Visit (HOSPITAL_BASED_OUTPATIENT_CLINIC_OR_DEPARTMENT_OTHER)
Admission: RE | Admit: 2014-01-31 | Discharge: 2014-01-31 | Disposition: A | Discharge status: Home / Self Care | Payer: MEDICARE | Source: Ambulatory Visit | Attending: Internal Medicine | Admitting: Internal Medicine

## 2014-01-31 DIAGNOSIS — Z1231 Encounter for screening mammogram for malignant neoplasm of breast: Secondary | ICD-10-CM | POA: Insufficient documentation

## 2014-01-31 DIAGNOSIS — Z139 Encounter for screening, unspecified: Secondary | ICD-10-CM

## 2014-02-01 ENCOUNTER — Ambulatory Visit (AMBULATORY_SURGERY_CENTER): Payer: MEDICARE

## 2014-02-01 VITALS — Ht 64.0 in | Wt 167.2 lb

## 2014-02-01 DIAGNOSIS — Z800 Family history of malignant neoplasm of digestive organs: Secondary | ICD-10-CM

## 2014-02-01 MED ORDER — MOVIPREP 100 G PO SOLR: ORAL | Status: DC

## 2014-02-01 NOTE — Progress Notes (Signed)
Pt came into the office today for her pre-visit prior to her colonoscopy with Dr Fuller Plan on 02/15/14. Pt states she had a colonoscopy done over 5 years ago in [place], which was normal.(Unable to get results), but there is family hx of colon cancer .

## 2014-02-15 ENCOUNTER — Encounter: Payer: Self-pay | Admitting: Gastroenterology

## 2014-02-15 ENCOUNTER — Other Ambulatory Visit: Payer: MEDICARE

## 2014-02-15 ENCOUNTER — Ambulatory Visit (AMBULATORY_SURGERY_CENTER): Payer: MEDICARE | Admitting: Gastroenterology

## 2014-02-15 VITALS — BP 144/72 | HR 47 | Temp 97.7°F | Resp 23 | Ht 64.0 in | Wt 167.0 lb

## 2014-02-15 DIAGNOSIS — Z1211 Encounter for screening for malignant neoplasm of colon: Secondary | ICD-10-CM

## 2014-02-15 DIAGNOSIS — D126 Benign neoplasm of colon, unspecified: Secondary | ICD-10-CM

## 2014-02-15 DIAGNOSIS — Z800 Family history of malignant neoplasm of digestive organs: Secondary | ICD-10-CM

## 2014-02-15 HISTORY — PX: COLONOSCOPY: SHX174

## 2014-02-15 MED ORDER — SODIUM CHLORIDE 0.9 % IV SOLN: 500.0000 mL | INTRAVENOUS | Status: DC

## 2014-02-15 NOTE — Patient Instructions (Signed)
YOU HAD AN ENDOSCOPIC PROCEDURE TODAY AT [facility]: Refer to the procedure report that was given to you for any specific questions about what was found during the examination.  If the procedure report does not answer your questions, please call your gastroenterologist to clarify.  If you requested that your care partner not be given the details of your procedure findings, then the procedure report has been included in a sealed envelope for you to review at your convenience later.  YOU SHOULD EXPECT: Some feelings of bloating in the abdomen. Passage of more gas than usual.  Walking can help get rid of the air that was put into your GI tract during the procedure and reduce the bloating. If you had a lower endoscopy (such as a colonoscopy or flexible sigmoidoscopy) you may notice spotting of blood in your stool or on the toilet paper. If you underwent a bowel prep for your procedure, then you may not have a normal bowel movement for a few days.  DIET: Your first meal following the procedure should be a light meal and then it is ok to progress to your normal diet.  A half-sandwich or bowl of soup is an example of a good first meal.  Heavy or fried foods are harder to digest and may make you feel nauseous or bloated.  Likewise meals heavy in dairy and vegetables can cause extra gas to form and this can also increase the bloating.  Drink plenty of fluids but you should avoid alcoholic beverages for 24 hours.Try to eat a high fiber diet to help your hemorrhoids.  ACTIVITY: Your care partner should take you home directly after the procedure.  You should plan to take it easy, moving slowly for the rest of the day.  You can resume normal activity the day after the procedure however you should NOT DRIVE or use heavy machinery for 24 hours (because of the sedation medicines used during the test).    SYMPTOMS TO REPORT IMMEDIATELY: A gastroenterologist can be reached at any hour.  During normal  business hours, 8:30 AM to 5:00 PM Monday through Friday, call [phone].  After hours and on weekends, please call the GI answering service at [phone] who will take a message and have the physician on call contact you.   Following lower endoscopy (colonoscopy or flexible sigmoidoscopy):  Excessive amounts of blood in the stool  Significant tenderness or worsening of abdominal pains  Swelling of the abdomen that is new, acute  Fever of 100F or higher  FOLLOW UP: If any biopsies were taken you will be contacted by phone or by letter within the next 1-3 weeks.  Call your gastroenterologist if you have not heard about the biopsies in 3 weeks.  Our staff will call the home number listed on your records the next business day following your procedure to check on you and address any questions or concerns that you may have at that time regarding the information given to you following your procedure. This is a courtesy call and so if there is no answer at the home number and we have not heard from you through the emergency physician on call, we will assume that you have returned to your regular daily activities without incident.  SIGNATURES/CONFIDENTIALITY: You and/or your care partner have signed paperwork which will be entered into your electronic medical record.  These signatures attest to the fact that that the information above on your After Visit Summary has been reviewed and  is understood.  Full responsibility of the confidentiality of this discharge information lies with you and/or your care-partner.

## 2014-02-15 NOTE — Op Note (Addendum)
[facility]  [address] [place], [address]   COLONOSCOPY PROCEDURE REPORT PATIENT: [name] [name], [name] [name]  MR#: [id number] BIRTHDATE: February 20, 1946 , 3  yrs. old GENDER: Female ENDOSCOPIST: Ladene Artist, MD, [facility] REFERRED BY: PROCEDURE DATE:  02/15/2014 PROCEDURE:   Colonoscopy with snare polypectomy First Screening Colonoscopy - Avg.  risk and is 50 yrs.  old or older - No.  Prior Negative Screening - Now for repeat screening. Above average risk  History of Adenoma - Now for follow-up colonoscopy & has been > or = to 3 yrs.  N/A  Polyps Removed Today? Yes. ASA CLASS:   Class II INDICATIONS:Patient's immediate family history of colon cancer, father 16s, Patient's family history of colon cancer, distant relative, paternal aunt 39s, and Patient's family history of colon polyps, mother 12s. MEDICATIONS: MAC sedation, administered by CRNA and propofol (Diprivan) 200mg  IV DESCRIPTION OF PROCEDURE:   After the risks benefits and alternatives of the procedure were thoroughly explained, informed consent was obtained.  A digital rectal exam revealed no abnormalities of the rectum.   The LB [id number]  endoscope was introduced through the anus and advanced to the cecum, which was identified by both the appendix and ileocecal valve. No adverse events experienced.   The quality of the prep was excellent, using MoviPrep  The instrument was then slowly withdrawn as the colon was fully examined.  COLON FINDINGS: A sessile polyp measuring 1.2 cm in size was found at the cecum.  A polypectomy was performed using snare cautery. The resection was complete and the polyp tissue was completely retrieved.   A sessile polyp measuring 6 mm in size was found in the transverse colon.  A polypectomy was performed with a cold snare.  The resection was complete and the polyp tissue was completely retrieved.   Mild diverticulosis was noted in the sigmoid colon.   The colon was otherwise  normal.  There was no diverticulosis, inflammation, polyps or cancers unless previously stated.  Retroflexed views revealed small  internal hemorrhoids. The time to cecum=2 minutes 56 seconds.  Withdrawal time=10 minutes 44 seconds.  The scope was withdrawn and the procedure completed. COMPLICATIONS: There were no complications. ENDOSCOPIC IMPRESSION: 1.   Sessile polyp measuring 1.2 cmat the cecum; polypectomy performed using snare cautery 2.   Sessile polyp measuring 6 mm in the transverse colon; polypectomy performed with a cold snare 3.   Mild diverticulosis in the sigmoid colon 4.   Small internal hemorrhoids  RECOMMENDATIONS: 1.  Await pathology results 2.  Hold aspirin, aspirin products, and anti-inflammatory medication for 2 weeks. 3.  Repeat colonoscopy in 3 years if the larger polyp is adenomatous; otherwise 5 years  eSigned:  Ladene Artist, MD, [facility] 02/15/2014 9:39 AM

## 2014-02-15 NOTE — Progress Notes (Signed)
Called to room to assist during endoscopic procedure.  Patient ID and intended procedure confirmed with present staff. Received instructions for my participation in the procedure from the performing physician.  

## 2014-02-15 NOTE — Progress Notes (Signed)
Lidocaine-40mg IV prior to Propofol InductionPropofol given over incremental dosages 

## 2014-02-16 ENCOUNTER — Ambulatory Visit
Admission: RE | Admit: 2014-02-16 | Discharge: 2014-02-16 | Disposition: A | Discharge status: Home / Self Care | Payer: Self-pay | Source: Ambulatory Visit | Attending: Hematology & Oncology | Admitting: Hematology & Oncology

## 2014-02-16 ENCOUNTER — Telehealth: Payer: Self-pay | Admitting: *Deleted

## 2014-02-16 DIAGNOSIS — M810 Age-related osteoporosis without current pathological fracture: Secondary | ICD-10-CM

## 2014-02-16 NOTE — Telephone Encounter (Signed)
  Follow up Call-  Call back number 02/15/2014  Post procedure Call Back phone  # [phone]  Permission to leave phone message Yes     Patient questions:  Do you have a fever, pain , or abdominal swelling? no Pain Score  0 *  Have you tolerated food without any problems? yes  Have you been able to return to your normal activities? yes  Do you have any questions about your discharge instructions: Diet   no Medications  no Follow up visit  no  Do you have questions or concerns about your Care? no  Actions: * If pain score is 4 or above: No action needed, pain <4. Some gas instructed to drink warm fluids and walk.

## 2014-02-18 ENCOUNTER — Telehealth: Payer: Self-pay | Admitting: *Deleted

## 2014-02-18 NOTE — Telephone Encounter (Signed)
Message copied by Conley Rolls on Fri Feb 18, 2014  8:33 AM ------      Message from: Emi Belfast D      Created: Thu Feb 17, 2014 11:41 AM       Jolayne Haines            Call pt and let her know that her bone density shows early thinning  - osteopenia .  She  Does not have osteoporosis.  Take calcium 1200-1500 mg daily with [id number] mg vitamin d ------

## 2014-02-18 NOTE — Telephone Encounter (Signed)
Notified pt of DEXA results and recomendation to continue taking her vit D and to start taking Calcium

## 2014-02-23 ENCOUNTER — Encounter: Payer: Self-pay | Admitting: Hematology & Oncology

## 2014-02-27 ENCOUNTER — Encounter: Payer: Self-pay | Admitting: Gastroenterology

## 2014-03-24 NOTE — Telephone Encounter (Signed)
error 

## 2014-06-07 ENCOUNTER — Telehealth: Payer: Self-pay | Admitting: *Deleted

## 2014-06-07 ENCOUNTER — Other Ambulatory Visit: Payer: Self-pay | Admitting: *Deleted
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
## 2014-06-08 MED ORDER — IRBESARTAN 150 MG PO TABS: 150.0000 mg | ORAL_TABLET | Freq: Every day | ORAL | Status: DC

## 2014-07-25 ENCOUNTER — Telehealth: Payer: Self-pay | Admitting: Hematology & Oncology

## 2014-07-25 NOTE — Telephone Encounter (Signed)
Pt is out of town will call to reschedule 9-2

## 2014-07-27 ENCOUNTER — Other Ambulatory Visit: Payer: MEDICARE | Admitting: Lab

## 2014-07-27 ENCOUNTER — Ambulatory Visit: Payer: MEDICARE | Admitting: Hematology & Oncology

## 2014-09-06 ENCOUNTER — Other Ambulatory Visit: Payer: Self-pay | Admitting: *Deleted

## 2014-09-06 ENCOUNTER — Telehealth: Payer: Self-pay

## 2014-09-06 MED ORDER — IRBESARTAN 150 MG PO TABS: 150.0000 mg | ORAL_TABLET | Freq: Every day | ORAL | Status: DC
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
## 2014-09-06 NOTE — Telephone Encounter (Signed)
Sent message to Dr. Shirline Frees
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
## 2014-09-06 NOTE — Telephone Encounter (Signed)
Error

## 2014-09-06 NOTE — Telephone Encounter (Signed)
Patient is requesting a 90 day supply

## 2014-09-07 ENCOUNTER — Other Ambulatory Visit: Payer: Self-pay | Admitting: Internal Medicine

## 2014-09-07 MED ORDER — GABAPENTIN 300 MG PO CAPS: 300.0000 mg | ORAL_CAPSULE | Freq: Three times a day (TID) | ORAL | Status: DC

## 2014-09-07 NOTE — Telephone Encounter (Signed)
[name] [name]  I would rather have pt take her neurontin that Advil on a daily basis   See my prior note I sent to you

## 2014-09-07 NOTE — Telephone Encounter (Signed)
She is taking the neurontin 300mg  TID

## 2014-09-26 ENCOUNTER — Encounter: Payer: Self-pay | Admitting: Gastroenterology

## 2014-11-08 ENCOUNTER — Ambulatory Visit (INDEPENDENT_AMBULATORY_CARE_PROVIDER_SITE_OTHER): Payer: MEDICARE | Admitting: Neurology

## 2014-11-08 ENCOUNTER — Encounter: Payer: Self-pay | Admitting: Neurology

## 2014-11-08 VITALS — BP 115/70 | HR 51 | Ht 64.5 in | Wt 164.0 lb

## 2014-11-08 DIAGNOSIS — G243 Spasmodic torticollis: Secondary | ICD-10-CM

## 2014-11-08 DIAGNOSIS — R531 Weakness: Secondary | ICD-10-CM

## 2014-11-08 DIAGNOSIS — M6289 Other specified disorders of muscle: Secondary | ICD-10-CM

## 2014-11-08 MED ORDER — CELECOXIB 100 MG PO CAPS: 100.0000 mg | ORAL_CAPSULE | Freq: Two times a day (BID) | ORAL | Status: DC

## 2014-11-08 NOTE — Progress Notes (Signed)
[facility]   HPI:  [name] [name] is a 68 y.o. female , follow-up for chronic neck pain, cervical dystonia, her primary care is  Dr. Coralyn Mark   She had a history of cervical decompression surgery in 2010, C5-6 fusion, this was done in [place], which did help her severe neck pain, radiating pain to her arm, however, even with the surgery, she continue complains of neck pain, heaviness radiating to her bilateral shoulder, radiating pain to her arm, subjective leg weakness, difficulty holding heavy object. She was previously self medicated with multiple repeat dose of Motrin, has caused stomach problem   MRI from 2013 of cervical spine shows prior fusion with anterior fixation the C5-6 level, some increased signal in T2 and STIR and anterior spinal cord at that level consider myelopathic change  Normal MRI brain, post operative changes of bilateral orbits in 10/2013.  She was evaluated by Dr. Janann Colonel in December 2014, there was noticeable cervical dystonia, Botox was suggested, however, she traveled to [place] for few months, has missed her follow-up appointment.   Now she came in with similar complains of mild baseline gait difficulty, bilateral shoulder achy pain, neck pain, bilateral arm weakness, difficulty holding heavy object.  Review of Systems: Out of a complete 14 system review, the patient complains of only the following symptoms, and all other reviewed systems are negative. As above    History   Social History  . Marital Status: Single    Spouse Name: N/A    Number of Children: 3  . Years of Education: 14   Occupational History  . retired    Social History Main Topics  . Smoking status: Former Smoker -- 0.50 packs/day for 3 years    Types: Cigarettes    Start date: 01/24/1966    Quit date: 04/27/1968  . Smokeless tobacco: Never Used     Comment: quit over 40 years ago  . Alcohol Use: No     Comment: 1-2 per month  . Drug Use: No  . Sexual  Activity: Not Currently   Other Topics Concern  . Not on file   Social History Narrative   Patient is single and lives alone.   Patient has 3 children.   Patient is retired.   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   Patient is right handed.   Patient drinks two cups of coffee daily and rarely sodas.    Family History  Problem Relation Age of Onset  . Cervical cancer Daughter     dx in her 4s  . Heart disease Sister   . Heart disease Mother   . Diabetes Mother   . Hypertension Mother   . Colon cancer Father 25  . Stroke Sister   . Esophageal cancer Brother 61    smoker  . Diabetes Brother   . Heart disease Brother   . Hypertension Brother   . Asthma Maternal Grandmother   . Hypertension Sister   . Thyroid disease Sister   . Colon cancer Paternal Aunt 89  . Breast cancer Paternal Grandmother     dx in her 26s  . Thyroid disease Other 1    niece    Past Medical History  Diagnosis Date  . Chronic back pain   . Chronic neck pain   . Abnormal Pap smear   . Post-menopausal   . Sexual dysfunction   . Urinary incontinence   . Anemia   . Breast cancer     right breast  .  Cataracts, bilateral   . Depression   . High cholesterol   . Goiter   . Complication of anesthesia     slow to wake up  . Hypertension   . Shortness of breath     with exertion  . Numbness     hands / feet /arms / legs - followed by Dr. Georgann Housekeeper - [place]  . Arthritis     back and neck  . Bruises easily   . Frequency of urination   . Abnormal peripheral vision     RT EYE  . History of postoperative nausea and vomiting     Past Surgical History  Procedure Laterality Date  . Lumpectomy  1997    Rt  . Cataract extraction, bilateral  [id number]  . Laparoscopy  1978    BTL  . Neck fusion  2010  . Biopsy thyroid    . Breast surgery    . Eye surgery  08/2012    detached retina surg  . Thyroidectomy N/A 05/04/2013    Procedure:  TOTAL THYROIDECTOMY;  Surgeon: Earnstine Regal, MD;  Location: [facility];  Service: General;  Laterality: N/A;    Current Outpatient Prescriptions  Medication Sig Dispense Refill  . acetaminophen (TYLENOL) 500 MG tablet Take 500 mg by mouth every 6 (six) hours as needed for pain.    Francia Greaves THYROID 30 MG tablet Take 90 mg by mouth daily before breakfast.     . ARMOUR THYROID 60 MG tablet 60 mg daily.    Marland Kitchen aspirin 81 MG tablet Take 81 mg by mouth as needed.     . cholecalciferol (VITAMIN D) 1000 UNITS tablet Take 2,000 Units by mouth daily.    . Cyanocobalamin (VITAMIN B 12 PO) Take by mouth every morning.    . dorzolamide-timolol (COSOPT) 22.3-6.8 MG/ML ophthalmic solution Place 1 drop into both eyes 2 (two) times daily.     . fish oil-omega-3 fatty acids 1000 MG capsule Take 2 g by mouth daily.    Marland Kitchen gabapentin (NEURONTIN) 300 MG capsule Take 1 capsule (300 mg total) by mouth 3 (three) times daily. 90 capsule 2  . hydrocortisone cream 1 % Apply 1 application topically daily as needed (for itching skin).    Marland Kitchen ibuprofen (ADVIL,MOTRIN) 600 MG tablet Take 1 tablet (600 mg total) by mouth every 8 (eight) hours as needed for pain. 30 tablet 0  . irbesartan (AVAPRO) 150 MG tablet Take 1 tablet (150 mg total) by mouth daily. 90 tablet 0  . Misc Natural Products (LUTEIN 20 PO) Take by mouth daily.    . Multiple Vitamins-Minerals (HAIR/SKIN/NAILS) TABS Take 1 tablet by mouth daily.    . polyvinyl alcohol (LIQUIFILM TEARS) 1.4 % ophthalmic solution Place 1 drop into both eyes daily as needed (for dry eyes).    . traMADol (ULTRAM) 50 MG tablet Take 50 mg by mouth 2 (two) times daily as needed for pain.     No current facility-administered medications for this visit.    Allergies as of 11/08/2014  . (Not on File)    Vitals: BP 115/70 mmHg  Pulse 51  Ht 5' 4.5" (1.638 m)  Wt 164 lb (74.39 kg)  BMI 27.73 kg/m2 Last Weight:  Wt Readings from Last 1 Encounters:  11/08/14 164 lb (74.39 kg)   Last Height:   Ht Readings from Last 1  Encounters:  11/08/14 5' 4.5" (1.638 m)   PHYSICAL EXAMINATOINS:  Generalized: In no acute distress  Neck: Supple,  no carotid bruits   Cardiac: Regular rate rhythm  Pulmonary: Clear to auscultation bilaterally  Musculoskeletal: No deformity  Neurological examination  Mentation: Alert oriented to time, place, history taking, and causual conversation, she has left neck tilt, occasionally no no shaking movement   Cranial nerve II-XII: Pupils were equal round reactive to light extraocular movements were full, visual field were full on confrontational test.  Bilateral fundi were sharp  Facial sensation and strength were normal. hearing was intact to finger rubbing bilaterally. Uvula tongue midline.  head turning and shoulder shrug and were normal and symmetric.Tongue protrusion into cheek strength was normal.  Motor: normal tone, bulk and strength.  Sensory: Intact to fine touch, pinprick, preserved vibratory sensation, and proprioception at toes.  Coordination: Normal finger to nose, heel-to-shin bilaterally there was no truncal ataxia  Gait: Rising up from seated position without assistance, normal stance, without trunk ataxia, moderate stride, good arm swing, smooth turning, able to perform tiptoe, and heel walking without difficulty.   Romberg signs: Negative  Deep tendon reflexes: Brachioradialis 2/2, biceps 2/2, triceps 2/2, patellar  3/3, Achilles 2/2, plantar responses were flexor bilaterally.   Assessment/Plan:  68 years old female, with previous history of cervical decompression, C5-6 fusion surgery in the past, presenting with continued neck pain, on examination, she has cervical dystonia, mild to moderate left tilt, intermittent no no neck shaking.  1, Celebrex 100 mg as needed 2. She wants to wait for next year when her new medical insurance take effect, we may initiate preauthorized [place] for Botox injection 3. RTC in 2 months.  Marcial Pacas, M.D. Ph.D.  [facility] [place], [place] [address] Phone: [phone] Fax:      [phone]

## 2014-12-03 ENCOUNTER — Other Ambulatory Visit: Payer: Self-pay | Admitting: Internal Medicine

## 2014-12-05 NOTE — Telephone Encounter (Signed)
Refill request

## 2015-01-10 ENCOUNTER — Ambulatory Visit: Payer: MEDICARE | Admitting: Neurology

## 2015-02-08 ENCOUNTER — Encounter: Payer: MEDICARE | Admitting: Internal Medicine

## 2015-03-02 ENCOUNTER — Telehealth: Payer: Self-pay | Admitting: Neurology

## 2015-03-02 ENCOUNTER — Encounter: Payer: Self-pay | Admitting: Neurology

## 2015-03-02 ENCOUNTER — Ambulatory Visit (INDEPENDENT_AMBULATORY_CARE_PROVIDER_SITE_OTHER): Payer: Medicare Other | Admitting: Neurology

## 2015-03-02 VITALS — BP 143/73 | HR 61 | Ht 64.5 in | Wt 167.0 lb

## 2015-03-02 DIAGNOSIS — R531 Weakness: Secondary | ICD-10-CM

## 2015-03-02 DIAGNOSIS — G8929 Other chronic pain: Secondary | ICD-10-CM

## 2015-03-02 DIAGNOSIS — M6289 Other specified disorders of muscle: Secondary | ICD-10-CM

## 2015-03-02 MED ORDER — TRAMADOL HCL 50 MG PO TABS: 50.0000 mg | ORAL_TABLET | Freq: Three times a day (TID) | ORAL | Status: DC

## 2015-03-02 MED ORDER — GABAPENTIN 300 MG PO CAPS: 300.0000 mg | ORAL_CAPSULE | Freq: Three times a day (TID) | ORAL | Status: DC

## 2015-03-02 NOTE — Telephone Encounter (Signed)
(  [facility])

## 2015-03-02 NOTE — Progress Notes (Signed)
[facility]   HPI:  [name] [name] is a 69 y.o. female , follow-up for chronic neck pain, cervical dystonia, her primary care is  Dr. Coralyn Mark   She had a history of cervical decompression surgery in 2010, C5-6 fusion, this was done in [place], which did help her severe neck pain, radiating pain to her arm, however, even with the surgery, she continue complains of neck pain, heaviness radiating to her bilateral shoulder, radiating pain to her arm, subjective leg weakness, difficulty holding heavy object. She was previously self medicated with multiple repeat dose of Motrin, has caused stomach problem   MRI from 2013 of cervical spine shows prior fusion with anterior fixation the C5-6 level, some increased signal in T2 and STIR and anterior spinal cord at that level consider myelopathic change  Normal MRI brain, post operative changes of bilateral orbits in 10/2013.  She was evaluated by Dr. Janann Colonel in December 2014, there was noticeable cervical dystonia, Botox was suggested, however, she traveled to [place] for few months, has missed her follow-up appointment.   Now she came in with similar complains of mild baseline gait difficulty, bilateral shoulder achy pain, neck pain, bilateral arm weakness, difficulty holding heavy object  UPDATE Apirl 7th 2016: She still only take gabapentin 300 mg 3 times a day, never tried celebrex 100mg  bid, insurance does not pay for it.  Mortrin prn was helpful.  She still complains of neck and arm pain, intermittent right arm paresthesia, her symptoms is overall manageable with current dose of gabapentin 300 mg 3 times a day, tramadol as needed, aspirin sometimes, NSAIDs as needed.  Review of Systems: Out of a complete 14 system review, the patient complains of only the following symptoms, and all other reviewed systems are negative. As above    History   Social History  . Marital Status: Single    Spouse Name: N/A  . Number of  Children: 3  . Years of Education: 14   Occupational History  . retired    Social History Main Topics  . Smoking status: Former Smoker -- 0.50 packs/day for 3 years    Types: Cigarettes    Start date: 01/24/1966    Quit date: 04/27/1968  . Smokeless tobacco: Never Used     Comment: quit over 40 years ago  . Alcohol Use: No     Comment: 1-2 per month  . Drug Use: No  . Sexual Activity: Not Currently   Other Topics Concern  . Not on file   Social History Narrative   Patient is single and lives alone.   Patient has 3 children.   Patient is retired.   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   Patient is right handed.   Patient drinks two cups of coffee daily and rarely sodas.    Family History  Problem Relation Age of Onset  . Cervical cancer Daughter     dx in her 34s  . Heart disease Sister   . Heart disease Mother   . Diabetes Mother   . Hypertension Mother   . Colon cancer Father 100  . Stroke Sister   . Esophageal cancer Brother 65    smoker  . Diabetes Brother   . Heart disease Brother   . Hypertension Brother   . Asthma Maternal Grandmother   . Hypertension Sister   . Thyroid disease Sister   . Colon cancer Paternal Aunt 89  . Breast cancer Paternal Grandmother     dx  in her 47s  . Thyroid disease Other 62    niece    Past Medical History  Diagnosis Date  . Chronic back pain   . Chronic neck pain   . Abnormal Pap smear   . Post-menopausal   . Sexual dysfunction   . Urinary incontinence   . Anemia   . Breast cancer     right breast  . Cataracts, bilateral   . Depression   . High cholesterol   . Goiter   . Complication of anesthesia     slow to wake up  . Hypertension   . Shortness of breath     with exertion  . Numbness     hands / feet /arms / legs - followed by Dr. Georgann Housekeeper - [place]  . Arthritis     back and neck  . Bruises easily   . Frequency of urination   . Abnormal peripheral vision     RT EYE  . History of  postoperative nausea and vomiting     Past Surgical History  Procedure Laterality Date  . Lumpectomy  1997    Rt  . Cataract extraction, bilateral  [id number]  . Laparoscopy  1978    BTL  . Neck fusion  2010  . Biopsy thyroid    . Breast surgery    . Eye surgery  08/2012    detached retina surg  . Thyroidectomy N/A 05/04/2013    Procedure:  TOTAL THYROIDECTOMY;  Surgeon: Earnstine Regal, MD;  Location: [facility];  Service: General;  Laterality: N/A;    Current Outpatient Prescriptions  Medication Sig Dispense Refill  . acetaminophen (TYLENOL) 500 MG tablet Take 500 mg by mouth every 6 (six) hours as needed for pain.    Francia Greaves THYROID 60 MG tablet 60 mg daily.    Marland Kitchen aspirin 81 MG tablet Take 81 mg by mouth as needed.     . cholecalciferol (VITAMIN D) 1000 UNITS tablet Take 2,000 Units by mouth daily.    . Cyanocobalamin (VITAMIN B 12 PO) Take by mouth every morning.    . dorzolamide-timolol (COSOPT) 22.3-6.8 MG/ML ophthalmic solution Place 1 drop into both eyes 2 (two) times daily.     . fish oil-omega-3 fatty acids 1000 MG capsule Take 2 g by mouth daily.    Marland Kitchen gabapentin (NEURONTIN) 300 MG capsule Take 1 capsule (300 mg total) by mouth 3 (three) times daily. 90 capsule 2  . hydrocortisone cream 1 % Apply 1 application topically daily as needed (for itching skin).    Marland Kitchen ibuprofen (ADVIL,MOTRIN) 600 MG tablet Take 1 tablet (600 mg total) by mouth every 8 (eight) hours as needed for pain. 30 tablet 0  . irbesartan (AVAPRO) 150 MG tablet Take 1 tablet (150 mg total) by mouth daily. 90 tablet 0  . Misc Natural Products (LUTEIN 20 PO) Take by mouth daily.    . Multiple Vitamins-Minerals (HAIR/SKIN/NAILS) TABS Take 1 tablet by mouth daily.    . polyvinyl alcohol (LIQUIFILM TEARS) 1.4 % ophthalmic solution Place 1 drop into both eyes daily as needed (for dry eyes).    . traMADol (ULTRAM) 50 MG tablet Take 50 mg by mouth 2 (two) times daily as needed for pain.     No current  facility-administered medications for this visit.    Allergies as of 03/02/2015  . (Not on File)    Vitals: BP 143/73 mmHg  Pulse 61  Ht 5' 4.5" (1.638 m)  Wt 167 lb (  75.751 kg)  BMI 28.23 kg/m2 Last Weight:  Wt Readings from Last 1 Encounters:  03/02/15 167 lb (75.751 kg)   Last Height:   Ht Readings from Last 1 Encounters:  03/02/15 5' 4.5" (1.638 m)   PHYSICAL EXAMINATOINS: PHYSICAL EXAMNIATION:  Gen: NAD, conversant, well nourised, obese, well groomed                     Cardiovascular: Regular rate rhythm, no peripheral edema, warm, nontender. Eyes: Conjunctivae clear without exudates or hemorrhage Neck: Supple, no carotid bruise. Pulmonary: Clear to auscultation bilaterally   NEUROLOGICAL EXAM:  MENTAL STATUS: Speech:    Speech is normal; fluent and spontaneous with normal comprehension.  Cognition:    The patient is oriented to person, place, and time;     recent and remote memory intact;     language fluent;     normal attention, concentration,     fund of knowledge.  CRANIAL NERVES: CN II: Visual fields are full to confrontation. Fundoscopic exam is normal with sharp discs and no vascular changes. Venous pulsations are present bilaterally. Pupils are 4 mm and briskly reactive to light. Visual acuity is 20/20 bilaterally. CN III, IV, VI: extraocular movement are normal. No ptosis. CN V: Facial sensation is intact to pinprick in all 3 divisions bilaterally. Corneal responses are intact.  CN VII: Face is symmetric with normal eye closure and smile. CN VIII: Hearing is normal to rubbing fingers CN IX, X: Palate elevates symmetrically. Phonation is normal. CN XI: Head turning and shoulder shrug are intact CN XII: Tongue is midline with normal movements and no atrophy.  MOTOR: There is no pronator drift of out-stretched arms. Muscle bulk and tone are normal. Muscle strength is normal.   Shoulder abduction Shoulder external rotation Elbow flexion Elbow  extension Wrist flexion Wrist extension Finger abduction Hip flexion Knee flexion Knee extension Ankle dorsi flexion Ankle plantar flexion  R 5 5 5 5 5 5 5 5 5 5 5 5   L 5 5 5 5 5 5 5 5 5 5 5 5     REFLEXES: Reflexes are 2+ and symmetric at the biceps, triceps, knees, and ankles. Plantar responses are flexor.  SENSORY: Light touch, pinprick, position sense, and vibration sense are intact in fingers and toes.  COORDINATION: Rapid alternating movements and fine finger movements are intact. There is no dysmetria on finger-to-nose and heel-knee-shin. There are no abnormal or extraneous movements.   GAIT/STANCE: Posture is normal. Gait is steady with normal steps, base, arm swing, and turning. Heel and toe walking are normal. Tandem gait is normal.  Romberg is absent.   Assessment/Plan:  69 years old female, with previous history of cervical decompression, C5-6 fusion surgery in the past, presenting with continued neck pain, o  1. continue Gabapentin 300mg  tid, prn Tramadol, NSAIDs, Asa 81mg  daily. 2. RTC in 4-6 months with Hoyle Sauer, if her symptoms are stable,  We may discharge her to her PCP,  if she continues to have neck pain, may consider add on cymbalta.    Marcial Pacas, M.D. Ph.D.  [facility] [place], [place] [address] Phone: [phone] Fax:      [phone]

## 2015-03-02 NOTE — Telephone Encounter (Signed)
Rx has been resent to [facility] per patient request.  I called back to advise.  Got no answer.  Left message.

## 2015-03-02 NOTE — Telephone Encounter (Signed)
Pt is calling stating her Rx for gabapentin (NEURONTIN) 300 MG capsule was faxed to [facility] in [place], [facility] [address] in [place] and it should be for a 90 day supply.  She wants to know if this can be done as soon as possible?  Please call and advise.

## 2015-03-07 ENCOUNTER — Encounter: Payer: Self-pay | Admitting: *Deleted

## 2015-03-20 ENCOUNTER — Other Ambulatory Visit: Payer: Self-pay | Admitting: Internal Medicine

## 2015-03-20 NOTE — Telephone Encounter (Signed)
Refill request

## 2015-04-17 ENCOUNTER — Encounter: Payer: Self-pay | Admitting: Internal Medicine

## 2015-04-27 ENCOUNTER — Ambulatory Visit: Payer: Self-pay | Admitting: Family Medicine

## 2015-06-29 ENCOUNTER — Ambulatory Visit (INDEPENDENT_AMBULATORY_CARE_PROVIDER_SITE_OTHER): Payer: Medicare Other | Admitting: Neurology

## 2015-06-29 ENCOUNTER — Ambulatory Visit: Payer: Medicare Other | Admitting: Neurology

## 2015-06-29 ENCOUNTER — Encounter: Payer: Self-pay | Admitting: Neurology

## 2015-06-29 VITALS — BP 119/76 | HR 58 | Ht 64.5 in | Wt 167.0 lb

## 2015-06-29 DIAGNOSIS — R531 Weakness: Secondary | ICD-10-CM

## 2015-06-29 DIAGNOSIS — M6289 Other specified disorders of muscle: Secondary | ICD-10-CM | POA: Diagnosis not present

## 2015-06-29 DIAGNOSIS — G8929 Other chronic pain: Secondary | ICD-10-CM | POA: Diagnosis not present

## 2015-06-29 DIAGNOSIS — R413 Other amnesia: Secondary | ICD-10-CM

## 2015-06-29 MED ORDER — GABAPENTIN 300 MG PO CAPS: 300.0000 mg | ORAL_CAPSULE | Freq: Three times a day (TID) | ORAL | Status: DC

## 2015-06-29 MED ORDER — TRAMADOL HCL 50 MG PO TABS: 50.0000 mg | ORAL_TABLET | Freq: Three times a day (TID) | ORAL | Status: DC

## 2015-06-29 NOTE — Progress Notes (Addendum)
Chief Complaint  Patient presents with  . Cervical Dystonia    She is careful to limit the activities that causes her pain to worsen.  Overall, she feels her current medications keep symptoms tolerable. Sometimes her medications may take longer to work if the flare-up is exceptionally bad.    [facility]   HPI:  [name] [name] is a 69 y.o. female , follow-up for chronic neck pain, cervical dystonia, her primary care is  Dr. Coralyn Mark   She had a history of cervical decompression surgery in 2010, C5-6 fusion, this was done in [place], which did help her severe neck pain, radiating pain to her arm, however, even with the surgery, she continue complains of neck pain, heaviness radiating to her bilateral shoulder, radiating pain to her arm, subjective leg weakness, difficulty holding heavy object. She was previously self medicated with multiple repeat dose of Motrin, has caused stomach problem   MRI from 2013 of cervical spine (pots cervical decompression fusion surgery) evidence of fusion with anterior fixation the C5-6 level, some increased signal in T2 and STIR and anterior spinal cord at that level consider myelopathic change  Normal MRI brain, post operative changes of bilateral orbits in 10/2013.  She was evaluated by Dr. Janann Colonel in December 2014, there was noticeable cervical dystonia, Botox was suggested, however, she traveled to [place] for few months, has missed her follow-up appointment.   Now she came in with similar complains of mild baseline gait difficulty, bilateral shoulder achy pain, neck pain, bilateral arm weakness, difficulty holding heavy object  UPDATE Apirl 7th 2016: She still only take gabapentin 300 mg 3 times a day, never tried celebrex 100mg  bid, insurance does not pay for it.  Mortrin prn was helpful.  She still complains of neck and arm pain, intermittent right arm paresthesia, her symptoms is overall manageable with current dose of gabapentin 300  mg 3 times a day, tramadol as needed, aspirin sometimes, NSAIDs as needed.  UPDATE August 4th 2016: She continue have mild neck pain, radiating pain down to bilateral shoulder, mild unsteady gait, sometimes mild memory trouble,  But she still highly function, driving long distance to [place], where she just sold her house   We have reviewed MRI of the brain without contrast December 2014 that was normal  Review of Systems: Out of a complete 14 system review, the patient complains of only the following symptoms, and all other reviewed systems are negative.  Fatigue, heat intolerance, excessive thirst, swollen abdomen, frequent urinations, urgency, memory loss  History   Social History  . Marital Status: Single    Spouse Name: N/A  . Number of Children: 3  . Years of Education: 14   Occupational History  . retired    Social History Main Topics  . Smoking status: Former Smoker -- 0.50 packs/day for 3 years    Types: Cigarettes    Start date: 01/24/1966    Quit date: 04/27/1968  . Smokeless tobacco: Never Used     Comment: quit over 40 years ago  . Alcohol Use: No     Comment: 1-2 per month  . Drug Use: No  . Sexual Activity: Not Currently   Other Topics Concern  . Not on file   Social History Narrative   Patient is single and lives alone.   Patient has 3 children.   Patient is retired.   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   Patient is right handed.   Patient drinks two cups  of coffee daily and rarely sodas.    Family History  Problem Relation Age of Onset  . Cervical cancer Daughter     dx in her 65s  . Heart disease Sister   . Heart disease Mother   . Diabetes Mother   . Hypertension Mother   . Colon cancer Father 43  . Stroke Sister   . Esophageal cancer Brother 71    smoker  . Diabetes Brother   . Heart disease Brother   . Hypertension Brother   . Asthma Maternal Grandmother   . Hypertension Sister   . Thyroid disease Sister   .  Colon cancer Paternal Aunt 89  . Breast cancer Paternal Grandmother     dx in her 72s  . Thyroid disease Other 22    niece    Past Medical History  Diagnosis Date  . Chronic back pain   . Chronic neck pain   . Abnormal Pap smear   . Post-menopausal   . Sexual dysfunction   . Urinary incontinence   . Anemia   . Breast cancer     right breast  . Cataracts, bilateral   . Depression   . High cholesterol   . Goiter   . Complication of anesthesia     slow to wake up  . Hypertension   . Shortness of breath     with exertion  . Numbness     hands / feet /arms / legs - followed by Dr. Georgann Housekeeper [place]  . Arthritis     back and neck  . Bruises easily   . Frequency of urination   . Abnormal peripheral vision     RT EYE  . History of postoperative nausea and vomiting     Past Surgical History  Procedure Laterality Date  . Lumpectomy  1997    Rt  . Cataract extraction, bilateral  [id number]  . Laparoscopy  1978    BTL  . Neck fusion  2010  . Biopsy thyroid    . Breast surgery    . Eye surgery  08/2012    detached retina surg  . Thyroidectomy N/A 05/04/2013    Procedure:  TOTAL THYROIDECTOMY;  Surgeon: Earnstine Regal, MD;  Location: [facility];  Service: General;  Laterality: N/A;    Current Outpatient Prescriptions  Medication Sig Dispense Refill  . acetaminophen (TYLENOL) 500 MG tablet Take 500 mg by mouth every 6 (six) hours as needed for pain.    Francia Greaves THYROID 60 MG tablet 60 mg daily.    Marland Kitchen aspirin 81 MG tablet Take 81 mg by mouth as needed.     . cholecalciferol (VITAMIN D) 1000 UNITS tablet Take 2,000 Units by mouth daily.    . Cyanocobalamin (VITAMIN B 12 PO) Take by mouth every morning.    . dorzolamide-timolol (COSOPT) 22.3-6.8 MG/ML ophthalmic solution Place 1 drop into both eyes 2 (two) times daily.     . fish oil-omega-3 fatty acids 1000 MG capsule Take 2 g by mouth daily.    Marland Kitchen gabapentin (NEURONTIN) 300 MG capsule Take 1 capsule (300 mg total) by mouth 3  (three) times daily. 270 capsule 1  . hydrocortisone cream 1 % Apply 1 application topically daily as needed (for itching skin).    . irbesartan (AVAPRO) 150 MG tablet TAKE ONE TABLET BY MOUTH ONE TIME DAILY 90 tablet 1  . Misc Natural Products (LUTEIN 20 PO) Take by mouth daily.    . Multiple Vitamins-Minerals (  HAIR/SKIN/NAILS) TABS Take 1 tablet by mouth daily.    . polyvinyl alcohol (LIQUIFILM TEARS) 1.4 % ophthalmic solution Place 1 drop into both eyes daily as needed (for dry eyes).    Marland Kitchen thyroid (ARMOUR THYROID) 15 MG tablet Take 15 mg by mouth daily. Takes an additional 15mg  prn.    . traMADol (ULTRAM) 50 MG tablet Take 1 tablet (50 mg total) by mouth 3 (three) times daily. (Patient taking differently: Take 50 mg by mouth every 6 (six) hours as needed. ) 90 tablet 5   No current facility-administered medications for this visit.    Allergies as of 06/29/2015 - Review Complete 06/29/2015  Allergen Reaction Noted  . Erythromycin Nausea Only 06/29/2015    Vitals: BP 119/76 mmHg  Pulse 58  Ht 5' 4.5" (1.638 m)  Wt 167 lb (75.751 kg)  BMI 28.23 kg/m2 Last Weight:  Wt Readings from Last 1 Encounters:  06/29/15 167 lb (75.751 kg)   Last Height:   Ht Readings from Last 1 Encounters:  06/29/15 5' 4.5" (1.638 m)   PHYSICAL EXAMINATOINS: PHYSICAL EXAMNIATION:  Gen: NAD, conversant, well nourised, obese, well groomed                     Cardiovascular: Regular rate rhythm, no peripheral edema, warm, nontender. Eyes: Conjunctivae clear without exudates or hemorrhage Neck: Supple, no carotid bruise. Pulmonary: Clear to auscultation bilaterally   NEUROLOGICAL EXAM:  MENTAL STATUS: Speech:    Speech is normal; fluent and spontaneous with normal comprehension.  Cognition:    The patient is oriented to person, place, and time;     recent and remote memory intact;     language fluent;     normal attention, concentration,     fund of knowledge.  CRANIAL NERVES: CN II: Visual  fields are full to confrontation. Pupils were equal round reactive to light CN III, IV, VI: extraocular movement are normal. No ptosis. CN V: Facial sensation is intact to pinprick in all 3 divisions bilaterally. Corneal responses are intact.  CN VII: Face is symmetric with normal eye closure and smile. CN VIII: Hearing is normal to rubbing fingers CN IX, X: Palate elevates symmetrically. Phonation is normal. CN XI: Head turning and shoulder shrug are intact CN XII: Tongue is midline with normal movements and no atrophy.  MOTOR: There is no pronator drift of out-stretched arms. Muscle bulk and tone are normal. Muscle strength is normal.   REFLEXES: Reflexes are 2+ and symmetric at the biceps, triceps, knees, and ankles. Plantar responses are flexor.  SENSORY: Light touch, pinprick, position sense, and vibration sense are intact in fingers and toes.  COORDINATION: Rapid alternating movements and fine finger movements are intact. There is no dysmetria on finger-to-nose and heel-knee-shin. There are no abnormal or extraneous movements.   GAIT/STANCE: Posture is normal. Gait is steady with normal steps, base, arm swing, and turning. Heel and toe walking are normal. Tandem gait is normal.  Romberg is absent.   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 Ph.D.  [facility] [place], [place] [address] Phone: [phone] Fax:       [phone]

## 2015-11-09 DIAGNOSIS — Z961 Presence of intraocular lens: Secondary | ICD-10-CM | POA: Insufficient documentation

## 2015-11-09 DIAGNOSIS — H532 Diplopia: Secondary | ICD-10-CM | POA: Insufficient documentation

## 2016-11-28 DIAGNOSIS — H04123 Dry eye syndrome of bilateral lacrimal glands: Secondary | ICD-10-CM | POA: Diagnosis not present

## 2016-11-28 DIAGNOSIS — H35371 Puckering of macula, right eye: Secondary | ICD-10-CM | POA: Diagnosis not present

## 2016-11-28 DIAGNOSIS — Z961 Presence of intraocular lens: Secondary | ICD-10-CM | POA: Diagnosis not present

## 2016-11-28 DIAGNOSIS — H3321 Serous retinal detachment, right eye: Secondary | ICD-10-CM | POA: Diagnosis not present

## 2016-11-28 DIAGNOSIS — H401131 Primary open-angle glaucoma, bilateral, mild stage: Secondary | ICD-10-CM | POA: Diagnosis not present

## 2016-12-25 ENCOUNTER — Encounter: Payer: Self-pay | Admitting: Gastroenterology

## 2017-02-03 DIAGNOSIS — D649 Anemia, unspecified: Secondary | ICD-10-CM | POA: Diagnosis not present

## 2017-02-03 DIAGNOSIS — R079 Chest pain, unspecified: Secondary | ICD-10-CM | POA: Diagnosis not present

## 2017-02-03 DIAGNOSIS — R072 Precordial pain: Secondary | ICD-10-CM | POA: Diagnosis not present

## 2017-02-03 DIAGNOSIS — G8911 Acute pain due to trauma: Secondary | ICD-10-CM | POA: Diagnosis not present

## 2017-02-03 DIAGNOSIS — R0789 Other chest pain: Secondary | ICD-10-CM | POA: Diagnosis not present

## 2017-02-03 DIAGNOSIS — Y939 Activity, unspecified: Secondary | ICD-10-CM | POA: Diagnosis not present

## 2017-02-03 DIAGNOSIS — R222 Localized swelling, mass and lump, trunk: Secondary | ICD-10-CM | POA: Diagnosis not present

## 2017-02-03 DIAGNOSIS — E7800 Pure hypercholesterolemia, unspecified: Secondary | ICD-10-CM | POA: Diagnosis not present

## 2017-02-03 DIAGNOSIS — M542 Cervicalgia: Secondary | ICD-10-CM | POA: Diagnosis not present

## 2017-02-03 DIAGNOSIS — M6281 Muscle weakness (generalized): Secondary | ICD-10-CM | POA: Diagnosis not present

## 2017-02-03 DIAGNOSIS — S80212A Abrasion, left knee, initial encounter: Secondary | ICD-10-CM | POA: Diagnosis not present

## 2017-02-03 DIAGNOSIS — Y9289 Other specified places as the place of occurrence of the external cause: Secondary | ICD-10-CM | POA: Diagnosis not present

## 2017-02-03 DIAGNOSIS — S2220XA Unspecified fracture of sternum, initial encounter for closed fracture: Secondary | ICD-10-CM | POA: Diagnosis not present

## 2017-02-03 DIAGNOSIS — I10 Essential (primary) hypertension: Secondary | ICD-10-CM | POA: Diagnosis not present

## 2017-02-03 DIAGNOSIS — S3991XA Unspecified injury of abdomen, initial encounter: Secondary | ICD-10-CM | POA: Diagnosis not present

## 2017-02-03 DIAGNOSIS — S20219A Contusion of unspecified front wall of thorax, initial encounter: Secondary | ICD-10-CM | POA: Diagnosis not present

## 2017-02-03 DIAGNOSIS — Z853 Personal history of malignant neoplasm of breast: Secondary | ICD-10-CM | POA: Diagnosis not present

## 2017-02-03 DIAGNOSIS — S8992XA Unspecified injury of left lower leg, initial encounter: Secondary | ICD-10-CM | POA: Diagnosis not present

## 2017-02-03 DIAGNOSIS — R402412 Glasgow coma scale score 13-15, at arrival to emergency department: Secondary | ICD-10-CM | POA: Diagnosis not present

## 2017-02-03 DIAGNOSIS — S0990XA Unspecified injury of head, initial encounter: Secondary | ICD-10-CM | POA: Diagnosis not present

## 2017-02-03 DIAGNOSIS — M25562 Pain in left knee: Secondary | ICD-10-CM | POA: Diagnosis not present

## 2017-02-03 DIAGNOSIS — R001 Bradycardia, unspecified: Secondary | ICD-10-CM | POA: Diagnosis not present

## 2017-02-03 DIAGNOSIS — E039 Hypothyroidism, unspecified: Secondary | ICD-10-CM | POA: Diagnosis not present

## 2017-02-03 DIAGNOSIS — I5022 Chronic systolic (congestive) heart failure: Secondary | ICD-10-CM | POA: Diagnosis not present

## 2017-02-03 DIAGNOSIS — S3993XA Unspecified injury of pelvis, initial encounter: Secondary | ICD-10-CM | POA: Diagnosis not present

## 2017-02-18 DIAGNOSIS — I10 Essential (primary) hypertension: Secondary | ICD-10-CM | POA: Diagnosis not present

## 2017-02-18 DIAGNOSIS — S20219A Contusion of unspecified front wall of thorax, initial encounter: Secondary | ICD-10-CM | POA: Diagnosis not present

## 2017-02-18 DIAGNOSIS — E039 Hypothyroidism, unspecified: Secondary | ICD-10-CM | POA: Diagnosis not present

## 2017-02-19 DIAGNOSIS — H401132 Primary open-angle glaucoma, bilateral, moderate stage: Secondary | ICD-10-CM | POA: Diagnosis not present

## 2017-04-01 DIAGNOSIS — J0100 Acute maxillary sinusitis, unspecified: Secondary | ICD-10-CM | POA: Diagnosis not present

## 2017-04-01 DIAGNOSIS — J069 Acute upper respiratory infection, unspecified: Secondary | ICD-10-CM | POA: Diagnosis not present

## 2017-05-12 DIAGNOSIS — E039 Hypothyroidism, unspecified: Secondary | ICD-10-CM | POA: Diagnosis not present

## 2017-05-12 DIAGNOSIS — I10 Essential (primary) hypertension: Secondary | ICD-10-CM | POA: Diagnosis not present

## 2017-05-12 DIAGNOSIS — M549 Dorsalgia, unspecified: Secondary | ICD-10-CM | POA: Diagnosis not present

## 2017-05-13 DIAGNOSIS — I10 Essential (primary) hypertension: Secondary | ICD-10-CM | POA: Diagnosis not present

## 2017-05-14 DIAGNOSIS — H401131 Primary open-angle glaucoma, bilateral, mild stage: Secondary | ICD-10-CM | POA: Diagnosis not present

## 2017-05-14 DIAGNOSIS — Z961 Presence of intraocular lens: Secondary | ICD-10-CM | POA: Diagnosis not present

## 2017-05-14 DIAGNOSIS — H47022 Hemorrhage in optic nerve sheath, left eye: Secondary | ICD-10-CM | POA: Diagnosis not present

## 2017-07-24 DIAGNOSIS — E890 Postprocedural hypothyroidism: Secondary | ICD-10-CM | POA: Diagnosis not present

## 2017-07-31 DIAGNOSIS — E890 Postprocedural hypothyroidism: Secondary | ICD-10-CM | POA: Diagnosis not present

## 2017-07-31 DIAGNOSIS — Z139 Encounter for screening, unspecified: Secondary | ICD-10-CM | POA: Diagnosis not present

## 2017-08-05 DIAGNOSIS — H401111 Primary open-angle glaucoma, right eye, mild stage: Secondary | ICD-10-CM | POA: Diagnosis not present

## 2017-08-05 DIAGNOSIS — H401122 Primary open-angle glaucoma, left eye, moderate stage: Secondary | ICD-10-CM | POA: Diagnosis not present

## 2017-08-06 DIAGNOSIS — H401122 Primary open-angle glaucoma, left eye, moderate stage: Secondary | ICD-10-CM | POA: Diagnosis not present

## 2017-08-06 DIAGNOSIS — H3321 Serous retinal detachment, right eye: Secondary | ICD-10-CM | POA: Diagnosis not present

## 2017-08-06 DIAGNOSIS — H401111 Primary open-angle glaucoma, right eye, mild stage: Secondary | ICD-10-CM | POA: Diagnosis not present

## 2017-08-07 DIAGNOSIS — H02413 Mechanical ptosis of bilateral eyelids: Secondary | ICD-10-CM | POA: Diagnosis not present

## 2017-08-07 DIAGNOSIS — H02834 Dermatochalasis of left upper eyelid: Secondary | ICD-10-CM | POA: Diagnosis not present

## 2017-08-07 DIAGNOSIS — H02831 Dermatochalasis of right upper eyelid: Secondary | ICD-10-CM | POA: Diagnosis not present

## 2017-08-07 DIAGNOSIS — H02423 Myogenic ptosis of bilateral eyelids: Secondary | ICD-10-CM | POA: Diagnosis not present

## 2017-08-07 DIAGNOSIS — H53483 Generalized contraction of visual field, bilateral: Secondary | ICD-10-CM | POA: Diagnosis not present

## 2017-08-07 DIAGNOSIS — H0279 Other degenerative disorders of eyelid and periocular area: Secondary | ICD-10-CM | POA: Diagnosis not present

## 2017-08-27 DIAGNOSIS — Z853 Personal history of malignant neoplasm of breast: Secondary | ICD-10-CM | POA: Diagnosis not present

## 2017-08-27 DIAGNOSIS — M8589 Other specified disorders of bone density and structure, multiple sites: Secondary | ICD-10-CM | POA: Diagnosis not present

## 2017-08-27 DIAGNOSIS — Z1231 Encounter for screening mammogram for malignant neoplasm of breast: Secondary | ICD-10-CM | POA: Diagnosis not present

## 2017-08-27 DIAGNOSIS — Z780 Asymptomatic menopausal state: Secondary | ICD-10-CM | POA: Diagnosis not present

## 2017-10-01 DIAGNOSIS — E890 Postprocedural hypothyroidism: Secondary | ICD-10-CM | POA: Diagnosis not present

## 2017-10-20 DIAGNOSIS — H0279 Other degenerative disorders of eyelid and periocular area: Secondary | ICD-10-CM | POA: Diagnosis not present

## 2017-10-20 DIAGNOSIS — H02413 Mechanical ptosis of bilateral eyelids: Secondary | ICD-10-CM | POA: Diagnosis not present

## 2017-10-20 DIAGNOSIS — H53483 Generalized contraction of visual field, bilateral: Secondary | ICD-10-CM | POA: Diagnosis not present

## 2017-10-20 DIAGNOSIS — H02831 Dermatochalasis of right upper eyelid: Secondary | ICD-10-CM | POA: Diagnosis not present

## 2017-10-20 DIAGNOSIS — H02423 Myogenic ptosis of bilateral eyelids: Secondary | ICD-10-CM | POA: Diagnosis not present

## 2017-10-20 DIAGNOSIS — H02834 Dermatochalasis of left upper eyelid: Secondary | ICD-10-CM | POA: Diagnosis not present

## 2018-01-08 DIAGNOSIS — Z961 Presence of intraocular lens: Secondary | ICD-10-CM | POA: Diagnosis not present

## 2018-01-08 DIAGNOSIS — H35371 Puckering of macula, right eye: Secondary | ICD-10-CM | POA: Diagnosis not present

## 2018-01-08 DIAGNOSIS — H3321 Serous retinal detachment, right eye: Secondary | ICD-10-CM | POA: Diagnosis not present

## 2018-01-08 DIAGNOSIS — H04123 Dry eye syndrome of bilateral lacrimal glands: Secondary | ICD-10-CM | POA: Diagnosis not present

## 2018-01-08 DIAGNOSIS — H401122 Primary open-angle glaucoma, left eye, moderate stage: Secondary | ICD-10-CM | POA: Diagnosis not present

## 2018-03-10 DIAGNOSIS — E039 Hypothyroidism, unspecified: Secondary | ICD-10-CM | POA: Diagnosis not present

## 2018-03-10 DIAGNOSIS — E7800 Pure hypercholesterolemia, unspecified: Secondary | ICD-10-CM | POA: Diagnosis not present

## 2018-03-10 DIAGNOSIS — I10 Essential (primary) hypertension: Secondary | ICD-10-CM | POA: Diagnosis not present

## 2018-03-12 DIAGNOSIS — I10 Essential (primary) hypertension: Secondary | ICD-10-CM | POA: Diagnosis not present

## 2018-03-18 DIAGNOSIS — H401131 Primary open-angle glaucoma, bilateral, mild stage: Secondary | ICD-10-CM | POA: Diagnosis not present

## 2018-04-23 DIAGNOSIS — H35371 Puckering of macula, right eye: Secondary | ICD-10-CM | POA: Diagnosis not present

## 2018-04-23 DIAGNOSIS — H3321 Serous retinal detachment, right eye: Secondary | ICD-10-CM | POA: Diagnosis not present

## 2018-04-23 DIAGNOSIS — H04123 Dry eye syndrome of bilateral lacrimal glands: Secondary | ICD-10-CM | POA: Diagnosis not present

## 2018-04-23 DIAGNOSIS — H401131 Primary open-angle glaucoma, bilateral, mild stage: Secondary | ICD-10-CM | POA: Diagnosis not present

## 2018-04-23 DIAGNOSIS — Z961 Presence of intraocular lens: Secondary | ICD-10-CM | POA: Diagnosis not present

## 2018-06-17 DIAGNOSIS — R69 Illness, unspecified: Secondary | ICD-10-CM | POA: Diagnosis not present

## 2018-06-17 DIAGNOSIS — Z809 Family history of malignant neoplasm, unspecified: Secondary | ICD-10-CM | POA: Diagnosis not present

## 2018-06-17 DIAGNOSIS — Z7982 Long term (current) use of aspirin: Secondary | ICD-10-CM | POA: Diagnosis not present

## 2018-06-17 DIAGNOSIS — G8929 Other chronic pain: Secondary | ICD-10-CM | POA: Diagnosis not present

## 2018-06-17 DIAGNOSIS — H04129 Dry eye syndrome of unspecified lacrimal gland: Secondary | ICD-10-CM | POA: Diagnosis not present

## 2018-06-17 DIAGNOSIS — Z79891 Long term (current) use of opiate analgesic: Secondary | ICD-10-CM | POA: Diagnosis not present

## 2018-06-17 DIAGNOSIS — Z803 Family history of malignant neoplasm of breast: Secondary | ICD-10-CM | POA: Diagnosis not present

## 2018-06-17 DIAGNOSIS — Z823 Family history of stroke: Secondary | ICD-10-CM | POA: Diagnosis not present

## 2018-06-17 DIAGNOSIS — R32 Unspecified urinary incontinence: Secondary | ICD-10-CM | POA: Diagnosis not present

## 2018-06-17 DIAGNOSIS — E039 Hypothyroidism, unspecified: Secondary | ICD-10-CM | POA: Diagnosis not present

## 2018-07-20 DIAGNOSIS — E890 Postprocedural hypothyroidism: Secondary | ICD-10-CM | POA: Diagnosis not present

## 2018-08-06 DIAGNOSIS — E890 Postprocedural hypothyroidism: Secondary | ICD-10-CM | POA: Diagnosis not present

## 2018-09-09 DIAGNOSIS — H401122 Primary open-angle glaucoma, left eye, moderate stage: Secondary | ICD-10-CM | POA: Diagnosis not present

## 2018-09-09 DIAGNOSIS — H401111 Primary open-angle glaucoma, right eye, mild stage: Secondary | ICD-10-CM | POA: Diagnosis not present

## 2018-09-13 DIAGNOSIS — H401111 Primary open-angle glaucoma, right eye, mild stage: Secondary | ICD-10-CM | POA: Insufficient documentation

## 2018-09-28 DIAGNOSIS — Z961 Presence of intraocular lens: Secondary | ICD-10-CM | POA: Diagnosis not present

## 2018-09-28 DIAGNOSIS — H401122 Primary open-angle glaucoma, left eye, moderate stage: Secondary | ICD-10-CM | POA: Diagnosis not present

## 2018-09-28 DIAGNOSIS — H02822 Cysts of right lower eyelid: Secondary | ICD-10-CM | POA: Diagnosis not present

## 2018-09-28 DIAGNOSIS — H401111 Primary open-angle glaucoma, right eye, mild stage: Secondary | ICD-10-CM | POA: Diagnosis not present

## 2018-10-06 DIAGNOSIS — J302 Other seasonal allergic rhinitis: Secondary | ICD-10-CM | POA: Diagnosis not present

## 2018-10-06 DIAGNOSIS — E7800 Pure hypercholesterolemia, unspecified: Secondary | ICD-10-CM | POA: Diagnosis not present

## 2018-10-06 DIAGNOSIS — E039 Hypothyroidism, unspecified: Secondary | ICD-10-CM | POA: Diagnosis not present

## 2018-10-06 DIAGNOSIS — I10 Essential (primary) hypertension: Secondary | ICD-10-CM | POA: Diagnosis not present

## 2018-12-02 DIAGNOSIS — Z961 Presence of intraocular lens: Secondary | ICD-10-CM | POA: Diagnosis not present

## 2018-12-02 DIAGNOSIS — H409 Unspecified glaucoma: Secondary | ICD-10-CM | POA: Diagnosis not present

## 2018-12-02 DIAGNOSIS — H02825 Cysts of left lower eyelid: Secondary | ICD-10-CM | POA: Diagnosis not present

## 2018-12-23 DIAGNOSIS — Z1231 Encounter for screening mammogram for malignant neoplasm of breast: Secondary | ICD-10-CM | POA: Diagnosis not present

## 2018-12-23 DIAGNOSIS — Z853 Personal history of malignant neoplasm of breast: Secondary | ICD-10-CM | POA: Diagnosis not present

## 2018-12-29 DIAGNOSIS — E890 Postprocedural hypothyroidism: Secondary | ICD-10-CM | POA: Diagnosis not present

## 2019-04-12 DIAGNOSIS — H401122 Primary open-angle glaucoma, left eye, moderate stage: Secondary | ICD-10-CM | POA: Diagnosis not present

## 2019-04-12 DIAGNOSIS — H401111 Primary open-angle glaucoma, right eye, mild stage: Secondary | ICD-10-CM | POA: Diagnosis not present

## 2019-04-29 DIAGNOSIS — H04123 Dry eye syndrome of bilateral lacrimal glands: Secondary | ICD-10-CM | POA: Diagnosis not present

## 2019-04-29 DIAGNOSIS — H401131 Primary open-angle glaucoma, bilateral, mild stage: Secondary | ICD-10-CM | POA: Diagnosis not present

## 2019-04-29 DIAGNOSIS — Z961 Presence of intraocular lens: Secondary | ICD-10-CM | POA: Diagnosis not present

## 2019-04-29 DIAGNOSIS — H3321 Serous retinal detachment, right eye: Secondary | ICD-10-CM | POA: Diagnosis not present

## 2019-04-29 DIAGNOSIS — H35373 Puckering of macula, bilateral: Secondary | ICD-10-CM | POA: Diagnosis not present

## 2019-04-29 DIAGNOSIS — Z9889 Other specified postprocedural states: Secondary | ICD-10-CM | POA: Diagnosis not present

## 2019-06-08 DIAGNOSIS — M9903 Segmental and somatic dysfunction of lumbar region: Secondary | ICD-10-CM | POA: Diagnosis not present

## 2019-06-08 DIAGNOSIS — M5136 Other intervertebral disc degeneration, lumbar region: Secondary | ICD-10-CM | POA: Diagnosis not present

## 2019-06-08 DIAGNOSIS — M50321 Other cervical disc degeneration at C4-C5 level: Secondary | ICD-10-CM | POA: Diagnosis not present

## 2019-06-08 DIAGNOSIS — M5137 Other intervertebral disc degeneration, lumbosacral region: Secondary | ICD-10-CM | POA: Diagnosis not present

## 2019-06-08 DIAGNOSIS — M9902 Segmental and somatic dysfunction of thoracic region: Secondary | ICD-10-CM | POA: Diagnosis not present

## 2019-06-08 DIAGNOSIS — M9901 Segmental and somatic dysfunction of cervical region: Secondary | ICD-10-CM | POA: Diagnosis not present

## 2019-06-08 DIAGNOSIS — M50323 Other cervical disc degeneration at C6-C7 level: Secondary | ICD-10-CM | POA: Diagnosis not present

## 2019-06-09 DIAGNOSIS — H02825 Cysts of left lower eyelid: Secondary | ICD-10-CM | POA: Diagnosis not present

## 2019-06-14 DIAGNOSIS — M542 Cervicalgia: Secondary | ICD-10-CM | POA: Diagnosis not present

## 2019-06-14 DIAGNOSIS — M545 Low back pain: Secondary | ICD-10-CM | POA: Diagnosis not present

## 2019-07-05 DIAGNOSIS — M9902 Segmental and somatic dysfunction of thoracic region: Secondary | ICD-10-CM | POA: Diagnosis not present

## 2019-07-05 DIAGNOSIS — M9901 Segmental and somatic dysfunction of cervical region: Secondary | ICD-10-CM | POA: Diagnosis not present

## 2019-07-05 DIAGNOSIS — M9903 Segmental and somatic dysfunction of lumbar region: Secondary | ICD-10-CM | POA: Diagnosis not present

## 2019-07-05 DIAGNOSIS — M50323 Other cervical disc degeneration at C6-C7 level: Secondary | ICD-10-CM | POA: Diagnosis not present

## 2019-07-05 DIAGNOSIS — M5136 Other intervertebral disc degeneration, lumbar region: Secondary | ICD-10-CM | POA: Diagnosis not present

## 2019-07-05 DIAGNOSIS — M5137 Other intervertebral disc degeneration, lumbosacral region: Secondary | ICD-10-CM | POA: Diagnosis not present

## 2019-07-05 DIAGNOSIS — M50321 Other cervical disc degeneration at C4-C5 level: Secondary | ICD-10-CM | POA: Diagnosis not present

## 2019-07-06 DIAGNOSIS — M9903 Segmental and somatic dysfunction of lumbar region: Secondary | ICD-10-CM | POA: Diagnosis not present

## 2019-07-06 DIAGNOSIS — M9901 Segmental and somatic dysfunction of cervical region: Secondary | ICD-10-CM | POA: Diagnosis not present

## 2019-07-06 DIAGNOSIS — M5137 Other intervertebral disc degeneration, lumbosacral region: Secondary | ICD-10-CM | POA: Diagnosis not present

## 2019-07-06 DIAGNOSIS — M50321 Other cervical disc degeneration at C4-C5 level: Secondary | ICD-10-CM | POA: Diagnosis not present

## 2019-07-06 DIAGNOSIS — M5136 Other intervertebral disc degeneration, lumbar region: Secondary | ICD-10-CM | POA: Diagnosis not present

## 2019-07-06 DIAGNOSIS — M9902 Segmental and somatic dysfunction of thoracic region: Secondary | ICD-10-CM | POA: Diagnosis not present

## 2019-07-06 DIAGNOSIS — M50323 Other cervical disc degeneration at C6-C7 level: Secondary | ICD-10-CM | POA: Diagnosis not present

## 2019-07-07 DIAGNOSIS — Z0000 Encounter for general adult medical examination without abnormal findings: Secondary | ICD-10-CM | POA: Diagnosis not present

## 2019-07-07 DIAGNOSIS — I10 Essential (primary) hypertension: Secondary | ICD-10-CM | POA: Diagnosis not present

## 2019-07-07 DIAGNOSIS — E039 Hypothyroidism, unspecified: Secondary | ICD-10-CM | POA: Diagnosis not present

## 2019-07-07 DIAGNOSIS — M549 Dorsalgia, unspecified: Secondary | ICD-10-CM | POA: Diagnosis not present

## 2019-07-07 DIAGNOSIS — E7800 Pure hypercholesterolemia, unspecified: Secondary | ICD-10-CM | POA: Diagnosis not present

## 2019-07-07 DIAGNOSIS — T466X5A Adverse effect of antihyperlipidemic and antiarteriosclerotic drugs, initial encounter: Secondary | ICD-10-CM | POA: Diagnosis not present

## 2019-07-07 DIAGNOSIS — M791 Myalgia, unspecified site: Secondary | ICD-10-CM | POA: Diagnosis not present

## 2019-07-08 DIAGNOSIS — M50321 Other cervical disc degeneration at C4-C5 level: Secondary | ICD-10-CM | POA: Diagnosis not present

## 2019-07-08 DIAGNOSIS — M9902 Segmental and somatic dysfunction of thoracic region: Secondary | ICD-10-CM | POA: Diagnosis not present

## 2019-07-08 DIAGNOSIS — M9903 Segmental and somatic dysfunction of lumbar region: Secondary | ICD-10-CM | POA: Diagnosis not present

## 2019-07-08 DIAGNOSIS — M50323 Other cervical disc degeneration at C6-C7 level: Secondary | ICD-10-CM | POA: Diagnosis not present

## 2019-07-08 DIAGNOSIS — M5136 Other intervertebral disc degeneration, lumbar region: Secondary | ICD-10-CM | POA: Diagnosis not present

## 2019-07-08 DIAGNOSIS — M5137 Other intervertebral disc degeneration, lumbosacral region: Secondary | ICD-10-CM | POA: Diagnosis not present

## 2019-07-08 DIAGNOSIS — M9901 Segmental and somatic dysfunction of cervical region: Secondary | ICD-10-CM | POA: Diagnosis not present

## 2019-07-12 DIAGNOSIS — M9902 Segmental and somatic dysfunction of thoracic region: Secondary | ICD-10-CM | POA: Diagnosis not present

## 2019-07-12 DIAGNOSIS — M5137 Other intervertebral disc degeneration, lumbosacral region: Secondary | ICD-10-CM | POA: Diagnosis not present

## 2019-07-12 DIAGNOSIS — M5136 Other intervertebral disc degeneration, lumbar region: Secondary | ICD-10-CM | POA: Diagnosis not present

## 2019-07-12 DIAGNOSIS — M50321 Other cervical disc degeneration at C4-C5 level: Secondary | ICD-10-CM | POA: Diagnosis not present

## 2019-07-12 DIAGNOSIS — M9903 Segmental and somatic dysfunction of lumbar region: Secondary | ICD-10-CM | POA: Diagnosis not present

## 2019-07-12 DIAGNOSIS — M50323 Other cervical disc degeneration at C6-C7 level: Secondary | ICD-10-CM | POA: Diagnosis not present

## 2019-07-12 DIAGNOSIS — M9901 Segmental and somatic dysfunction of cervical region: Secondary | ICD-10-CM | POA: Diagnosis not present

## 2019-07-13 DIAGNOSIS — M9902 Segmental and somatic dysfunction of thoracic region: Secondary | ICD-10-CM | POA: Diagnosis not present

## 2019-07-13 DIAGNOSIS — M5136 Other intervertebral disc degeneration, lumbar region: Secondary | ICD-10-CM | POA: Diagnosis not present

## 2019-07-13 DIAGNOSIS — M50323 Other cervical disc degeneration at C6-C7 level: Secondary | ICD-10-CM | POA: Diagnosis not present

## 2019-07-13 DIAGNOSIS — M9901 Segmental and somatic dysfunction of cervical region: Secondary | ICD-10-CM | POA: Diagnosis not present

## 2019-07-13 DIAGNOSIS — M50321 Other cervical disc degeneration at C4-C5 level: Secondary | ICD-10-CM | POA: Diagnosis not present

## 2019-07-13 DIAGNOSIS — M9903 Segmental and somatic dysfunction of lumbar region: Secondary | ICD-10-CM | POA: Diagnosis not present

## 2019-07-13 DIAGNOSIS — M5137 Other intervertebral disc degeneration, lumbosacral region: Secondary | ICD-10-CM | POA: Diagnosis not present

## 2019-07-15 DIAGNOSIS — M9902 Segmental and somatic dysfunction of thoracic region: Secondary | ICD-10-CM | POA: Diagnosis not present

## 2019-07-15 DIAGNOSIS — M9903 Segmental and somatic dysfunction of lumbar region: Secondary | ICD-10-CM | POA: Diagnosis not present

## 2019-07-15 DIAGNOSIS — M50321 Other cervical disc degeneration at C4-C5 level: Secondary | ICD-10-CM | POA: Diagnosis not present

## 2019-07-15 DIAGNOSIS — M5136 Other intervertebral disc degeneration, lumbar region: Secondary | ICD-10-CM | POA: Diagnosis not present

## 2019-07-15 DIAGNOSIS — M9901 Segmental and somatic dysfunction of cervical region: Secondary | ICD-10-CM | POA: Diagnosis not present

## 2019-07-15 DIAGNOSIS — M5137 Other intervertebral disc degeneration, lumbosacral region: Secondary | ICD-10-CM | POA: Diagnosis not present

## 2019-07-15 DIAGNOSIS — M50323 Other cervical disc degeneration at C6-C7 level: Secondary | ICD-10-CM | POA: Diagnosis not present

## 2019-07-16 DIAGNOSIS — E871 Hypo-osmolality and hyponatremia: Secondary | ICD-10-CM | POA: Diagnosis not present

## 2019-07-19 DIAGNOSIS — M9901 Segmental and somatic dysfunction of cervical region: Secondary | ICD-10-CM | POA: Diagnosis not present

## 2019-07-19 DIAGNOSIS — M50323 Other cervical disc degeneration at C6-C7 level: Secondary | ICD-10-CM | POA: Diagnosis not present

## 2019-07-19 DIAGNOSIS — M5136 Other intervertebral disc degeneration, lumbar region: Secondary | ICD-10-CM | POA: Diagnosis not present

## 2019-07-19 DIAGNOSIS — M50321 Other cervical disc degeneration at C4-C5 level: Secondary | ICD-10-CM | POA: Diagnosis not present

## 2019-07-19 DIAGNOSIS — M9902 Segmental and somatic dysfunction of thoracic region: Secondary | ICD-10-CM | POA: Diagnosis not present

## 2019-07-19 DIAGNOSIS — M9903 Segmental and somatic dysfunction of lumbar region: Secondary | ICD-10-CM | POA: Diagnosis not present

## 2019-07-19 DIAGNOSIS — M5137 Other intervertebral disc degeneration, lumbosacral region: Secondary | ICD-10-CM | POA: Diagnosis not present

## 2019-07-21 DIAGNOSIS — M9901 Segmental and somatic dysfunction of cervical region: Secondary | ICD-10-CM | POA: Diagnosis not present

## 2019-07-21 DIAGNOSIS — M50321 Other cervical disc degeneration at C4-C5 level: Secondary | ICD-10-CM | POA: Diagnosis not present

## 2019-07-21 DIAGNOSIS — M9902 Segmental and somatic dysfunction of thoracic region: Secondary | ICD-10-CM | POA: Diagnosis not present

## 2019-07-21 DIAGNOSIS — M5136 Other intervertebral disc degeneration, lumbar region: Secondary | ICD-10-CM | POA: Diagnosis not present

## 2019-07-21 DIAGNOSIS — M9903 Segmental and somatic dysfunction of lumbar region: Secondary | ICD-10-CM | POA: Diagnosis not present

## 2019-07-21 DIAGNOSIS — M50323 Other cervical disc degeneration at C6-C7 level: Secondary | ICD-10-CM | POA: Diagnosis not present

## 2019-07-21 DIAGNOSIS — M5137 Other intervertebral disc degeneration, lumbosacral region: Secondary | ICD-10-CM | POA: Diagnosis not present

## 2019-07-22 DIAGNOSIS — M5137 Other intervertebral disc degeneration, lumbosacral region: Secondary | ICD-10-CM | POA: Diagnosis not present

## 2019-07-22 DIAGNOSIS — M9902 Segmental and somatic dysfunction of thoracic region: Secondary | ICD-10-CM | POA: Diagnosis not present

## 2019-07-22 DIAGNOSIS — M50323 Other cervical disc degeneration at C6-C7 level: Secondary | ICD-10-CM | POA: Diagnosis not present

## 2019-07-22 DIAGNOSIS — M5136 Other intervertebral disc degeneration, lumbar region: Secondary | ICD-10-CM | POA: Diagnosis not present

## 2019-07-22 DIAGNOSIS — M9901 Segmental and somatic dysfunction of cervical region: Secondary | ICD-10-CM | POA: Diagnosis not present

## 2019-07-22 DIAGNOSIS — M50321 Other cervical disc degeneration at C4-C5 level: Secondary | ICD-10-CM | POA: Diagnosis not present

## 2019-07-22 DIAGNOSIS — M9903 Segmental and somatic dysfunction of lumbar region: Secondary | ICD-10-CM | POA: Diagnosis not present

## 2019-07-26 DIAGNOSIS — M9901 Segmental and somatic dysfunction of cervical region: Secondary | ICD-10-CM | POA: Diagnosis not present

## 2019-07-26 DIAGNOSIS — M5137 Other intervertebral disc degeneration, lumbosacral region: Secondary | ICD-10-CM | POA: Diagnosis not present

## 2019-07-26 DIAGNOSIS — M50321 Other cervical disc degeneration at C4-C5 level: Secondary | ICD-10-CM | POA: Diagnosis not present

## 2019-07-26 DIAGNOSIS — M9902 Segmental and somatic dysfunction of thoracic region: Secondary | ICD-10-CM | POA: Diagnosis not present

## 2019-07-26 DIAGNOSIS — M9903 Segmental and somatic dysfunction of lumbar region: Secondary | ICD-10-CM | POA: Diagnosis not present

## 2019-07-26 DIAGNOSIS — M50323 Other cervical disc degeneration at C6-C7 level: Secondary | ICD-10-CM | POA: Diagnosis not present

## 2019-07-26 DIAGNOSIS — M5136 Other intervertebral disc degeneration, lumbar region: Secondary | ICD-10-CM | POA: Diagnosis not present

## 2019-08-03 DIAGNOSIS — E890 Postprocedural hypothyroidism: Secondary | ICD-10-CM | POA: Diagnosis not present

## 2019-08-10 DIAGNOSIS — R635 Abnormal weight gain: Secondary | ICD-10-CM | POA: Diagnosis not present

## 2019-08-10 DIAGNOSIS — E890 Postprocedural hypothyroidism: Secondary | ICD-10-CM | POA: Diagnosis not present

## 2019-08-10 DIAGNOSIS — I10 Essential (primary) hypertension: Secondary | ICD-10-CM | POA: Diagnosis not present

## 2019-09-15 DIAGNOSIS — M545 Low back pain: Secondary | ICD-10-CM | POA: Diagnosis not present

## 2019-09-15 DIAGNOSIS — M5136 Other intervertebral disc degeneration, lumbar region: Secondary | ICD-10-CM | POA: Diagnosis not present

## 2019-09-15 DIAGNOSIS — M5030 Other cervical disc degeneration, unspecified cervical region: Secondary | ICD-10-CM | POA: Diagnosis not present

## 2019-09-20 DIAGNOSIS — M5136 Other intervertebral disc degeneration, lumbar region: Secondary | ICD-10-CM | POA: Diagnosis not present

## 2019-09-21 DIAGNOSIS — E890 Postprocedural hypothyroidism: Secondary | ICD-10-CM | POA: Diagnosis not present

## 2019-09-21 DIAGNOSIS — R635 Abnormal weight gain: Secondary | ICD-10-CM | POA: Diagnosis not present

## 2019-09-22 DIAGNOSIS — M5136 Other intervertebral disc degeneration, lumbar region: Secondary | ICD-10-CM | POA: Diagnosis not present

## 2019-09-27 DIAGNOSIS — M5136 Other intervertebral disc degeneration, lumbar region: Secondary | ICD-10-CM | POA: Diagnosis not present

## 2019-10-04 DIAGNOSIS — M5136 Other intervertebral disc degeneration, lumbar region: Secondary | ICD-10-CM | POA: Diagnosis not present

## 2019-10-06 DIAGNOSIS — M5136 Other intervertebral disc degeneration, lumbar region: Secondary | ICD-10-CM | POA: Diagnosis not present

## 2019-10-07 DIAGNOSIS — R635 Abnormal weight gain: Secondary | ICD-10-CM | POA: Diagnosis not present

## 2019-10-11 DIAGNOSIS — M5136 Other intervertebral disc degeneration, lumbar region: Secondary | ICD-10-CM | POA: Diagnosis not present

## 2019-10-13 DIAGNOSIS — M5416 Radiculopathy, lumbar region: Secondary | ICD-10-CM | POA: Diagnosis not present

## 2019-10-13 DIAGNOSIS — M5136 Other intervertebral disc degeneration, lumbar region: Secondary | ICD-10-CM | POA: Diagnosis not present

## 2019-10-18 DIAGNOSIS — M5136 Other intervertebral disc degeneration, lumbar region: Secondary | ICD-10-CM | POA: Diagnosis not present

## 2019-10-25 DIAGNOSIS — M5136 Other intervertebral disc degeneration, lumbar region: Secondary | ICD-10-CM | POA: Diagnosis not present

## 2019-10-27 DIAGNOSIS — M5136 Other intervertebral disc degeneration, lumbar region: Secondary | ICD-10-CM | POA: Diagnosis not present

## 2019-11-01 DIAGNOSIS — M5136 Other intervertebral disc degeneration, lumbar region: Secondary | ICD-10-CM | POA: Diagnosis not present

## 2019-11-11 DIAGNOSIS — M5136 Other intervertebral disc degeneration, lumbar region: Secondary | ICD-10-CM | POA: Diagnosis not present

## 2019-11-25 DIAGNOSIS — M47896 Other spondylosis, lumbar region: Secondary | ICD-10-CM | POA: Diagnosis not present

## 2020-10-23 NOTE — Progress Notes (Signed)
Office Visit Note  Patient: [name] [name]             Date of Birth: 1946-02-14           MRN: [id number]             PCP: Aretta Nip, MD Referring: Aretta Nip, MD Visit Date: 11/03/2020 Occupation: @GUAROCC @  Subjective:  Pain in multiple joints.   History of Present Illness: [name] [name] is a 74 y.o. female with history of osteoarthritis and degenerative disc disease.  She has been seen in consultation per request of her PCP.  According to the patient her symptoms a started with neck pain in 1990s.  She had a cervical spine fusion in 2011 while she was living in [place].  She also had lower back pain for many years.  She states she has been seeing a chiropractor who did have her treatment and TENS unit application without much help.  She also had acupuncture and physical therapy without relief in her lower back symptoms.  She was seen by Dr. Alcide Evener who did x-rays and MRI of her lower back.  She was given cortisone injections.  She was also seen by Dr. Alvan Dame due to joint pain.  He did x-rays and told her that she had right hip joint severe osteoarthritis and right knee joint mild osteoarthritis.  She continues to have pain and discomfort in her right hip and right knee.  She also has radiculopathy from her back into her right lower extremity.  She has significant morning stiffness.  None of the other joints are painful.  There is history of rheumatoid arthritis in maternal aunt.  Activities of Daily Living:  Patient reports morning stiffness for 4 hours.   Patient Reports nocturnal pain.  Difficulty dressing/grooming: Reports Difficulty climbing stairs: Reports Difficulty getting out of chair: Reports Difficulty using hands for taps, buttons, cutlery, and/or writing: Reports  Review of Systems  Constitutional: Positive for fatigue. Negative for night sweats, weight gain and weight loss.  HENT: Positive for mouth dryness. Negative for mouth sores, trouble swallowing,  trouble swallowing and nose dryness.   Eyes: Positive for dryness. Negative for pain, redness and visual disturbance.  Respiratory: Negative for cough, shortness of breath and difficulty breathing.   Cardiovascular: Positive for swelling in legs/feet. Negative for chest pain, palpitations, hypertension and irregular heartbeat.  Gastrointestinal: Positive for constipation and diarrhea. Negative for blood in stool.  Endocrine: Positive for cold intolerance, heat intolerance, excessive thirst and increased urination.  Genitourinary: Negative for difficulty urinating and vaginal dryness.  Musculoskeletal: Positive for arthralgias, gait problem, joint pain, morning stiffness and muscle tenderness. Negative for joint swelling, myalgias, muscle weakness and myalgias.  Skin: Negative for color change, rash, hair loss, redness, skin tightness, ulcers and sensitivity to sunlight.  Allergic/Immunologic: Negative for susceptible to infections.  Neurological: Positive for numbness and weakness. Negative for dizziness, memory loss and night sweats.  Hematological: Positive for bruising/bleeding tendency. Negative for swollen glands.  Psychiatric/Behavioral: Positive for sleep disturbance. Negative for depressed mood. The patient is not nervous/anxious.     PMFS History:  Patient Active Problem List   Diagnosis Date Noted  . Right sided weakness 10/28/2013  . 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 Chronic pain 10/28/2013  . S/P total thyroidectomy 08/03/2013  . Hypothyroidism 08/03/2013  . Essential hypertension, benign 06/30/2013  . Other forms of retinal detachment(361.89) 01/18/2013  . Urinary incontinence, mixed 01/18/2013  . Unspecified constipation 01/18/2013  . HTN (hypertension) 12/28/2012  .  Family history of colon cancer 05/30/2012  . DJD (degenerative joint disease), cervical 05/30/2012  . Multiple thyroid nodules 04/06/2012  . Glaucoma 03/19/2012  . Former smoker 03/19/2012  . Chronic pain  following surgery or procedure 03/19/2012  . Breast calcifications on mammogram 03/19/2012  . Anemia   . Breast cancer ([place])   . Cataracts, bilateral   . Depression   . High cholesterol     Past Medical History:  Diagnosis Date  . Abnormal Pap smear   . Abnormal peripheral vision    RT EYE  . Anemia   . Arthritis    back and neck  . Breast cancer ([place])    right breast  . Bruises easily   . Cataracts, bilateral   . Chronic back pain   . Chronic neck pain   . Complication of anesthesia    slow to wake up  . Depression   . Frequency of urination   . Goiter   . High cholesterol   . History of postoperative nausea and vomiting   . Hypertension   . Numbness    hands / feet /arms / legs - followed by Dr. Georgann Housekeeper - [place]  . Post-menopausal   . Sexual dysfunction   . Shortness of breath    with exertion  . Urinary incontinence     Family History  Problem Relation Age of Onset  . Cervical cancer Daughter        dx in her 5s  . Heart disease Sister   . Heart disease Mother   . Diabetes Mother   . Hypertension Mother   . Colon cancer Father 54  . Stroke Sister   . Hypertension Sister   . Esophageal cancer Brother 44       smoker  . Diabetes Brother   . Heart disease Brother   . Hypertension Brother   . Asthma Maternal Grandmother   . Colon cancer Paternal Aunt 89  . Breast cancer Paternal Grandmother        dx in her 66s  . Thyroid disease Other 15       niece  . Thyroid disease Sister    Past Surgical History:  Procedure Laterality Date  . BIOPSY THYROID    . BREAST SURGERY    . CATARACT EXTRACTION, BILATERAL  [id number]  . EYE SURGERY  08/2012   detached retina surg  . LAPAROSCOPY  1978   BTL  . lumpectomy  1997   Rt  . neck fusion  2010  . THYROIDECTOMY N/A 05/04/2013   Procedure:  TOTAL THYROIDECTOMY;  Surgeon: Earnstine Regal, MD;  Location: [facility];  Service: General;  Laterality: N/A;   Social History   Social History Narrative   Patient is  single and lives alone.   Patient has 3 children.   Patient is retired.   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   Patient is right handed.   Patient drinks two cups of coffee daily and rarely sodas.    There is no immunization history on file for this patient.   Objective: Vital Signs: BP (!) 150/80 (BP Location: Left Arm, Patient Position: Sitting, Cuff Size: Normal)   Pulse (!) 46   Resp 17   Ht 5\' 3"  (1.6 m)   Wt 173 lb (78.5 kg)   BMI 30.65 kg/m    Physical Exam Vitals and nursing note reviewed.  Constitutional:      Appearance: She is well-developed and well-nourished.  HENT:  Head: Normocephalic and atraumatic.  Eyes:     Extraocular Movements: EOM normal.     Conjunctiva/sclera: Conjunctivae normal.  Cardiovascular:     Rate and Rhythm: Normal rate and regular rhythm.     Pulses: Intact distal pulses.     Heart sounds: Normal heart sounds.  Pulmonary:     Effort: Pulmonary effort is normal.     Breath sounds: Normal breath sounds.  Abdominal:     General: Bowel sounds are normal.     Palpations: Abdomen is soft.  Musculoskeletal:     Cervical back: Normal range of motion.  Lymphadenopathy:     Cervical: No cervical adenopathy.  Skin:    General: Skin is warm and dry.     Capillary Refill: Capillary refill takes less than 2 seconds.  Neurological:     Mental Status: She is alert and oriented to person, place, and time.  Psychiatric:        Mood and Affect: Mood and affect normal.        Behavior: Behavior normal.      Musculoskeletal Exam: Had limited range of motion of her cervical spine and had a surgical scar. She had limited range of motion of her lumbar spine without much discomfort. Shoulder joints, elbow joints, wrist joints, MCPs PIPs and DIPs with good range of motion with no synovitis. She had very limited painful range of motion of her right hip joint. She had good range of motion of her right knee joint without any warmth  swelling or effusion. Left hip joint and left knee joint with good range of motion. She had bilateral hammertoes.  CDAI Exam: CDAI Score: -- Patient Global: --; Provider Global: -- Swollen: --; Tender: -- Joint Exam 11/03/2020   No joint exam has been documented for this visit   There is currently no information documented on the homunculus. Go to the Rheumatology activity and complete the homunculus joint exam.  Investigation: No additional findings.  Imaging: No results found.  Recent Labs: Lab Results  Component Value Date   WBC 7.2 01/24/2014   HGB 12.4 01/24/2014   PLT 276 01/24/2014   NA 133 (L) 01/24/2014   K 3.9 01/24/2014   CL 99 01/24/2014   CO2 22 01/24/2014   GLUCOSE 113 (H) 01/24/2014   BUN 12 01/24/2014   CREATININE 0.55 01/24/2014   BILITOT 0.4 01/24/2014   ALKPHOS 68 01/24/2014   AST 13 01/24/2014   ALT 15 01/24/2014   PROT 6.8 01/24/2014   ALBUMIN 4.1 01/24/2014   CALCIUM 9.7 01/24/2014   GFRAA >90 05/11/2013    June 14, 2019 x-rays done at [facility] report showed lumbar spine mild grade 1 anterolisthesis of L3-4 and moderate facet joint arthropathy  Speciality Comments: No specialty comments available.  Procedures:  No procedures performed Allergies: Erythromycin, Ezetimibe, Other, and Statins   Assessment / Plan:     Visit Diagnoses: Chronic low back pain, unspecified back pain laterality, unspecified whether sciatica present - mild spndylosis w/ multilevel DDD. minimal grade 1 anterolisthesis of L3 on L4. X-ray findings were discussed with the patient. She is currently not having much lower back discomfort. Although she has intermittent radiculopathy to her right lower extremity.  Core muscle strengthening exercises were discussed.  She has been followed by Dr. Nelva Bush.  DDD (degenerative disc disease), cervical-status post cervical spine fusion. She has limited range of motion without much discomfort currently.  Primary osteoarthritis of  right hip-patient has painful very limited range of motion of  her right hip joint. She states Dr. Alvan Dame did x-rays which showed severe osteoarthritis and advised total hip replacement. I was in agreement. I think her quality of life is going to improve after total hip replacement. She has difficulty walking. At this point I do not think any further autoimmune work-up is needed. I do not see any inflammation.  Primary osteoarthritis of right knee-she has been having some discomfort in her right knee joint. She states the x-rays done by Dr. Alvan Dame showed mild osteoarthritis. I do not have x-rays to review. She had no warmth swelling or effusion on my examination. She may be having referred pain to her knee joint from her hip.  Cervical dystonia  DDD (degenerative disc disease), lumbar-she has chronic lower back pain. She has tried physical therapy and also chiropractic care.  Essential hypertension, benign  Multiple thyroid nodules  S/P total thyroidectomy  Postoperative hypothyroidism  Former smoker - quit smoking 1969. Smoked for 2 years.  History of breast cancer - 1997, lumpectomy, RTX. No CTX.  History of glaucoma  Orders: No orders of the defined types were placed in this encounter.  No orders of the defined types were placed in this encounter.   .  Follow-Up Instructions: Return if symptoms worsen or fail to improve, for Osteoarthritis and DDD. I have advised patient to come back in case she has ongoing symptoms after right hip replacement. I have also advised her to bring copy of her x-rays at the follow-up visit.   Bo Merino, MD  Note - This record has been created using Editor, commissioning.  Chart creation errors have been sought, but may not always  have been located. Such creation errors do not reflect on  the standard of medical care.

## 2020-11-03 ENCOUNTER — Encounter (INDEPENDENT_AMBULATORY_CARE_PROVIDER_SITE_OTHER): Payer: Self-pay

## 2020-11-03 ENCOUNTER — Encounter: Payer: Self-pay | Admitting: Rheumatology

## 2020-11-03 ENCOUNTER — Other Ambulatory Visit: Payer: Self-pay

## 2020-11-03 ENCOUNTER — Ambulatory Visit: Payer: Medicare HMO | Admitting: Rheumatology

## 2020-11-03 VITALS — BP 150/80 | HR 46 | Resp 17 | Ht 63.0 in | Wt 173.0 lb

## 2020-11-03 DIAGNOSIS — G243 Spasmodic torticollis: Secondary | ICD-10-CM

## 2020-11-03 DIAGNOSIS — M545 Low back pain, unspecified: Secondary | ICD-10-CM | POA: Diagnosis not present

## 2020-11-03 DIAGNOSIS — E042 Nontoxic multinodular goiter: Secondary | ICD-10-CM

## 2020-11-03 DIAGNOSIS — M5030 Other cervical disc degeneration, unspecified cervical region: Secondary | ICD-10-CM

## 2020-11-03 DIAGNOSIS — Z8669 Personal history of other diseases of the nervous system and sense organs: Secondary | ICD-10-CM

## 2020-11-03 DIAGNOSIS — M1611 Unilateral primary osteoarthritis, right hip: Secondary | ICD-10-CM

## 2020-11-03 DIAGNOSIS — Z853 Personal history of malignant neoplasm of breast: Secondary | ICD-10-CM

## 2020-11-03 DIAGNOSIS — E890 Postprocedural hypothyroidism: Secondary | ICD-10-CM

## 2020-11-03 DIAGNOSIS — M1711 Unilateral primary osteoarthritis, right knee: Secondary | ICD-10-CM

## 2020-11-03 DIAGNOSIS — Z87891 Personal history of nicotine dependence: Secondary | ICD-10-CM

## 2020-11-03 DIAGNOSIS — M5136 Other intervertebral disc degeneration, lumbar region: Secondary | ICD-10-CM

## 2020-11-03 DIAGNOSIS — G8929 Other chronic pain: Secondary | ICD-10-CM

## 2020-11-03 DIAGNOSIS — I10 Essential (primary) hypertension: Secondary | ICD-10-CM

## 2020-11-28 ENCOUNTER — Ambulatory Visit: Payer: Self-pay | Admitting: Rheumatology

## 2020-12-11 NOTE — Patient Instructions (Addendum)
DUE TO COVID-19 ONLY ONE VISITOR IS ALLOWED TO COME WITH YOU AND STAY IN THE WAITING ROOM ONLY DURING PRE OP AND PROCEDURE DAY OF SURGERY. THE 1 VISITOR  MAY VISIT WITH YOU AFTER SURGERY IN YOUR PRIVATE ROOM DURING VISITING HOURS ONLY!  YOU NEED TO HAVE A COVID 19 TEST ON_1/21______ @_11 :30______, THIS TEST MUST BE DONE BEFORE SURGERY,  COVID TESTING SITE [place] [address], IT IS ON THE RIGHT GOING OUT [address] APPROXIMATELY  2 MINUTES PAST [employer] ON THE RIGHT. ONCE YOUR COVID TEST IS COMPLETED,  PLEASE BEGIN THE QUARANTINE INSTRUCTIONS AS OUTLINED IN YOUR HANDOUT.                Satomi Buda   Your procedure is scheduled on: 12/19/20   Report to [facility] Main  Entrance   Report to admitting at  9:00 AM     Call this number if you have problems the morning of surgery [place], NO CHEWING GUM [place].   No food after midnight.    You may have clear liquid until 8:30 AM.    At 8:00 AM drink pre surgery drink.   Nothing by mouth after 8:30 AM.   Take these medicines the morning of surgery with A SIP OF WATER: Gabapentin, Levothyroxine,  Use your eye drops as usual                                 You may not have any metal on your body including hair pins and              piercings  Do not wear jewelry, make-up, lotions, powders or perfumes, deodorant             Do not wear nail polish on your fingernails.  Do not shave  48 hours prior to surgery.         Do not bring valuables to the hospital. [place].  Contacts, dentures or bridgework may not be worn into surgery.      Patients discharged the day of surgery will not be allowed to drive home  . IF YOU ARE HAVING SURGERY AND GOING HOME THE SAME DAY, YOU MUST HAVE AN ADULT TO DRIVE YOU HOME AND BE WITH YOU FOR 24 HOURS.   YOU MAY GO HOME BY  TAXI OR UBER OR ORTHERWISE, BUT AN ADULT MUST ACCOMPANY YOU HOME AND STAY WITH YOU FOR 24 HOURS.  Name and phone number of your driver:  Special Instructions: N/A              Please read over the following fact sheets you were given: _____________________________________________________________________             [facility] - Preparing for Surgery Before surgery, you can play an important role.   Because skin is not sterile, your skin needs to be as free of germs as possible.   You can reduce the number of germs on your skin by washing with CHG (chlorahexidine gluconate) soap before surgery.   CHG is an antiseptic cleaner which kills germs and bonds with the skin to continue killing germs even after washing. Please DO NOT use if you have  an allergy to CHG or antibacterial soaps .  If your skin becomes reddened/irritated stop using the CHG and inform your nurse when you arrive at Short Stay. Do not shave (including legs and underarms) for at least 48 hours prior to the first CHG shower.    Please follow these instructions carefully:  1.  Shower with CHG Soap the night before surgery and the  morning of Surgery.  2.  If you choose to wash your hair, wash your hair first as usual with your  normal  shampoo.  3.  After you shampoo, rinse your hair and body thoroughly to remove the  shampoo.                                        4.  Use CHG as you would any other liquid soap.  You can apply chg directly  to the skin and wash                       Gently with a scrungie or clean washcloth.  5.  Apply the CHG Soap to your body ONLY FROM THE NECK DOWN.   Do not use on face/ open                           Wound or open sores. Avoid contact with eyes, ears mouth and genitals (private parts).                       Wash face,  Genitals (private parts) with your normal soap.             6.  Wash thoroughly, paying special attention to the area where your surgery  will be performed.  7.   Thoroughly rinse your body with warm water from the neck down.  8.  DO NOT shower/wash with your normal soap after using and rinsing off  the CHG Soap.             9.  Pat yourself dry with a clean towel.            10.  Wear clean pajamas.            11.  Place clean sheets on your bed the night of your first shower and do not  sleep with pets. Day of Surgery : Do not apply any lotions/deodorants the morning of surgery.  Please wear clean clothes to the hospital/surgery center.  FAILURE TO FOLLOW THESE INSTRUCTIONS MAY RESULT IN THE CANCELLATION OF YOUR SURGERY PATIENT SIGNATURE_________________________________  NURSE SIGNATURE__________________________________  ________________________________________________________________________   Adam Phenix  An incentive spirometer is a tool that can help keep your lungs clear and active. This tool measures how well you are filling your lungs with each breath. Taking long deep breaths may help reverse or decrease the chance of developing breathing (pulmonary) problems (especially infection) following:  A long period of time when you are unable to move or be active. BEFORE THE PROCEDURE   If the spirometer includes an indicator to show your best effort, your nurse or respiratory therapist will set it to a desired goal.  If possible, sit up straight or lean slightly forward. Try not to slouch.  Hold the incentive spirometer in an upright position. INSTRUCTIONS FOR USE  1. Sit on the edge of your  bed if possible, or sit up as far as you can in bed or on a chair. 2. Hold the incentive spirometer in an upright position. 3. Breathe out normally. 4. Place the mouthpiece in your mouth and seal your lips tightly around it. 5. Breathe in slowly and as deeply as possible, raising the piston or the ball toward the top of the column. 6. Hold your breath for 3-5 seconds or for as long as possible. Allow the piston or ball to fall to the bottom of  the column. 7. Remove the mouthpiece from your mouth and breathe out normally. 8. Rest for a few seconds and repeat Steps 1 through 7 at least 10 times every 1-2 hours when you are awake. Take your time and take a few normal breaths between deep breaths. 9. The spirometer may include an indicator to show your best effort. Use the indicator as a goal to work toward during each repetition. 10. After each set of 10 deep breaths, practice coughing to be sure your lungs are clear. If you have an incision (the cut made at the time of surgery), support your incision when coughing by placing a pillow or rolled up towels firmly against it. Once you are able to get out of bed, walk around indoors and cough well. You may stop using the incentive spirometer when instructed by your caregiver.  RISKS AND COMPLICATIONS  Take your time so you do not get dizzy or light-headed.  If you are in pain, you may need to take or ask for pain medication before doing incentive spirometry. It is harder to take a deep breath if you are having pain. AFTER USE  Rest and breathe slowly and easily.  It can be helpful to keep track of a log of your progress. Your caregiver can provide you with a simple table to help with this. If you are using the spirometer at home, follow these instructions: [place] IF:   You are having difficultly using the spirometer.  You have trouble using the spirometer as often as instructed.  Your pain medication is not giving enough relief while using the spirometer.  You develop fever of 100.5 F (38.1 C) or higher. SEEK IMMEDIATE MEDICAL CARE IF:   You cough up bloody sputum that had not been present before.  You develop fever of 102 F (38.9 C) or greater.  You develop worsening pain at or near the incision site. MAKE SURE YOU:   Understand these instructions.  Will watch your condition.  Will get help right away if you are not doing well or get worse. Document  Released: 03/24/2007 Document Revised: 02/03/2012 Document Reviewed: 05/25/2007 [facility] Patient Information 2014 [place], [place].   ________________________________________________________________________

## 2020-12-12 ENCOUNTER — Encounter (HOSPITAL_COMMUNITY): Payer: Self-pay

## 2020-12-12 ENCOUNTER — Encounter (HOSPITAL_COMMUNITY)
Admission: RE | Admit: 2020-12-12 | Discharge: 2020-12-12 | Disposition: A | Discharge status: Home / Self Care | Payer: Medicare HMO | Source: Ambulatory Visit | Attending: Orthopedic Surgery | Admitting: Orthopedic Surgery

## 2020-12-12 ENCOUNTER — Other Ambulatory Visit: Payer: Self-pay

## 2020-12-12 DIAGNOSIS — Z01818 Encounter for other preprocedural examination: Secondary | ICD-10-CM | POA: Insufficient documentation

## 2020-12-12 DIAGNOSIS — I10 Essential (primary) hypertension: Secondary | ICD-10-CM | POA: Diagnosis not present

## 2020-12-12 HISTORY — DX: Other specified postprocedural states: R11.2

## 2020-12-12 HISTORY — DX: Family history of other specified conditions: Z84.89

## 2020-12-12 HISTORY — DX: Other specified postprocedural states: Z98.890

## 2020-12-12 HISTORY — DX: Gastro-esophageal reflux disease without esophagitis: K21.9

## 2020-12-12 LAB — BASIC METABOLIC PANEL
Anion gap: 10 (ref 5–15)
BUN: 13 mg/dL (ref 8–23)
CO2: 25 mmol/L (ref 22–32)
Calcium: 9.1 mg/dL (ref 8.9–10.3)
Chloride: 99 mmol/L (ref 98–111)
Creatinine, Ser: 0.69 mg/dL (ref 0.44–1.00)
GFR, Estimated: >60 mL/min (ref >60)
Glucose, Bld: 92 mg/dL (ref 70–99)
Potassium: 4.8 mmol/L (ref 3.5–5.1)
Sodium: 134 mmol/L — ABNORMAL LOW (ref 135–145)

## 2020-12-12 LAB — CBC
HCT: 41.2 % (ref 36.0–46.0)
Hemoglobin: 13.7 g/dL (ref 12.0–15.0)
MCH: 31.1 pg (ref 26.0–34.0)
MCHC: 33.3 g/dL (ref 30.0–36.0)
MCV: 93.6 fL (ref 80.0–100.0)
Platelets: 310 10*3/uL (ref 150–400)
RBC: 4.4 MIL/uL (ref 3.87–5.11)
RDW: 11.8 % (ref 11.5–15.5)
WBC: 7.4 10*3/uL (ref 4.0–10.5)
nRBC: 0 % (ref 0.0–0.2)

## 2020-12-12 LAB — SURGICAL PCR SCREEN
MRSA, PCR: NEGATIVE
Staphylococcus aureus: NEGATIVE

## 2020-12-12 NOTE — Progress Notes (Signed)
COVID Vaccine Completed:No Date COVID Vaccine completed: COVID vaccine manufacturer: Pfizer    Moderna   [employer]   PCP - Dr. Gwenyth Allegra Cardiologist - no  Chest x-ray - no EKG - 12/12/20-epic, Chart Stress Test -no  ECHO - no Cardiac Cath - no Pacemaker/ICD device last checked:NA  Sleep Study - no CPAP -   Fasting Blood Sugar - NA Checks Blood Sugar _____ times a day  Blood Thinner Instructions:Pt takes ASA for pain /Dr/ Rankins Aspirin Instructions: Stop 7 days prior to DOS /Dr Alvan Dame Last Dose:12/12/20  Anesthesia review:   Patient denies shortness of breath, fever, cough and chest pain at PAT appointment yes  Patient verbalized understanding of instructions that were given to them at the PAT appointment. Patient was also instructed that they will need to review over the PAT instructions again at home before surgery. Yes Pt doesn't climb stairs. She feels that her Wt gain has made her SOB with some activities. 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 No problems with being intubated.

## 2020-12-15 ENCOUNTER — Other Ambulatory Visit (HOSPITAL_COMMUNITY): Payer: Medicare HMO

## 2020-12-19 LAB — TYPE AND SCREEN
ABO/RH(D): A POS
Antibody Screen: NEGATIVE

## 2020-12-19 NOTE — Progress Notes (Signed)
COVID Vaccine Completed:No Date COVID Vaccine completed: COVID vaccine manufacturer: Pfizer    Moderna   [employer]   PCP - Dr. Gwenyth Allegra Cardiologist - no  Chest x-ray - no EKG - 12/12/20-epic, Chart Stress Test -no  ECHO - no Cardiac Cath - no Pacemaker/ICD device last checked:NA  Sleep Study - no CPAP -   Fasting Blood Sugar - NA Checks Blood Sugar _____ times a day  Blood Thinner Instructions:Pt takes ASA for pain /Dr/ Rankins Aspirin Instructions: Stop 7 days prior to [place] /Dr Alvan Dame Last Dose:12/12/20  Anesthesia review:  Activity--- Patient denies shortness of breath, fever, cough and chest pain at PAT appointment yes  Patient verbalized understanding of instructions that were given to them at the PAT appointment. Patient was also instructed that they will need to review over the PAT instructions again at home before surgery. Yes Pt doesn't climb stairs. She feels that her Wt gain has made her SOB with some activities. 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 No problems with being intubated.

## 2020-12-19 NOTE — Patient Instructions (Signed)
DUE TO COVID-19 ONLY ONE VISITOR IS ALLOWED TO COME WITH YOU AND STAY IN THE WAITING ROOM ONLY DURING PRE OP AND PROCEDURE DAY OF SURGERY. THE 1 VISITOR  MAY VISIT WITH YOU AFTER SURGERY IN YOUR PRIVATE ROOM DURING VISITING HOURS ONLY!  YOU NEED TO HAVE A COVID 19 TEST ON   2-4-22______ @______ , THIS TEST MUST BE DONE BEFORE SURGERY,  COVID TESTING SITE [address] [place] [address], IT IS ON THE RIGHT GOING OUT [address] APPROXIMATELY  2 MINUTES PAST [employer] ON THE RIGHT. ONCE YOUR COVID TEST IS COMPLETED,  PLEASE BEGIN THE QUARANTINE INSTRUCTIONS AS OUTLINED IN YOUR HANDOUT.                [name] [name]   Your procedure is scheduled on: 01-02-21   Report to [facility] Main  Entrance   Report to admitting at    1025 AM     Call this number if you have problems the morning of surgery [place], NO [place].   No food after midnight.    You may have clear liquid until  0955  AM.    At 0955 AM drink pre surgery drink.   Nothing by mouth after   0955 AM.   Take these medicines the morning of surgery with A SIP OF WATER: Gabapentin, Levothyroxine,  Use your eye drops as usual                                 You may not have any metal on your body including hair pins and              piercings  Do not wear jewelry, make-up, lotions, powders or perfumes, deodorant             Do not wear nail polish on your fingernails.  Do not shave  48 hours prior to surgery.         Do not bring valuables to the hospital. [place].  Contacts, dentures or bridgework may not be worn into surgery.      Patients discharged the day of surgery will not be allowed to drive home  . IF YOU ARE HAVING SURGERY AND GOING HOME THE SAME DAY, YOU MUST HAVE AN ADULT TO DRIVE YOU HOME AND BE WITH YOU FOR 24 HOURS.   YOU MAY GO HOME BY  TAXI OR UBER OR ORTHERWISE, BUT AN ADULT MUST ACCOMPANY YOU HOME AND STAY WITH YOU FOR 24 HOURS.  Name and phone number of your driver:  Special Instructions: N/A              Please read over the following fact sheets you were given: _____________________________________________________________________             [facility] - Preparing for Surgery Before surgery, you can play an important role.   Because skin is not sterile, your skin needs to be as free of germs as possible.   You can reduce the number of germs on your skin by washing with CHG (chlorahexidine gluconate) soap before surgery.   CHG is an antiseptic cleaner which kills germs and bonds with the skin to continue killing germs even  after washing. Please DO NOT use if you have an allergy to CHG or antibacterial soaps .  If your skin becomes reddened/irritated stop using the CHG and inform your nurse when you arrive at Short Stay. Do not shave (including legs and underarms) for at least 48 hours prior to the first CHG shower.    Please follow these instructions carefully:  1.  Shower with CHG Soap the night before surgery and the  morning of Surgery.  2.  If you choose to wash your hair, wash your hair first as usual with your  normal  shampoo.  3.  After you shampoo, rinse your hair and body thoroughly to remove the  shampoo.                                        4.  Use CHG as you would any other liquid soap.  You can apply chg directly  to the skin and wash                       Gently with a scrungie or clean washcloth.  5.  Apply the CHG Soap to your body ONLY FROM THE NECK DOWN.   Do not use on face/ open                           Wound or open sores. Avoid contact with eyes, ears mouth and genitals (private parts).                       Wash face,  Genitals (private parts) with your normal soap.             6.  Wash thoroughly, paying special attention to the area where your surgery  will be performed.  7.   Thoroughly rinse your body with warm water from the neck down.  8.  DO NOT shower/wash with your normal soap after using and rinsing off  the CHG Soap.             9.  Pat yourself dry with a clean towel.            10.  Wear clean pajamas.            11.  Place clean sheets on your bed the night of your first shower and do not  sleep with pets. Day of Surgery : Do not apply any lotions/deodorants the morning of surgery.  Please wear clean clothes to the hospital/surgery center.  FAILURE TO FOLLOW THESE INSTRUCTIONS MAY RESULT IN THE CANCELLATION OF YOUR SURGERY PATIENT SIGNATURE_________________________________  NURSE SIGNATURE__________________________________  ________________________________________________________________________   [name] [name]  An incentive spirometer is a tool that can help keep your lungs clear and active. This tool measures how well you are filling your lungs with each breath. Taking long deep breaths may help reverse or decrease the chance of developing breathing (pulmonary) problems (especially infection) following:  A long period of time when you are unable to move or be active. BEFORE THE PROCEDURE   If the spirometer includes an indicator to show your best effort, your nurse or respiratory therapist will set it to a desired goal.  If possible, sit up straight or lean slightly forward. Try not to slouch.  Hold the incentive spirometer in an upright position. INSTRUCTIONS FOR  USE  1. Sit on the edge of your bed if possible, or sit up as far as you can in bed or on a chair. 2. Hold the incentive spirometer in an upright position. 3. Breathe out normally. 4. Place the mouthpiece in your mouth and seal your lips tightly around it. 5. Breathe in slowly and as deeply as possible, raising the piston or the ball toward the top of the column. 6. Hold your breath for 3-5 seconds or for as long as possible. Allow the piston or ball to fall to the bottom of  the column. 7. Remove the mouthpiece from your mouth and breathe out normally. 8. Rest for a few seconds and repeat Steps 1 through 7 at least 10 times every 1-2 hours when you are awake. Take your time and take a few normal breaths between deep breaths. 9. The spirometer may include an indicator to show your best effort. Use the indicator as a goal to work toward during each repetition. 10. After each set of 10 deep breaths, practice coughing to be sure your lungs are clear. If you have an incision (the cut made at the time of surgery), support your incision when coughing by placing a pillow or rolled up towels firmly against it. Once you are able to get out of bed, walk around indoors and cough well. You may stop using the incentive spirometer when instructed by your caregiver.  RISKS AND COMPLICATIONS  Take your time so you do not get dizzy or light-headed.  If you are in pain, you may need to take or ask for pain medication before doing incentive spirometry. It is harder to take a deep breath if you are having pain. AFTER USE  Rest and breathe slowly and easily.  It can be helpful to keep track of a log of your progress. Your caregiver can provide you with a simple table to help with this. If you are using the spirometer at home, follow these instructions: [place] IF:   You are having difficultly using the spirometer.  You have trouble using the spirometer as often as instructed.  Your pain medication is not giving enough relief while using the spirometer.  You develop fever of 100.5 F (38.1 C) or higher. SEEK IMMEDIATE MEDICAL CARE IF:   You cough up bloody sputum that had not been present before.  You develop fever of 102 F (38.9 C) or greater.  You develop worsening pain at or near the incision site. MAKE SURE YOU:   Understand these instructions.  Will watch your condition.  Will get help right away if you are not doing well or get worse. Document  Released: 03/24/2007 Document Revised: 02/03/2012 Document Reviewed: 05/25/2007 [facility] Patient Information 2014 [place], [place].   ________________________________________________________________________

## 2020-12-27 ENCOUNTER — Encounter (HOSPITAL_COMMUNITY)
Admission: RE | Admit: 2020-12-27 | Discharge: 2020-12-27 | Disposition: A | Discharge status: Home / Self Care | Payer: Medicare HMO | Source: Ambulatory Visit | Attending: Orthopedic Surgery | Admitting: Orthopedic Surgery

## 2020-12-27 ENCOUNTER — Other Ambulatory Visit: Payer: Self-pay

## 2020-12-27 ENCOUNTER — Encounter (HOSPITAL_COMMUNITY): Payer: Self-pay

## 2020-12-27 DIAGNOSIS — Z01812 Encounter for preprocedural laboratory examination: Secondary | ICD-10-CM | POA: Insufficient documentation

## 2020-12-27 HISTORY — DX: Hypothyroidism, unspecified: E03.9

## 2020-12-27 LAB — CBC
HCT: 38.7 % (ref 36.0–46.0)
Hemoglobin: 12.9 g/dL (ref 12.0–15.0)
MCH: 31.1 pg (ref 26.0–34.0)
MCHC: 33.3 g/dL (ref 30.0–36.0)
MCV: 93.3 fL (ref 80.0–100.0)
Platelets: 280 10*3/uL (ref 150–400)
RBC: 4.15 MIL/uL (ref 3.87–5.11)
RDW: 12.3 % (ref 11.5–15.5)
WBC: 6.3 10*3/uL (ref 4.0–10.5)
nRBC: 0 % (ref 0.0–0.2)

## 2020-12-27 LAB — BASIC METABOLIC PANEL
Anion gap: 8 (ref 5–15)
BUN: 12 mg/dL (ref 8–23)
CO2: 25 mmol/L (ref 22–32)
Calcium: 9.5 mg/dL (ref 8.9–10.3)
Chloride: 101 mmol/L (ref 98–111)
Creatinine, Ser: 0.62 mg/dL (ref 0.44–1.00)
GFR, Estimated: >60 mL/min (ref >60)
Glucose, Bld: 95 mg/dL (ref 70–99)
Potassium: 4.6 mmol/L (ref 3.5–5.1)
Sodium: 134 mmol/L — ABNORMAL LOW (ref 135–145)

## 2020-12-27 LAB — SURGICAL PCR SCREEN
MRSA, PCR: NEGATIVE
Staphylococcus aureus: NEGATIVE

## 2020-12-27 NOTE — H&P (Signed)
TOTAL HIP ADMISSION H&P  Patient is admitted for right total hip arthroplasty.  Subjective:  Chief Complaint: Right hip OA / pain  HPI: [name] [name], 75 y.o. female, has a history of pain and functional disability in the right hip(s) due to arthritis and patient has failed non-surgical conservative treatments for greater than 12 weeks to include NSAID's and/or analgesics and activity modification.  Onset of symptoms was gradual starting 2.5 years ago with gradually worsening course since that time.The patient noted no past surgery on the right hip(s).  Patient currently rates pain in the right hip at 6 out of 10 with activity. Patient has night pain, worsening of pain with activity and weight bearing, pain that interfers with activities of daily living and pain with passive range of motion. Patient has evidence of periarticular osteophytes and joint space narrowing by imaging studies. This condition presents safety issues increasing the risk of falls.   There is no current active infection.  Risks, benefits and expectations were discussed with the patient.  Risks including but not limited to the risk of anesthesia, blood clots, nerve damage, blood vessel damage, failure of the prosthesis, infection and up to and including death.  Patient understand the risks, benefits and expectations and wishes to proceed with surgery.    D/C Plans:       Home (SD if needed)  Post-op Meds:       No Rx given   Tranexamic Acid:      To be given - IV   Decadron:      Is to be given  FYI:      ASA  Norco  DME:   Rx sent for - RW & 3-N-1  PT:   HEP  Pharmacy: [facility] [place]   Patient Active Problem List   Diagnosis Date Noted  . Right sided weakness 10/28/2013  . 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 Chronic pain 10/28/2013  . S/P total thyroidectomy 08/03/2013  . Hypothyroidism 08/03/2013  . Essential hypertension, benign 06/30/2013  . Other forms of retinal detachment(361.89) 01/18/2013  . Urinary  incontinence, mixed 01/18/2013  . Unspecified constipation 01/18/2013  . HTN (hypertension) 12/28/2012  . Family history of colon cancer 05/30/2012  . DJD (degenerative joint disease), cervical 05/30/2012  . Multiple thyroid nodules 04/06/2012  . Glaucoma 03/19/2012  . Former smoker 03/19/2012  . Chronic pain following surgery or procedure 03/19/2012  . Breast calcifications on mammogram 03/19/2012  . Anemia   . Breast cancer ([place])   . Cataracts, bilateral   . Depression   . High cholesterol    Past Medical History:  Diagnosis Date  . Abnormal Pap smear   . Abnormal peripheral vision    RT EYE  . Anemia   . Arthritis    back and neck  . Breast cancer ([place])    right breast  . Bruises easily   . Cataracts, bilateral   . Chronic back pain   . Chronic neck pain   . Complication of anesthesia    slow to wake up  . Family history of adverse reaction to anesthesia    Dad takes a long time to wake up  . Frequency of urination   . GERD (gastroesophageal reflux disease)   . Goiter   . High cholesterol   . History of postoperative nausea and vomiting   . Hypertension   . Numbness    hands / feet /arms / legs - followed by Dr. Georgann Housekeeper - [place]  . PONV (  postoperative nausea and vomiting)   . Post-menopausal   . Sexual dysfunction   . Shortness of breath    with exertion and Wt gain  . Urinary incontinence     Past Surgical History:  Procedure Laterality Date  . BIOPSY THYROID    . BREAST SURGERY Right    lymph node removed  . CATARACT EXTRACTION, BILATERAL  [id number]  . EYE SURGERY  08/2012   detached retina surg  . LAPAROSCOPY  1978   BTL  . lumpectomy  1997   Rt  . neck fusion  2010  . THYROIDECTOMY N/A 05/04/2013   Procedure:  TOTAL THYROIDECTOMY;  Surgeon: Earnstine Regal, MD;  Location: [facility];  Service: General;  Laterality: N/A;    No current facility-administered medications for this encounter.   Current Outpatient Medications  Medication Sig Dispense  Refill Last Dose  . acetaminophen (TYLENOL) 500 MG tablet Take 500 mg by mouth every 6 (six) hours as needed for pain.     Marland Kitchen aspirin 81 MG tablet Take 325 mg by mouth daily as needed for pain.     Marland Kitchen BIOTIN PO Take 1 capsule by mouth daily.     . cholecalciferol (VITAMIN D) 1000 UNITS tablet Take 2,000 Units by mouth daily.     . Cyanocobalamin (VITAMIN B 12 PO) Take 1 tablet by mouth daily.     . dorzolamide-timolol (COSOPT) 22.3-6.8 MG/ML ophthalmic solution Place 1 drop into both eyes 2 (two) times daily.      . fish oil-omega-3 fatty acids 1000 MG capsule Take 2 g by mouth daily.     Marland Kitchen gabapentin (NEURONTIN) 300 MG capsule Take 1 capsule (300 mg total) by mouth 3 (three) times daily. (Patient taking differently: Take 600 mg by mouth 3 (three) times daily. Takes 2 by mouth 3 times daily) 270 capsule 3   . irbesartan (AVAPRO) 300 MG tablet Take 300 mg by mouth daily.     Marland Kitchen latanoprost (XALATAN) 0.005 % ophthalmic solution Place 1 drop into the left eye at bedtime.     Marland Kitchen levothyroxine (SYNTHROID) 100 MCG tablet Take 100 mcg by mouth daily.     . Misc Natural Products (LUTEIN 20 PO) Take 1 tablet by mouth daily.     . Multiple Vitamin (MULTIVITAMIN ADULT) TABS Take 1 tablet by mouth daily.     . polyvinyl alcohol (LIQUIFILM TEARS) 1.4 % ophthalmic solution Place 1 drop into both eyes daily as needed (for dry eyes).      Allergies  Allergen Reactions  . Erythromycin Nausea Only    Other reaction(s): Unknown  . Ezetimibe     Other reaction(s): myalgias  . Other Itching    Unknown Dye PET scan dye possibly= itching  . Statins     Other reaction(s): myalgias    Social History   Tobacco Use  . Smoking status: Former Smoker    Packs/day: 0.50    Years: 3.00    Pack years: 1.50    Types: Cigarettes    Start date: 01/24/1966    Quit date: 04/27/1968    Years since quitting: 52.7  . Smokeless tobacco: Never Used  . Tobacco comment: quit over 40 years ago  Substance Use Topics  . Alcohol  use: Yes    Comment: 1 every other month    Family History  Problem Relation Age of Onset  . Cervical cancer Daughter        dx in her 68s  . Heart disease Sister   .  Heart disease Mother   . Diabetes Mother   . Hypertension Mother   . Colon cancer Father 72  . Stroke Sister   . Hypertension Sister   . Esophageal cancer Brother 40       smoker  . Diabetes Brother   . Heart disease Brother   . Hypertension Brother   . Asthma Maternal Grandmother   . Colon cancer Paternal Aunt 89  . Breast cancer Paternal Grandmother        dx in her 2s  . Thyroid disease Other 15       niece  . Thyroid disease Sister      Review of Systems  Constitutional: Negative.   HENT: Negative.   Eyes: Negative.   Respiratory: Negative.   Cardiovascular: Negative.   Gastrointestinal: Positive for heartburn.  Genitourinary: Negative.   Musculoskeletal: Positive for joint pain.  Skin: Negative.   Neurological: Negative.   Endo/Heme/Allergies: Negative.   Psychiatric/Behavioral: Negative.      Objective:  Physical Exam Constitutional:      Appearance: She is well-developed.  HENT:     Head: Normocephalic.  Eyes:     Pupils: Pupils are equal, round, and reactive to light.  Neck:     Thyroid: No thyromegaly.     Vascular: No JVD.     Trachea: No tracheal deviation.  Cardiovascular:     Rate and Rhythm: Normal rate and regular rhythm.     Pulses: Intact distal pulses.  Pulmonary:     Effort: Pulmonary effort is normal. No respiratory distress.     Breath sounds: Normal breath sounds. No wheezing.  Abdominal:     Palpations: Abdomen is soft.     Tenderness: There is no abdominal tenderness. There is no guarding.  Musculoskeletal:     Cervical back: Neck supple.     Right hip: Tenderness and bony tenderness present. No deformity. Decreased range of motion. Decreased strength.  Lymphadenopathy:     Cervical: No cervical adenopathy.  Skin:    General: Skin is warm and dry.   Neurological:     Mental Status: She is alert and oriented to person, place, and time.  Psychiatric:        Mood and Affect: Mood and affect normal.       Labs:  Estimated body mass index is 29.58 kg/m as calculated from the following:   Height as of 12/12/20: 5\' 3"  (1.6 m).   Weight as of 12/12/20: 75.8 kg.   Imaging Review Plain radiographs demonstrate severe degenerative joint disease of the right hip. The bone quality appears to be good for age and reported activity level.      Assessment/Plan:  End stage arthritis, right hip  The patient history, physical examination, clinical judgement of the provider and imaging studies are consistent with end stage degenerative joint disease of the right hip and total hip arthroplasty is deemed medically necessary. The treatment options including medical management, injection therapy, arthroscopy and arthroplasty were discussed at length. The risks and benefits of total hip arthroplasty were presented and reviewed. The risks due to aseptic loosening, infection, stiffness, dislocation/subluxation,  thromboembolic complications and other imponderables were discussed.  The patient acknowledged the explanation, agreed to proceed with the plan and consent was signed. Patient is being admitted for treatment for surgery, pain control, PT, OT, prophylactic antibiotics, VTE prophylaxis, progressive ambulation and ADL's and discharge planning.The patient is planning to be discharged home.      Patient's anticipated LOS is less than  2 midnights, meeting these requirements: - Lives within 1 hour of care - Has a competent adult at home to recover with post-op recover - NO history of  - Chronic pain requiring opiods  - Diabetes  - Coronary Artery Disease  - Heart failure  - Heart attack  - Stroke  - DVT/VTE  - Cardiac arrhythmia  - Respiratory Failure/COPD  - Renal failure  - Anemia  - Advanced Liver disease

## 2020-12-27 NOTE — Progress Notes (Signed)
DUE TO COVID-19 ONLY ONE VISITOR IS ALLOWED TO COME WITH YOU AND STAY IN THE WAITING ROOM ONLY DURING PRE OP AND PROCEDURE DAY OF SURGERY. THE 1 VISITOR  MAY VISIT WITH YOU AFTER SURGERY IN YOUR PRIVATE ROOM DURING VISITING HOURS ONLY!  YOU NEED TO HAVE A COVID 19 TEST ON_2/02/2021 ______ @_______ , THIS TEST MUST BE DONE BEFORE SURGERY,  COVID TESTING SITE [address] [place]  [id number], IT IS ON THE RIGHT GOING OUT [address] APPROXIMATELY  2 MINUTES PAST [employer] ON THE RIGHT. ONCE YOUR COVID TEST IS COMPLETED,  PLEASE BEGIN THE QUARANTINE INSTRUCTIONS AS OUTLINED IN YOUR HANDOUT.                [name] [name]  12/27/2020   Your procedure is scheduled on: 01/02/2021    Report to [facility] Main  Entrance   Report to admitting at    1025am  AM     Call this number if you have problems the morning of surgery [phone]    REMEMBER: NO  SOLID FOOD CANDY OR GUM AFTER MIDNIGHT. CLEAR LIQUIDS UNTIL         . NOTHING BY MOUTH EXCEPT CLEAR LIQUIDS UNTIL     0950am    . PLEASE FINISH ENSURE DRINK PER SURGEON ORDER  WHICH NEEDS TO BE COMPLETED AT   0950am    .      CLEAR LIQUID DIET   Foods Allowed                                                                    Coffee and tea, regular and decaf                            Fruit ices (not with fruit pulp)                                      Iced Popsicles                                    Carbonated beverages, regular and diet                                    Cranberry, grape and apple juices Sports drinks like Gatorade Lightly seasoned clear broth or consume(fat free) Sugar, honey syrup ___________________________________________________________________      BRUSH YOUR TEETH MORNING OF SURGERY AND RINSE YOUR MOUTH OUT, NO CHEWING GUM CANDY OR MINTS.     Take these medicines the morning of surgery with A SIP OF WATER: none   DO NOT TAKE ANY DIABETIC MEDICATIONS DAY OF YOUR SURGERY                                You may not have any metal on your body including hair pins and  piercings  Do not wear jewelry, make-up, lotions, powders or perfumes, deodorant             Do not wear nail polish on your fingernails.  Do not shave  48 hours prior to surgery.              Men may shave face and neck.   Do not bring valuables to the hospital. [place].  Contacts, dentures or bridgework may not be worn into surgery.  Leave suitcase in the car. After surgery it may be brought to your room.     Patients discharged the day of surgery will not be allowed to drive home. IF YOU ARE HAVING SURGERY AND GOING HOME THE SAME DAY, YOU MUST HAVE AN ADULT TO DRIVE YOU HOME AND BE WITH YOU FOR 24 HOURS. YOU MAY GO HOME BY TAXI OR UBER OR ORTHERWISE, BUT AN ADULT MUST ACCOMPANY YOU HOME AND STAY WITH YOU FOR 24 HOURS.  Name and phone number of your driver:  Special Instructions: N/A              Please read over the following fact sheets you were given: _____________________________________________________________________  [facility] - Preparing for Surgery Before surgery, you can play an important role.  Because skin is not sterile, your skin needs to be as free of germs as possible.  You can reduce the number of germs on your skin by washing with CHG (chlorahexidine gluconate) soap before surgery.  CHG is an antiseptic cleaner which kills germs and bonds with the skin to continue killing germs even after washing. Please DO NOT use if you have an allergy to CHG or antibacterial soaps.  If your skin becomes reddened/irritated stop using the CHG and inform your nurse when you arrive at Short Stay. Do not shave (including legs and underarms) for at least 48 hours prior to the first CHG shower.  You may shave your face/neck. Please follow these instructions carefully:  1.  Shower with CHG Soap the night before surgery and the  morning of  Surgery.  2.  If you choose to wash your hair, wash your hair first as usual with your  normal  shampoo.  3.  After you shampoo, rinse your hair and body thoroughly to remove the  shampoo.                           4.  Use CHG as you would any other liquid soap.  You can apply chg directly  to the skin and wash                       Gently with a scrungie or clean washcloth.  5.  Apply the CHG Soap to your body ONLY FROM THE NECK DOWN.   Do not use on face/ open                           Wound or open sores. Avoid contact with eyes, ears mouth and genitals (private parts).                       Wash face,  Genitals (private parts) with your normal soap.             6.  Wash thoroughly, paying special attention to the area where your surgery  will be performed.  7.  Thoroughly rinse your body with warm water from the neck down.  8.  DO NOT shower/wash with your normal soap after using and rinsing off  the CHG Soap.                9.  Pat yourself dry with a clean towel.            10.  Wear clean pajamas.            11.  Place clean sheets on your bed the night of your first shower and do not  sleep with pets. Day of Surgery : Do not apply any lotions/deodorants the morning of surgery.  Please wear clean clothes to the hospital/surgery center.  FAILURE TO FOLLOW THESE INSTRUCTIONS MAY RESULT IN THE CANCELLATION OF YOUR SURGERY PATIENT SIGNATURE_________________________________  NURSE SIGNATURE__________________________________  ________________________________________________________________________

## 2020-12-29 ENCOUNTER — Other Ambulatory Visit (HOSPITAL_COMMUNITY)
Admission: RE | Admit: 2020-12-29 | Discharge: 2020-12-29 | Disposition: A | Discharge status: Home / Self Care | Payer: Medicare HMO | Source: Ambulatory Visit | Attending: Orthopedic Surgery | Admitting: Orthopedic Surgery

## 2020-12-29 DIAGNOSIS — Z20822 Contact with and (suspected) exposure to covid-19: Secondary | ICD-10-CM | POA: Diagnosis not present

## 2020-12-29 DIAGNOSIS — Z01812 Encounter for preprocedural laboratory examination: Secondary | ICD-10-CM | POA: Insufficient documentation

## 2020-12-30 LAB — SARS CORONAVIRUS 2 (TAT 6-24 HRS): SARS Coronavirus 2: NEGATIVE

## 2021-01-02 ENCOUNTER — Observation Stay (HOSPITAL_COMMUNITY)
Admission: RE | Admit: 2021-01-02 | Discharge: 2021-01-03 | Disposition: A | Discharge status: Home / Self Care | Payer: Medicare HMO | Attending: Orthopedic Surgery | Admitting: Orthopedic Surgery

## 2021-01-02 ENCOUNTER — Encounter (HOSPITAL_COMMUNITY)
Admission: RE | Disposition: A | Discharge status: Home / Self Care | Payer: Self-pay | Source: Home / Self Care | Attending: Orthopedic Surgery

## 2021-01-02 ENCOUNTER — Ambulatory Visit (HOSPITAL_COMMUNITY): Payer: Medicare HMO

## 2021-01-02 ENCOUNTER — Ambulatory Visit (HOSPITAL_COMMUNITY): Payer: Medicare HMO | Admitting: Physician Assistant

## 2021-01-02 ENCOUNTER — Encounter (HOSPITAL_COMMUNITY): Payer: Self-pay | Admitting: Orthopedic Surgery

## 2021-01-02 ENCOUNTER — Other Ambulatory Visit: Payer: Self-pay

## 2021-01-02 ENCOUNTER — Ambulatory Visit (HOSPITAL_COMMUNITY): Payer: Medicare HMO | Admitting: Certified Registered Nurse Anesthetist

## 2021-01-02 DIAGNOSIS — Z79899 Other long term (current) drug therapy: Secondary | ICD-10-CM | POA: Diagnosis not present

## 2021-01-02 DIAGNOSIS — Z7982 Long term (current) use of aspirin: Secondary | ICD-10-CM | POA: Insufficient documentation

## 2021-01-02 DIAGNOSIS — M1611 Unilateral primary osteoarthritis, right hip: Secondary | ICD-10-CM | POA: Diagnosis present

## 2021-01-02 DIAGNOSIS — Z87891 Personal history of nicotine dependence: Secondary | ICD-10-CM | POA: Insufficient documentation

## 2021-01-02 DIAGNOSIS — Z853 Personal history of malignant neoplasm of breast: Secondary | ICD-10-CM | POA: Insufficient documentation

## 2021-01-02 DIAGNOSIS — E890 Postprocedural hypothyroidism: Secondary | ICD-10-CM | POA: Diagnosis not present

## 2021-01-02 DIAGNOSIS — Z96641 Presence of right artificial hip joint: Secondary | ICD-10-CM

## 2021-01-02 DIAGNOSIS — I10 Essential (primary) hypertension: Secondary | ICD-10-CM | POA: Diagnosis not present

## 2021-01-02 DIAGNOSIS — Z96649 Presence of unspecified artificial hip joint: Secondary | ICD-10-CM

## 2021-01-02 DIAGNOSIS — Z419 Encounter for procedure for purposes other than remedying health state, unspecified: Secondary | ICD-10-CM

## 2021-01-02 HISTORY — PX: TOTAL HIP ARTHROPLASTY: SHX124

## 2021-01-02 LAB — TYPE AND SCREEN
ABO/RH(D): A POS
Antibody Screen: NEGATIVE

## 2021-01-02 SURGERY — ARTHROPLASTY, HIP, TOTAL, ANTERIOR APPROACH
Anesthesia: Spinal | Site: Hip | Laterality: Right

## 2021-01-02 MED ORDER — FERROUS SULFATE 325 (65 FE) MG PO TABS
325.0000 mg | ORAL_TABLET | Freq: Three times a day (TID) | ORAL | 0 refills | Status: DC
Start: 2021-01-02 — End: 2021-10-24

## 2021-01-02 MED ORDER — FENTANYL CITRATE (PF) 100 MCG/2ML IJ SOLN: 25.0000 ug | INTRAMUSCULAR | Status: DC | PRN

## 2021-01-02 MED ORDER — ASPIRIN 81 MG PO CHEW
81.0000 mg | CHEWABLE_TABLET | Freq: Two times a day (BID) | ORAL | Status: DC
  Administered 2021-01-02 – 2021-01-03 (×2): 81 mg via ORAL
  Filled 2021-01-02 (×2): qty 1

## 2021-01-02 MED ORDER — DORZOLAMIDE HCL-TIMOLOL MAL 2-0.5 % OP SOLN
1.0000 [drp] | Freq: Two times a day (BID) | OPHTHALMIC | Status: DC
  Filled 2021-01-02: qty 10

## 2021-01-02 MED ORDER — CHLORHEXIDINE GLUCONATE 0.12 % MT SOLN
15.0000 mL | Freq: Once | OROMUCOSAL | Status: AC
  Administered 2021-01-02: 15 mL via OROMUCOSAL

## 2021-01-02 MED ORDER — LATANOPROST 0.005 % OP SOLN
1.0000 [drp] | Freq: Every day | OPHTHALMIC | Status: DC
  Filled 2021-01-02: qty 2.5

## 2021-01-02 MED ORDER — TRANEXAMIC ACID-NACL 1000-0.7 MG/100ML-% IV SOLN
1000.0000 mg | INTRAVENOUS | Status: AC
  Administered 2021-01-02: 1000 mg via INTRAVENOUS
  Filled 2021-01-02: qty 100

## 2021-01-02 MED ORDER — PROPOFOL 500 MG/50ML IV EMUL
INTRAVENOUS | Status: DC | PRN
  Administered 2021-01-02: 100 ug/kg/min via INTRAVENOUS

## 2021-01-02 MED ORDER — CEFAZOLIN SODIUM-DEXTROSE 2-4 GM/100ML-% IV SOLN
2.0000 g | INTRAVENOUS | Status: AC
  Administered 2021-01-02: 2 g via INTRAVENOUS
  Filled 2021-01-02: qty 100

## 2021-01-02 MED ORDER — METHOCARBAMOL 1000 MG/10ML IJ SOLN
500.0000 mg | Freq: Four times a day (QID) | INTRAVENOUS | Status: DC | PRN
  Filled 2021-01-02: qty 5

## 2021-01-02 MED ORDER — HYDROCODONE-ACETAMINOPHEN 7.5-325 MG PO TABS: 1.0000 | ORAL_TABLET | ORAL | 0 refills | Status: DC | PRN

## 2021-01-02 MED ORDER — ORAL CARE MOUTH RINSE: 15.0000 mL | Freq: Once | OROMUCOSAL | Status: AC

## 2021-01-02 MED ORDER — DIPHENHYDRAMINE HCL 12.5 MG/5ML PO ELIX: 12.5000 mg | ORAL_SOLUTION | ORAL | Status: DC | PRN

## 2021-01-02 MED ORDER — GABAPENTIN 300 MG PO CAPS
600.0000 mg | ORAL_CAPSULE | Freq: Three times a day (TID) | ORAL | Status: DC
  Administered 2021-01-02 – 2021-01-03 (×3): 600 mg via ORAL
  Filled 2021-01-02 (×3): qty 2

## 2021-01-02 MED ORDER — STERILE WATER FOR IRRIGATION IR SOLN
Status: DC | PRN
  Administered 2021-01-02: 1000 mL

## 2021-01-02 MED ORDER — METOCLOPRAMIDE HCL 5 MG/ML IJ SOLN: 5.0000 mg | Freq: Three times a day (TID) | INTRAMUSCULAR | Status: DC | PRN

## 2021-01-02 MED ORDER — ONDANSETRON HCL 4 MG/2ML IJ SOLN
4.0000 mg | Freq: Four times a day (QID) | INTRAMUSCULAR | Status: DC | PRN
  Administered 2021-01-03: 4 mg via INTRAVENOUS
  Filled 2021-01-02: qty 2

## 2021-01-02 MED ORDER — METHOCARBAMOL 500 MG PO TABS
500.0000 mg | ORAL_TABLET | Freq: Four times a day (QID) | ORAL | Status: DC | PRN
  Administered 2021-01-02 – 2021-01-03 (×3): 500 mg via ORAL
  Filled 2021-01-02 (×3): qty 1

## 2021-01-02 MED ORDER — SODIUM CHLORIDE 0.9 % IR SOLN
Status: DC | PRN
  Administered 2021-01-02: 1000 mL

## 2021-01-02 MED ORDER — CEFAZOLIN SODIUM-DEXTROSE 2-4 GM/100ML-% IV SOLN
2.0000 g | Freq: Four times a day (QID) | INTRAVENOUS | Status: AC
  Administered 2021-01-02 (×2): 2 g via INTRAVENOUS
  Filled 2021-01-02 (×2): qty 100

## 2021-01-02 MED ORDER — HYDROCODONE-ACETAMINOPHEN 5-325 MG PO TABS
1.0000 | ORAL_TABLET | ORAL | Status: DC | PRN
  Administered 2021-01-02: 1 via ORAL
  Administered 2021-01-03 (×2): 2 via ORAL
  Filled 2021-01-02: qty 1
  Filled 2021-01-02 (×2): qty 2

## 2021-01-02 MED ORDER — METOCLOPRAMIDE HCL 5 MG PO TABS: 5.0000 mg | ORAL_TABLET | Freq: Three times a day (TID) | ORAL | Status: DC | PRN

## 2021-01-02 MED ORDER — ACETAMINOPHEN 325 MG PO TABS: 325.0000 mg | ORAL_TABLET | Freq: Four times a day (QID) | ORAL | Status: DC | PRN

## 2021-01-02 MED ORDER — DOCUSATE SODIUM 100 MG PO CAPS
100.0000 mg | ORAL_CAPSULE | Freq: Two times a day (BID) | ORAL | Status: DC
  Administered 2021-01-02 – 2021-01-03 (×2): 100 mg via ORAL
  Filled 2021-01-02 (×2): qty 1

## 2021-01-02 MED ORDER — ONDANSETRON HCL 4 MG/2ML IJ SOLN
INTRAMUSCULAR | Status: AC
  Filled 2021-01-02: qty 2

## 2021-01-02 MED ORDER — BISACODYL 10 MG RE SUPP: 10.0000 mg | Freq: Every day | RECTAL | Status: DC | PRN

## 2021-01-02 MED ORDER — LACTATED RINGERS IV SOLN: INTRAVENOUS | Status: DC

## 2021-01-02 MED ORDER — ONDANSETRON HCL 4 MG/2ML IJ SOLN: 4.0000 mg | Freq: Once | INTRAMUSCULAR | Status: DC | PRN

## 2021-01-02 MED ORDER — DEXAMETHASONE SODIUM PHOSPHATE 10 MG/ML IJ SOLN
INTRAMUSCULAR | Status: AC
  Filled 2021-01-02: qty 1

## 2021-01-02 MED ORDER — DOCUSATE SODIUM 100 MG PO CAPS: 100.0000 mg | ORAL_CAPSULE | Freq: Two times a day (BID) | ORAL | 0 refills | Status: DC

## 2021-01-02 MED ORDER — EPHEDRINE SULFATE-NACL 50-0.9 MG/10ML-% IV SOSY
PREFILLED_SYRINGE | INTRAVENOUS | Status: DC | PRN
  Administered 2021-01-02: 5 mg via INTRAVENOUS
  Administered 2021-01-02: 15 mg via INTRAVENOUS
  Administered 2021-01-02: 5 mg via INTRAVENOUS
  Administered 2021-01-02: 10 mg via INTRAVENOUS

## 2021-01-02 MED ORDER — ALUM & MAG HYDROXIDE-SIMETH 200-200-20 MG/5ML PO SUSP: 15.0000 mL | ORAL | Status: DC | PRN

## 2021-01-02 MED ORDER — PHENOL 1.4 % MT LIQD: 1.0000 | OROMUCOSAL | Status: DC | PRN

## 2021-01-02 MED ORDER — HYDROMORPHONE HCL 1 MG/ML IJ SOLN: 0.5000 mg | INTRAMUSCULAR | Status: DC | PRN

## 2021-01-02 MED ORDER — IRBESARTAN 150 MG PO TABS
300.0000 mg | ORAL_TABLET | Freq: Every day | ORAL | Status: DC
  Administered 2021-01-03: 300 mg via ORAL
  Filled 2021-01-02 (×2): qty 2

## 2021-01-02 MED ORDER — TRANEXAMIC ACID-NACL 1000-0.7 MG/100ML-% IV SOLN
1000.0000 mg | Freq: Once | INTRAVENOUS | Status: AC
  Administered 2021-01-02: 1000 mg via INTRAVENOUS
  Filled 2021-01-02: qty 100

## 2021-01-02 MED ORDER — LACTATED RINGERS IV BOLUS: 500.0000 mL | Freq: Once | INTRAVENOUS | Status: DC

## 2021-01-02 MED ORDER — POLYETHYLENE GLYCOL 3350 17 G PO PACK
17.0000 g | PACK | Freq: Two times a day (BID) | ORAL | Status: DC
  Administered 2021-01-02 – 2021-01-03 (×2): 17 g via ORAL
  Filled 2021-01-02 (×2): qty 1

## 2021-01-02 MED ORDER — ACETAMINOPHEN 10 MG/ML IV SOLN: 1000.0000 mg | Freq: Once | INTRAVENOUS | Status: DC | PRN

## 2021-01-02 MED ORDER — HYDROCODONE-ACETAMINOPHEN 7.5-325 MG PO TABS
1.0000 | ORAL_TABLET | ORAL | Status: DC | PRN
  Administered 2021-01-02: 2 via ORAL
  Administered 2021-01-03: 1 via ORAL
  Filled 2021-01-02: qty 2
  Filled 2021-01-02: qty 1

## 2021-01-02 MED ORDER — METHOCARBAMOL 500 MG PO TABS: 500.0000 mg | ORAL_TABLET | Freq: Four times a day (QID) | ORAL | 0 refills | Status: DC | PRN

## 2021-01-02 MED ORDER — MENTHOL 3 MG MT LOZG: 1.0000 | LOZENGE | OROMUCOSAL | Status: DC | PRN

## 2021-01-02 MED ORDER — DEXAMETHASONE SODIUM PHOSPHATE 10 MG/ML IJ SOLN
10.0000 mg | Freq: Once | INTRAMUSCULAR | Status: AC
  Administered 2021-01-03: 10 mg via INTRAVENOUS
  Filled 2021-01-02: qty 1

## 2021-01-02 MED ORDER — DEXAMETHASONE SODIUM PHOSPHATE 10 MG/ML IJ SOLN
INTRAMUSCULAR | Status: DC | PRN
  Administered 2021-01-02: 10 mg via INTRAVENOUS

## 2021-01-02 MED ORDER — SODIUM CHLORIDE 0.9 % IV SOLN: INTRAVENOUS | Status: DC

## 2021-01-02 MED ORDER — LACTATED RINGERS IV BOLUS: 250.0000 mL | Freq: Once | INTRAVENOUS | Status: DC

## 2021-01-02 MED ORDER — ONDANSETRON HCL 4 MG PO TABS: 4.0000 mg | ORAL_TABLET | Freq: Four times a day (QID) | ORAL | Status: DC | PRN

## 2021-01-02 MED ORDER — POLYETHYLENE GLYCOL 3350 17 G PO PACK: 17.0000 g | PACK | Freq: Two times a day (BID) | ORAL | 0 refills | Status: DC

## 2021-01-02 MED ORDER — PROPOFOL 10 MG/ML IV BOLUS
INTRAVENOUS | Status: DC | PRN
  Administered 2021-01-02: 40 mg via INTRAVENOUS

## 2021-01-02 MED ORDER — BUPIVACAINE IN DEXTROSE 0.75-8.25 % IT SOLN
INTRATHECAL | Status: DC | PRN
  Administered 2021-01-02: 1.8 mL via INTRATHECAL

## 2021-01-02 MED ORDER — ASPIRIN 81 MG PO CHEW: 81.0000 mg | CHEWABLE_TABLET | Freq: Two times a day (BID) | ORAL | 0 refills | Status: AC

## 2021-01-02 MED ORDER — DEXAMETHASONE SODIUM PHOSPHATE 10 MG/ML IJ SOLN: 10.0000 mg | Freq: Once | INTRAMUSCULAR | Status: DC

## 2021-01-02 MED ORDER — ONDANSETRON HCL 4 MG/2ML IJ SOLN
INTRAMUSCULAR | Status: DC | PRN
  Administered 2021-01-02: 4 mg via INTRAVENOUS

## 2021-01-02 MED ORDER — FERROUS SULFATE 325 (65 FE) MG PO TABS
325.0000 mg | ORAL_TABLET | Freq: Three times a day (TID) | ORAL | Status: DC
  Administered 2021-01-02 – 2021-01-03 (×3): 325 mg via ORAL
  Filled 2021-01-02 (×3): qty 1

## 2021-01-02 MED ORDER — MAGNESIUM CITRATE PO SOLN: 1.0000 | Freq: Once | ORAL | Status: DC | PRN

## 2021-01-02 MED ORDER — CELECOXIB 200 MG PO CAPS
200.0000 mg | ORAL_CAPSULE | Freq: Two times a day (BID) | ORAL | Status: DC
  Administered 2021-01-02 – 2021-01-03 (×2): 200 mg via ORAL
  Filled 2021-01-02 (×2): qty 1

## 2021-01-02 MED ORDER — LEVOTHYROXINE SODIUM 100 MCG PO TABS
100.0000 ug | ORAL_TABLET | Freq: Every day | ORAL | Status: DC
  Administered 2021-01-03: 100 ug via ORAL
  Filled 2021-01-02: qty 1

## 2021-01-02 MED ORDER — EPHEDRINE 5 MG/ML INJ
INTRAVENOUS | Status: AC
  Filled 2021-01-02: qty 10

## 2021-01-02 SURGICAL SUPPLY — 42 items
BAG DECANTER FOR FLEXI CONT (MISCELLANEOUS) IMPLANT
BAG ZIPLOCK 12X15 (MISCELLANEOUS) IMPLANT
BLADE SAG 18X100X1.27 (BLADE) ×2 IMPLANT
BLADE SURG SZ10 CARB STEEL (BLADE) ×4 IMPLANT
COVER PERINEAL POST (MISCELLANEOUS) ×2 IMPLANT
COVER SURGICAL LIGHT HANDLE (MISCELLANEOUS) ×2 IMPLANT
COVER WAND RF STERILE (DRAPES) IMPLANT
CUP ACETBLR 52 OD PINNACLE (Hips) ×2 IMPLANT
DERMABOND ADVANCED (GAUZE/BANDAGES/DRESSINGS) ×1
DERMABOND ADVANCED .7 DNX12 (GAUZE/BANDAGES/DRESSINGS) ×1 IMPLANT
DRAPE STERI IOBAN 125X83 (DRAPES) ×2 IMPLANT
DRAPE U-SHAPE 47X51 STRL (DRAPES) ×4 IMPLANT
DRESSING AQUACEL AG SP 3.5X10 (GAUZE/BANDAGES/DRESSINGS) ×1 IMPLANT
DRSG AQUACEL AG SP 3.5X10 (GAUZE/BANDAGES/DRESSINGS) ×2
DURAPREP 26ML APPLICATOR (WOUND CARE) ×2 IMPLANT
ELECT REM PT RETURN 15FT ADLT (MISCELLANEOUS) ×2 IMPLANT
ELIMINATOR HOLE APEX DEPUY (Hips) ×2 IMPLANT
GLOVE ECLIPSE 8.0 STRL XLNG CF (GLOVE) ×4 IMPLANT
GLOVE ORTHO TXT STRL SZ7.5 (GLOVE) ×4 IMPLANT
GLOVE SURG ENC MOIS LTX SZ6 (GLOVE) ×4 IMPLANT
GLOVE SURG UNDER POLY LF SZ6.5 (GLOVE) ×2 IMPLANT
GLOVE SURG UNDER POLY LF SZ7.5 (GLOVE) ×2 IMPLANT
GLOVE SURG UNDER POLY LF SZ8.5 (GLOVE) ×2 IMPLANT
GOWN STRL REUS W/TWL LRG LVL3 (GOWN DISPOSABLE) ×4 IMPLANT
GOWN STRL REUS W/TWL XL LVL3 (GOWN DISPOSABLE) ×2 IMPLANT
HEAD CERAMIC 36 PLUS5 (Hips) ×2 IMPLANT
HOLDER FOLEY CATH W/STRAP (MISCELLANEOUS) ×2 IMPLANT
KIT TURNOVER KIT A (KITS) ×2 IMPLANT
LINER NEUTRAL 52X36MM PLUS 4 (Liner) ×2 IMPLANT
PACK ANTERIOR HIP CUSTOM (KITS) ×2 IMPLANT
PENCIL SMOKE EVACUATOR (MISCELLANEOUS) ×2 IMPLANT
SCREW 6.5MMX30MM (Screw) ×2 IMPLANT
STEM FEMORAL SZ 5MM STD ACTIS (Stem) ×2 IMPLANT
SUT MNCRL AB 4-0 PS2 18 (SUTURE) ×2 IMPLANT
SUT STRATAFIX 0 PDS 27 VIOLET (SUTURE) ×2
SUT VIC AB 1 CT1 36 (SUTURE) ×6 IMPLANT
SUT VIC AB 2-0 CT1 27 (SUTURE) ×2
SUT VIC AB 2-0 CT1 TAPERPNT 27 (SUTURE) ×2 IMPLANT
SUTURE STRATFX 0 PDS 27 VIOLET (SUTURE) ×1 IMPLANT
TRAY FOLEY MTR SLVR 14FR STAT (SET/KITS/TRAYS/PACK) ×2 IMPLANT
TUBE SUCTION HIGH CAP CLEAR NV (SUCTIONS) ×2 IMPLANT
WATER STERILE IRR 1000ML POUR (IV SOLUTION) ×2 IMPLANT

## 2021-01-02 NOTE — Anesthesia Postprocedure Evaluation (Signed)
Anesthesia Post Note  Patient: [name] [name]  Procedure(s) Performed: TOTAL HIP ARTHROPLASTY ANTERIOR APPROACH (Right Hip)     Patient location during evaluation: PACU Anesthesia Type: Spinal Level of consciousness: oriented and awake Pain management: pain level controlled Vital Signs Assessment: post-procedure vital signs reviewed and stable Respiratory status: spontaneous breathing, respiratory function stable and patient connected to nasal cannula oxygen Cardiovascular status: blood pressure returned to baseline and stable Postop Assessment: no headache, no backache and no apparent nausea or vomiting Anesthetic complications: no   No complications documented.  Last Vitals:  Vitals:   01/02/21 1546 01/02/21 1649  BP: (!) 110/97 130/70  Pulse: (!) 50 (!) 51  Resp: 15 16  Temp: 36.6 C 36.5 C  SpO2: 100% 100%    Last Pain:  Vitals:   01/02/21 1649  TempSrc: Oral  PainSc:                  Leliana Kontz P Lavender Stanke

## 2021-01-02 NOTE — Anesthesia Preprocedure Evaluation (Addendum)
Anesthesia Evaluation  Patient identified by MRN, date of birth, ID band Patient awake    Reviewed: Allergy & Precautions, NPO status , Patient's Chart, lab work & pertinent test results  History of Anesthesia Complications (+) PONV and history of anesthetic complications  Airway Mallampati: IV  TM Distance: >3 FB Neck ROM: Limited    Dental no notable dental hx.    Pulmonary former smoker,    Pulmonary exam normal breath sounds clear to auscultation       Cardiovascular hypertension, Pt. on medications Normal cardiovascular exam Rhythm:Regular Rate:Normal     Neuro/Psych PSYCHIATRIC DISORDERS Depression    GI/Hepatic negative GI ROS, Neg liver ROS,   Endo/Other  Hypothyroidism   Renal/GU negative Renal ROS     Musculoskeletal  (+) Arthritis , Chronic back pain   Abdominal (+) + obese,   Peds  Hematology negative hematology ROS (+)   Anesthesia Other Findings Right hip osteoarthritis  Reproductive/Obstetrics                            Anesthesia Physical Anesthesia Plan  ASA: II  Anesthesia Plan: Spinal   Post-op Pain Management:    Induction:   PONV Risk Score and Plan: 2 and Ondansetron, Dexamethasone and Treatment may vary due to age or medical condition  Airway Management Planned: Simple Face Mask  Additional Equipment:   Intra-op Plan:   Post-operative Plan:   Informed Consent: I have reviewed the patients History and Physical, chart, labs and discussed the procedure including the risks, benefits and alternatives for the proposed anesthesia with the patient or authorized representative who has indicated his/her understanding and acceptance.     Dental advisory given  Plan Discussed with: CRNA  Anesthesia Plan Comments:         Anesthesia Quick Evaluation

## 2021-01-02 NOTE — Anesthesia Procedure Notes (Signed)
Spinal  Patient location during procedure: OR Start time: 01/02/2021 12:14 PM End time: 01/02/2021 12:16 PM Staffing Performed: resident/CRNA  Resident/CRNA: [place] Pearlene Teat C, CRNA Preanesthetic Checklist Completed: patient identified, IV checked, site marked, risks and benefits discussed, surgical consent, monitors and equipment checked, pre-op evaluation and timeout performed Spinal Block Patient position: sitting Prep: DuraPrep and site prepped and draped Patient monitoring: heart rate, cardiac monitor, continuous pulse ox and blood pressure Approach: midline Location: L3-4 Injection technique: single-shot Needle Needle type: Pencan  Needle gauge: 24 G Needle length: 9 cm Assessment Sensory level: T4 Additional Notes IV functioning, monitors applied to pt. Expiration date of kit checked and confirmed to be in date. Sterile prep and drape, hand hygiene and sterile gloved used. Pt was positioned and spine was prepped in sterile fashion. Skin was anesthetized with lidocaine. Free flow of clear CSF obtained prior to injecting local anesthetic into CSF x 1 attempt. Spinal needle aspirated freely following injection. Needle was carefully withdrawn, and pt tolerated procedure well. Loss of motor and sensory on exam post injection.

## 2021-01-02 NOTE — Transfer of Care (Signed)
Immediate Anesthesia Transfer of Care Note  Patient: [name] [name]  Procedure(s) Performed: TOTAL HIP ARTHROPLASTY ANTERIOR APPROACH (Right Hip)  Patient Location: PACU  Anesthesia Type:Spinal  Level of Consciousness: awake, alert  and oriented  Airway & Oxygen Therapy: Patient Spontanous Breathing and Patient connected to face mask oxygen  Post-op Assessment: Report given to RN and Post -op Vital signs reviewed and stable  Post vital signs: Reviewed and stable  Last Vitals:  Vitals Value Taken Time  BP 112/66 01/02/21 1345  Temp    Pulse 58 01/02/21 1346  Resp 15 01/02/21 1346  SpO2 100 % 01/02/21 1346  Vitals shown include unvalidated device data.  Last Pain:  Vitals:   01/02/21 1030  PainSc: 3       Patients Stated Pain Goal: 2 (32/35/57 3220)  Complications: No complications documented.

## 2021-01-02 NOTE — Interval H&P Note (Signed)
History and Physical Interval Note:  01/02/2021 11:13 AM  [name] [name]  has presented today for surgery, with the diagnosis of Right hip osteoarthritis.  The various methods of treatment have been discussed with the patient and family. After consideration of risks, benefits and other options for treatment, the patient has consented to  Procedure(s) with comments: TOTAL HIP ARTHROPLASTY ANTERIOR APPROACH (Right) - 70 mins as a surgical intervention.  The patient's history has been reviewed, patient examined, no change in status, stable for surgery.  I have reviewed the patient's chart and labs.  Questions were answered to the patient's satisfaction.     Mauri Pole

## 2021-01-02 NOTE — Op Note (Signed)
NAME:  [name]                ACCOUNT NO.: [id number]      MEDICAL RECORD NO.: [id number]      FACILITY:  [facility]      PHYSICIAN:  Mauri Pole  DATE OF BIRTH:  12-14-1945     DATE OF PROCEDURE:  01/02/2021                                 OPERATIVE REPORT         PREOPERATIVE DIAGNOSIS: Right  hip osteoarthritis.      POSTOPERATIVE DIAGNOSIS:  Right hip osteoarthritis.      PROCEDURE:  Right total hip replacement through an anterior approach   utilizing DePuy THR system, component size 52 mm pinnacle cup, a size 36+4 neutral   Altrex liner, a size 5 standard Actis stem with a 36+5 delta ceramic   ball.      SURGEON:  Pietro Cassis. Alvan Dame, M.D.      ASSISTANT:  Griffith Citron, PA-C     ANESTHESIA:  Spinal.      SPECIMENS:  None.      COMPLICATIONS:  None.      BLOOD LOSS:  250 cc     DRAINS:  None.      INDICATION OF THE PROCEDURE:  [name] [name] is a 75 y.o. female who had   presented to office for evaluation of right hip pain.  Radiographs revealed   progressive degenerative changes with bone-on-bone   articulation of the  hip joint, including subchondral cystic changes and osteophytes.  The patient had painful limited range of   motion significantly affecting their overall quality of life and function.  The patient was failing to    respond to conservative measures including medications and/or injections and activity modification and at this point was ready   to proceed with more definitive measures.  Consent was obtained for   benefit of pain relief.  Specific risks of infection, DVT, component   failure, dislocation, neurovascular injury, and need for revision surgery were reviewed in the office as well discussion of   the anterior versus posterior approach were reviewed.     PROCEDURE IN DETAIL:  The patient was brought to operative theater.   Once adequate anesthesia, preoperative antibiotics, 2 gm of Ancef, 1 gm of Tranexamic Acid, and 10  mg of Decadron were administered, the patient was positioned supine on the [employer] table.  Once the patient was safely positioned with adequate padding of boney prominences we predraped out the hip, and used fluoroscopy to confirm orientation of the pelvis.      The right hip was then prepped and draped from proximal iliac crest to   mid thigh with a shower curtain technique.      Time-out was performed identifying the patient, planned procedure, and the appropriate extremity.     An incision was then made 2 cm lateral to the   anterior superior iliac spine extending over the orientation of the   tensor fascia lata muscle and sharp dissection was carried down to the   fascia of the muscle.      The fascia was then incised.  The muscle belly was identified and swept   laterally and retractor placed along the superior neck.  Following   cauterization of the circumflex vessels and removing some pericapsular  fat, a second cobra retractor was placed on the inferior neck.  A T-capsulotomy was made along the line of the   superior neck to the trochanteric fossa, then extended proximally and   distally.  Tag sutures were placed and the retractors were then placed   intracapsular.  We then identified the trochanteric fossa and   orientation of my neck cut and then made a neck osteotomy with the femur on traction.  The femoral   head was removed without difficulty or complication.  Traction was let   off and retractors were placed posterior and anterior around the   acetabulum.      The labrum and foveal tissue were debrided.  I began reaming with a 44 mm   reamer and reamed up to 51 mm reamer with good bony bed preparation and a 52 mm  cup was chosen.  The final 52 mm Pinnacle cup was then impacted under fluoroscopy to confirm the depth of penetration and orientation with respect to   Abduction and forward flexion.  A screw was placed into the ilium followed by the hole eliminator.  The final    36+4 neutral Altrex liner was impacted with good visualized rim fit.  The cup was positioned anatomically within the acetabular portion of the pelvis.      At this point, the femur was rolled to 100 degrees.  Further capsule was   released off the inferior aspect of the femoral neck.  I then   released the superior capsule proximally.  With the leg in a neutral position the hook was placed laterally   along the femur under the vastus lateralis origin and elevated manually and then held in position using the hook attachment on the bed.  The leg was then extended and adducted with the leg rolled to 100   degrees of external rotation.  Retractors were placed along the medial calcar and posteriorly over the greater trochanter.  Once the proximal femur was fully   exposed, I used a box osteotome to set orientation.  I then began   broaching with the starting chili pepper broach and passed this by hand and then broached up to 5.  With the 5 broach in place I chose a standard offset neck and did several trial reductions.  The offset was appropriate, leg lengths   appeared to be equal best matched with the +5 head ball trial confirmed radiographically.   Given these findings, I went ahead and dislocated the hip, repositioned all   retractors and positioned the right hip in the extended and abducted position.  The final 5 standard Actis stem was   chosen and it was impacted down to the level of neck cut.  Based on this   and the trial reductions, a final 36+5 delta ceramic ball was chosen and   impacted onto a clean and dry trunnion, and the hip was reduced.  The   hip had been irrigated throughout the case again at this point.  I did   reapproximate the superior capsular leaflet to the anterior leaflet   using #1 Vicryl.  The fascia of the   tensor fascia lata muscle was then reapproximated using #1 Vicryl and #0 Stratafix sutures.  The   remaining wound was closed with 2-0 Vicryl and running 4-0  Monocryl.   The hip was cleaned, dried, and dressed sterilely using Dermabond and   Aquacel dressing.  The patient was then brought   to recovery room in  stable condition tolerating the procedure well.    Griffith Citron, PA-C was present for the entirety of the case involved from   preoperative positioning, perioperative retractor management, general   facilitation of the case, as well as primary wound closure as assistant.            Pietro Cassis Alvan Dame, M.D.        01/02/2021 11:25 AM

## 2021-01-03 ENCOUNTER — Encounter (HOSPITAL_COMMUNITY): Payer: Self-pay | Admitting: Orthopedic Surgery

## 2021-01-03 DIAGNOSIS — M1611 Unilateral primary osteoarthritis, right hip: Secondary | ICD-10-CM | POA: Diagnosis not present

## 2021-01-03 LAB — BASIC METABOLIC PANEL
Anion gap: 11 (ref 5–15)
BUN: 13 mg/dL (ref 8–23)
CO2: 21 mmol/L — ABNORMAL LOW (ref 22–32)
Calcium: 8.3 mg/dL — ABNORMAL LOW (ref 8.9–10.3)
Chloride: 101 mmol/L (ref 98–111)
Creatinine, Ser: 0.66 mg/dL (ref 0.44–1.00)
GFR, Estimated: >60 mL/min (ref >60)
Glucose, Bld: 151 mg/dL — ABNORMAL HIGH (ref 70–99)
Potassium: 3.8 mmol/L (ref 3.5–5.1)
Sodium: 133 mmol/L — ABNORMAL LOW (ref 135–145)

## 2021-01-03 LAB — CBC
HCT: 33.2 % — ABNORMAL LOW (ref 36.0–46.0)
Hemoglobin: 10.9 g/dL — ABNORMAL LOW (ref 12.0–15.0)
MCH: 31.3 pg (ref 26.0–34.0)
MCHC: 32.8 g/dL (ref 30.0–36.0)
MCV: 95.4 fL (ref 80.0–100.0)
Platelets: 201 10*3/uL (ref 150–400)
RBC: 3.48 MIL/uL — ABNORMAL LOW (ref 3.87–5.11)
RDW: 12.2 % (ref 11.5–15.5)
WBC: 10.2 10*3/uL (ref 4.0–10.5)
nRBC: 0 % (ref 0.0–0.2)

## 2021-01-03 NOTE — Evaluation (Signed)
Physical Therapy Evaluation Patient Details Name: [name] [name] MRN: [id number] DOB: 11-08-1946 Today's Date: 01/03/2021   History of Present Illness  Pt is a 75 year old female s/p R THA direct anterior approach  Clinical Impression  Pt is s/p THA resulting in the deficits listed below (see PT Problem List).  Pt will benefit from skilled PT to increase their independence and safety with mobility to allow discharge to the venue listed below.  Pt assisted with ambulating in hallway and then to bathroom.  Pt with many questions and answered within scope of practice.  Will return to educate pt on HEP prior to d/c.    Follow Up Recommendations Follow surgeon's recommendation for DC plan and follow-up therapies (plan for HEP)    Equipment Recommendations  3in1 (PT);Rolling walker with 5" wheels    Recommendations for Other Services       Precautions / Restrictions Precautions Precautions: Fall Restrictions Weight Bearing Restrictions: No      Mobility  Bed Mobility Overal bed mobility: Needs Assistance Bed Mobility: Supine to Sit     Supine to sit: Supervision;HOB elevated     General bed mobility comments: cues for technique    Transfers Overall transfer level: Needs assistance Equipment used: Rolling walker (2 wheeled) Transfers: Sit to/from Stand Sit to Stand: Min guard         General transfer comment: verbal cues for UE and LE positioning, min/guard for safety  Ambulation/Gait Ambulation/Gait assistance: Min guard Gait Distance (Feet): 100 Feet Assistive device: Rolling walker (2 wheeled) Gait Pattern/deviations: Step-to pattern;Decreased stance time - right;Antalgic     General Gait Details: verbal cues for sequence, RW positioning, posture, slow but steady gait  Stairs            Wheelchair Mobility    Modified Rankin (Stroke Patients Only)       Balance                                             Pertinent Vitals/Pain  Pain Assessment: 0-10 Pain Score: 4  Pain Location: incision Pain Descriptors / Indicators: Burning;Sore Pain Intervention(s): Repositioned;Monitored during session;Premedicated before session    [place] expects to be discharged to:: Private residence Living Arrangements: Alone Available Help at Discharge: Family;Available 24 hours/day (daughter to stay for a week after d/c) Type of Home: House Home Access: Stairs to enter Entrance Stairs-Rails: None Entrance Stairs-Number of Steps: 1 Home Layout: One level Home Equipment: Cane - quad      Prior Function Level of Independence: Independent               Hand Dominance        Extremity/Trunk Assessment        Lower Extremity Assessment Lower Extremity Assessment: RLE deficits/detail RLE Deficits / Details: anticipated right hip post op weakness, grossly 2+/5 hip       Communication   Communication: No difficulties  Cognition Arousal/Alertness: Awake/alert Behavior During Therapy: WFL for tasks assessed/performed Overall Cognitive Status: Within Functional Limits for tasks assessed                                        General Comments      Exercises     Assessment/Plan    PT Assessment Patient  needs continued PT services  PT Problem List Decreased strength;Decreased activity tolerance;Decreased mobility;Decreased knowledge of use of DME;Pain       PT Treatment Interventions Stair training;Gait training;DME instruction;Therapeutic exercise;Balance training;Functional mobility training;Patient/family education;Therapeutic activities    PT Goals (Current goals can be found in the Care Plan section)  Acute Rehab PT Goals PT Goal Formulation: With patient Time For Goal Achievement: 01/06/21 Potential to Achieve Goals: Good    Frequency 7X/week   Barriers to discharge        Co-evaluation               AM-PAC PT "6 Clicks" Mobility  Outcome Measure Help  needed turning from your back to your side while in a flat bed without using bedrails?: A Little Help needed moving from lying on your back to sitting on the side of a flat bed without using bedrails?: A Little Help needed moving to and from a bed to a chair (including a wheelchair)?: A Little Help needed standing up from a chair using your arms (e.g., wheelchair or bedside chair)?: A Little Help needed to walk in hospital room?: A Little Help needed climbing 3-5 steps with a railing? : A Little 6 Click Score: 18    End of Session Equipment Utilized During Treatment: Gait belt Activity Tolerance: Patient tolerated treatment well Patient left: in chair;with call bell/phone within reach Nurse Communication: Mobility status PT Visit Diagnosis: Difficulty in walking, not elsewhere classified (R26.2)    Time: 9038-3338 PT Time Calculation (min) (ACUTE ONLY): 24 min   Charges:   PT Evaluation $PT Eval Low Complexity: 1 Low PT Treatments $Gait Training: 8-22 mins      Jannette Spanner PT, DPT Acute Rehabilitation Services Pager: [phone] Office: [phone]  York Ram E 01/03/2021, 12:22 PM

## 2021-01-03 NOTE — Progress Notes (Signed)
Physical Therapy Treatment Patient Details Name: [name] [name] MRN: [id number] DOB: 1946/06/11 Today's Date: 01/03/2021    History of Present Illness Pt is a 75 year old female s/p R THA direct anterior approach    PT Comments    Pt performed HEP exercises while reviewing handout.  Pt also ambulated in hallway again.  Pt feels ready for d/c home today.  Daughter arrived to room at end of session.    Follow Up Recommendations  Follow surgeon's recommendation for DC plan and follow-up therapies     Equipment Recommendations  3in1 (PT);Rolling walker with 5" wheels    Recommendations for Other Services       Precautions / Restrictions Precautions Precautions: Fall Restrictions Weight Bearing Restrictions: No    Mobility  Bed Mobility Overal bed mobility: Needs Assistance Bed Mobility: Supine to Sit     Supine to sit: Supervision;HOB elevated     General bed mobility comments: pt up in recliner  Transfers Overall transfer level: Needs assistance Equipment used: Rolling walker (2 wheeled) Transfers: Sit to/from Stand Sit to Stand: Min guard;Supervision         General transfer comment: verbal cues for UE and LE positioning  Ambulation/Gait Ambulation/Gait assistance: Min guard;Supervision Gait Distance (Feet): 200 Feet Assistive device: Rolling walker (2 wheeled) Gait Pattern/deviations: Step-to pattern;Decreased stance time - right;Antalgic     General Gait Details: verbal cues for sequence, RW positioning, posture, slow but steady gait   Stairs             Wheelchair Mobility    Modified Rankin (Stroke Patients Only)       Balance                                            Cognition Arousal/Alertness: Awake/alert Behavior During Therapy: WFL for tasks assessed/performed Overall Cognitive Status: Within Functional Limits for tasks assessed                                        Exercises Total Joint  Exercises Ankle Circles/Pumps: AROM;Both;10 reps Quad Sets: AROM;Both;10 reps Short Arc Quad: AROM;Right;10 reps Heel Slides: AAROM;Right;10 reps Hip ABduction/ADduction: AROM;Right;10 reps;Supine;Standing Long Arc Quad: AROM;Right;Seated;10 reps Knee Flexion: AROM;Right;Standing;10 reps Marching in Standing: AROM;Right;10 reps;Standing Standing Hip Extension: AROM;Right;Standing;10 reps    General Comments        Pertinent Vitals/Pain Pain Assessment: 0-10 Pain Score: 4  Pain Location: incision Pain Descriptors / Indicators: Burning;Sore Pain Intervention(s): Repositioned;Monitored during session    Home Living Family/patient expects to be discharged to:: Private residence Living Arrangements: Alone Available Help at Discharge: Family;Available 24 hours/day (daughter to stay for a week after d/c) Type of Home: House Home Access: Stairs to enter Entrance Stairs-Rails: None Home Layout: One level Home Equipment: Cane - quad      Prior Function Level of Independence: Independent          PT Goals (current goals can now be found in the care plan section) Acute Rehab PT Goals PT Goal Formulation: With patient Time For Goal Achievement: 01/06/21 Potential to Achieve Goals: Good Progress towards PT goals: Progressing toward goals    Frequency    7X/week      PT Plan Current plan remains appropriate    Co-evaluation  AM-PAC PT "6 Clicks" Mobility   Outcome Measure  Help needed turning from your back to your side while in a flat bed without using bedrails?: A Little Help needed moving from lying on your back to sitting on the side of a flat bed without using bedrails?: A Little Help needed moving to and from a bed to a chair (including a wheelchair)?: A Little Help needed standing up from a chair using your arms (e.g., wheelchair or bedside chair)?: A Little Help needed to walk in hospital room?: A Little Help needed climbing 3-5 steps with a  railing? : A Little 6 Click Score: 18    End of Session Equipment Utilized During Treatment: Gait belt Activity Tolerance: Patient tolerated treatment well Patient left: in chair;with call bell/phone within reach;with family/visitor present Nurse Communication: Mobility status PT Visit Diagnosis: Difficulty in walking, not elsewhere classified (R26.2)     Time: 0340-3524 PT Time Calculation (min) (ACUTE ONLY): 33 min  Charges:  $Gait Training: 8-22 mins $Therapeutic Exercise: 8-22 mins                     Jannette Spanner PT, DPT Acute Rehabilitation Services Pager: [phone] Office: [phone]  York Ram E 01/03/2021, 12:28 PM

## 2021-01-03 NOTE — Progress Notes (Signed)
     Subjective: 1 Day Post-Op Procedure(s) (LRB): TOTAL HIP ARTHROPLASTY ANTERIOR APPROACH (Right)   Patient reports pain as mild, pain controlled with medication.  No reported events throughout the night.  Discussed the procedure and expectations moving forward.  Ready to be discharged home, if they do well with PT.  Follow up in the clinic in 2 weeks.  Knows to call with any questions or concerns.       Objective:   VITALS:   Vitals:   01/03/21 0624 01/03/21 1021  BP: (!) 119/59 105/64  Pulse: (!) 52 (!) 48  Resp: 15 16  Temp: 97.7 F (36.5 C) 98.8 F (37.1 C)  SpO2: 100% 100%    Dorsiflexion/Plantar flexion intact Incision: dressing C/D/I No cellulitis present Compartment soft  LABS Recent Labs    01/03/21 0319  HGB 10.9*  HCT 33.2*  WBC 10.2  PLT 201    Recent Labs    01/03/21 0319  NA 133*  K 3.8  BUN 13  CREATININE 0.66  GLUCOSE 151*     Assessment/Plan: 1 Day Post-Op Procedure(s) (LRB): TOTAL HIP ARTHROPLASTY ANTERIOR APPROACH (Right) Foley cath d/c'ed Advance diet Up with therapy D/C IV fluids Discharge home  Follow up in 2 weeks at [facility] Follow up with OLIN,Elfreida Heggs D in 2 weeks.  Contact information:  [facility] [address] [place] [phone]             Danae Orleans PA-C  [facility]  [place] [address], [place], [place], [place] [address] Phone: [phone] [email or web address] Facebook  [employer]

## 2021-01-03 NOTE — Discharge Instructions (Signed)

## 2021-01-03 NOTE — TOC Transition Note (Signed)
Transition of Care [facility]) - CM/SW Discharge Note   Patient Details  Name: [name] [name] MRN: [id number] Date of Birth: 23-Feb-1946  Transition [facility]) CM/SW Contact:  Lennart Pall, LCSW Phone Number: 01/03/2021, 9:28 AM   Clinical Narrative:    Met briefly with pt to review dc needs/ plans.  Pt to discharge home and daughter plans to stay with her x one week.  Plan for HEP.  Confirmed she does need rw and 3n1 commode - orders placed with [employer].  No further TOC needs.   Final next level of care: Home/Self Care Barriers to Discharge: No Barriers Identified   Patient Goals and CMS Choice Patient states their goals for this hospitalization and ongoing recovery are:: go home      Discharge Placement                       Discharge Plan and Services                DME Arranged: 3-N-1,Walker rolling DME Agency: [employer] Date DME Agency Contacted: 01/03/21 Time DME Agency Contacted: [phone] Representative spoke with at DME Agency: [place] ([facility]) Interventions     Readmission Risk Interventions No flowsheet data found.

## 2021-01-03 NOTE — Care Management Important Message (Signed)
Important Message  Patient Details IM Letter given to the Patient. Name: [name] [name] MRN: [id number] Date of Birth: 02-06-1946   Medicare Important Message Given:  Yes     Kerin Salen 01/03/2021, 10:30 AM

## 2021-01-09 NOTE — Discharge Summary (Signed)
Patient ID: [name] [name] MRN: [id number] DOB/AGE: [birth year] 74 y.o.  Admit date: 01/02/2021 Discharge date: 01/03/2021  Admission Diagnoses:  Principal Problem:   Osteoarthritis of right hip Active Problems:   Status post right hip replacement   Discharge Diagnoses:  Same  Past Medical History:  Diagnosis Date  . Abnormal Pap smear   . Abnormal peripheral vision    RT EYE  . Anemia   . Arthritis    back and neck  . Breast cancer ([place])    right breast  . Bruises easily   . Cataracts, bilateral   . Chronic back pain   . Chronic neck pain   . Complication of anesthesia    slow to wake up  . Family history of adverse reaction to anesthesia    Dad takes a long time to wake up  . Frequency of urination   . GERD (gastroesophageal reflux disease)   . Goiter   . High cholesterol   . History of postoperative nausea and vomiting   . Hypertension   . Hypothyroidism   . Numbness    hands / feet /arms / legs - followed by Dr. Georgann Housekeeper - [place]  . PONV (postoperative nausea and vomiting)   . Post-menopausal   . Sexual dysfunction   . Shortness of breath    with exertion and Wt gain  . Urinary incontinence     Surgeries: Procedure(s): RIGHT TOTAL HIP ARTHROPLASTY ANTERIOR APPROACH on 01/02/2021   Consultants: N/A  Discharged Condition: Improved  Hospital Course: [name] [name] is an 76 y.o. female who was admitted 01/02/2021 for operative treatment of Osteoarthritis of right hip. Patient has severe unremitting pain that affects sleep, daily activities, and work/hobbies. After pre-op clearance the patient was taken to the operating room on 01/02/2021 and underwent  Procedure(s):  RIGHT TOTAL HIP ARTHROPLASTY ANTERIOR APPROACH.    Patient was given perioperative antibiotics:  Anti-infectives (From admission, onward)   Start     Dose/Rate Route Frequency Ordered Stop   01/02/21 1630  ceFAZolin (ANCEF) IVPB 2g/100 mL premix        2 g 200 mL/hr over 30 Minutes  Intravenous Every 6 hours 01/02/21 1542 01/02/21 2138   01/02/21 1000  ceFAZolin (ANCEF) IVPB 2g/100 mL premix        2 g 200 mL/hr over 30 Minutes Intravenous On call to O.R. 01/02/21 0959 01/02/21 1219       Patient was given sequential compression devices, early ambulation, and chemoprophylaxis to prevent DVT.  Patient benefited maximally from hospital stay and there were no complications.    Recent vital signs: No data found.   Recent laboratory studies: No results for input(s): WBC, HGB, HCT, PLT, NA, K, CL, CO2, BUN, CREATININE, GLUCOSE, INR, CALCIUM in the last 72 hours.  Invalid input(s): PT, 2   Discharge Medications:   Allergies as of 01/03/2021      Reactions   Erythromycin Nausea Only   Other reaction(s): Unknown   Ezetimibe    Other reaction(s): myalgias   Other Itching   Unknown Dye PET scan dye possibly= itching   Statins    Other reaction(s): myalgias      Medication List    STOP taking these medications   acetaminophen 500 MG tablet Commonly known as: TYLENOL   aspirin 81 MG tablet Replaced by: aspirin 81 MG chewable tablet     TAKE these medications   aspirin 81 MG chewable tablet Commonly known as: Aspirin Childrens Chew 1 tablet (  81 mg total) by mouth 2 (two) times daily. Take for 4 weeks, then resume regular dose. Replaces: aspirin 81 MG tablet   BIOTIN PO Take 1 capsule by mouth daily.   cholecalciferol 1000 units tablet Commonly known as: VITAMIN D Take 2,000 Units by mouth daily.   docusate sodium 100 MG capsule Commonly known as: Colace Take 1 capsule (100 mg total) by mouth 2 (two) times daily.   dorzolamide-timolol 22.3-6.8 MG/ML ophthalmic solution Commonly known as: COSOPT Place 1 drop into both eyes 2 (two) times daily.   ferrous sulfate 325 (65 FE) MG tablet Commonly known as: FerrouSul Take 1 tablet (325 mg total) by mouth 3 (three) times daily with meals for 14 days.   fish oil-omega-3 fatty acids 1000 MG capsule Take 2  g by mouth daily.   gabapentin 300 MG capsule Commonly known as: NEURONTIN Take 1 capsule (300 mg total) by mouth 3 (three) times daily. What changed:   how much to take  additional instructions   HYDROcodone-acetaminophen 7.5-325 MG tablet Commonly known as: Norco Take 1-2 tablets by mouth every 4 (four) hours as needed for moderate pain.   irbesartan 300 MG tablet Commonly known as: AVAPRO Take 300 mg by mouth daily.   latanoprost 0.005 % ophthalmic solution Commonly known as: XALATAN Place 1 drop into the left eye at bedtime.   levothyroxine 100 MCG tablet Commonly known as: SYNTHROID Take 100 mcg by mouth daily.   LUTEIN 20 PO Take 1 tablet by mouth daily. Notes to patient: Home regimen   methocarbamol 500 MG tablet Commonly known as: Robaxin Take 1 tablet (500 mg total) by mouth every 6 (six) hours as needed for muscle spasms.   Multivitamin Adult Tabs Take 1 tablet by mouth daily.   polyethylene glycol 17 g packet Commonly known as: MIRALAX / GLYCOLAX Take 17 g by mouth 2 (two) times daily.   polyvinyl alcohol 1.4 % ophthalmic solution Commonly known as: LIQUIFILM TEARS Place 1 drop into both eyes daily as needed (for dry eyes).   VITAMIN B 12 PO Take 1 tablet by mouth daily.            Discharge Care Instructions  (From admission, onward)         Start     Ordered   01/03/21 0000  Change dressing       Comments: Maintain surgical dressing until follow up in the clinic. If the edges start to pull up, may reinforce with tape. If the dressing is no longer working, may remove and cover with gauze and tape, but must keep the area dry and clean.  Call with any questions or concerns.   01/03/21 1248          Diagnostic Studies: DG Pelvis Portable  Result Date: 01/02/2021 CLINICAL DATA:  Status post right hip replacement EXAM: PORTABLE PELVIS 1-2 VIEWS COMPARISON:  None. FINDINGS: Right hip prosthesis is noted in satisfactory position. No acute bony  or soft tissue abnormality is noted. IMPRESSION: Status post right hip replacement. Electronically Signed   By: Inez Catalina M.D.   On: 01/02/2021 15:17   DG C-Arm 1-60 Min-No Report  Result Date: 01/02/2021 Fluoroscopy was utilized by the requesting physician.  No radiographic interpretation.   DG HIP OPERATIVE UNILAT W OR W/O PELVIS RIGHT  Result Date: 01/02/2021 CLINICAL DATA:  Right-sided hip replacement EXAM: OPERATIVE RIGHT HIP WITH PELVIS COMPARISON:  None. FLUOROSCOPY TIME:  Radiation Exposure Index (as provided by the fluoroscopic device): 1.8 mGy  If the device does not provide the exposure index: Fluoroscopy Time:  12 seconds Number of Acquired Images:  2 FINDINGS: Right hip acetabular component is noted in place. Spacer is seen in the proximal femur. IMPRESSION: Images from recent hip replacement on the right. Electronically Signed   By: Inez Catalina M.D.   On: 01/02/2021 15:16    Disposition: Home  Discharge Instructions    Call MD / Call 911   Complete by: As directed    If you experience chest pain or shortness of breath, CALL 911 and be transported to the hospital emergency room.  If you develope a fever above 101 F, pus (white drainage) or increased drainage or redness at the wound, or calf pain, call your surgeon's office.   Change dressing   Complete by: As directed    Maintain surgical dressing until follow up in the clinic. If the edges start to pull up, may reinforce with tape. If the dressing is no longer working, may remove and cover with gauze and tape, but must keep the area dry and clean.  Call with any questions or concerns.   Constipation Prevention   Complete by: As directed    Drink plenty of fluids.  Prune juice may be helpful.  You may use a stool softener, such as Colace (over the counter) 100 mg twice a day.  Use MiraLax (over the counter) for constipation as needed.   Diet - low sodium heart healthy   Complete by: As directed    Discharge instructions    Complete by: As directed    Maintain surgical dressing until follow up in the clinic. If the edges start to pull up, may reinforce with tape. If the dressing is no longer working, may remove and cover with gauze and tape, but must keep the area dry and clean.  Follow up in 2 weeks at [facility]. Call with any questions or concerns.   Increase activity slowly as tolerated   Complete by: As directed    Weight bearing as tolerated with assist device (walker, cane, etc) as directed, use it as long as suggested by your surgeon or therapist, typically at least 4-6 weeks.   TED hose   Complete by: As directed    Use stockings (TED hose) for 2 weeks on both leg(s).  You may remove them at night for sleeping.         Signed: Lucille Passy [facility] 01/09/2021, 11:34 AM

## 2021-03-29 LAB — HM MAMMOGRAPHY: HM Mammogram: NORMAL (ref 0–4)

## 2021-09-14 ENCOUNTER — Encounter: Payer: Self-pay | Admitting: Gastroenterology

## 2021-10-16 ENCOUNTER — Telehealth: Payer: Self-pay | Admitting: *Deleted

## 2021-10-16 NOTE — Telephone Encounter (Signed)
John,  While prepping this pt's chart for PV, noted, in 2013, a glidescope was used for intubation.  I do not note any issues with intubation, but just wanted to make sure she is ok for [place].  Please advise.  Thanks, [employer]

## 2021-10-17 NOTE — Telephone Encounter (Signed)
noted 

## 2021-10-24 ENCOUNTER — Other Ambulatory Visit: Payer: Self-pay

## 2021-10-24 ENCOUNTER — Encounter: Payer: Self-pay | Admitting: Cardiovascular Disease

## 2021-10-24 ENCOUNTER — Ambulatory Visit (INDEPENDENT_AMBULATORY_CARE_PROVIDER_SITE_OTHER): Payer: Medicare HMO | Admitting: Cardiovascular Disease

## 2021-10-24 VITALS — BP 122/72 | HR 52 | Ht 63.0 in | Wt 176.2 lb

## 2021-10-24 DIAGNOSIS — E785 Hyperlipidemia, unspecified: Secondary | ICD-10-CM

## 2021-10-24 DIAGNOSIS — I10 Essential (primary) hypertension: Secondary | ICD-10-CM | POA: Diagnosis not present

## 2021-10-24 DIAGNOSIS — E039 Hypothyroidism, unspecified: Secondary | ICD-10-CM

## 2021-10-24 DIAGNOSIS — E7801 Familial hypercholesterolemia: Secondary | ICD-10-CM

## 2021-10-24 NOTE — Patient Instructions (Signed)
Medication Instructions:  No changes *If you need a refill on your cardiac medications before your next appointment, please call your pharmacy*   Lab Work: None ordered If you have labs (blood work) drawn today and your tests are completely normal, you will receive your results only by: MyChart Message (if you have MyChart) OR A paper copy in the mail If you have any lab test that is abnormal or we need to change your treatment, we will call you to review the results.   Testing/Procedures: Dr. Croitoru has ordered a CT coronary calcium score. This test is done at [address]. This is $99 out of pocket.   Coronary CalciumScan A coronary calcium scan is an imaging test used to look for deposits of calcium and other fatty materials (plaques) in the inner lining of the blood vessels of the heart (coronary arteries). These deposits of calcium and plaques can partly clog and narrow the coronary arteries without producing any symptoms or warning signs. This puts a person at risk for a heart attack. This test can detect these deposits before symptoms develop. Tell a health care provider about: Any allergies you have. All medicines you are taking, including vitamins, herbs, eye drops, creams, and over-the-counter medicines. Any problems you or family members have had with anesthetic medicines. Any blood disorders you have. Any surgeries you have had. Any medical conditions you have. Whether you are pregnant or may be pregnant. What are the risks? Generally, this is a safe procedure. However, problems may occur, including: Harm to a pregnant woman and her unborn baby. This test involves the use of radiation. Radiation exposure can be dangerous to a pregnant woman and her unborn baby. If you are pregnant, you generally should not have this procedure done. Slight increase in the risk of cancer. This is because of the radiation involved in the test. What happens before the  procedure? No preparation is needed for this procedure. What happens during the procedure? You will undress and remove any jewelry around your neck or chest. You will put on a hospital gown. Sticky electrodes will be placed on your chest. The electrodes will be connected to an electrocardiogram (ECG) machine to record a tracing of the electrical activity of your heart. A CT scanner will take pictures of your heart. During this time, you will be asked to lie still and hold your breath for 2-3 seconds while a picture of your heart is being taken. The procedure may vary among health care providers and hospitals. What happens after the procedure? You can get dressed. You can return to your normal activities. It is up to you to get the results of your test. Ask your health care provider, or the department that is doing the test, when your results will be ready. Summary A coronary calcium scan is an imaging test used to look for deposits of calcium and other fatty materials (plaques) in the inner lining of the blood vessels of the heart (coronary arteries). Generally, this is a safe procedure. Tell your health care provider if you are pregnant or may be pregnant. No preparation is needed for this procedure. A CT scanner will take pictures of your heart. You can return to your normal activities after the scan is done. This information is not intended to replace advice given to you by your health care provider. Make sure you discuss any questions you have with your health care provider. Document Released: 05/09/2008 Document Revised: 09/30/2016 Document Reviewed: 09/30/2016   Elsevier Interactive Patient Education  2017 [place]: At [facility], you and your health needs are our priority.  As part of our continuing mission to provide you with exceptional heart care, we have created designated Provider Care Teams.  These Care Teams include your primary Cardiologist (physician) and  Advanced Practice Providers (APPs -  Physician Assistants and Nurse Practitioners) who all work together to provide you with the care you need, when you need it.  We recommend signing up for the patient portal called "MyChart".  Sign up information is provided on this After Visit Summary.  MyChart is used to connect with patients for Virtual Visits (Telemedicine).  Patients are able to view lab/test results, encounter notes, upcoming appointments, etc.  Non-urgent messages can be sent to your provider as well.   To learn more about what you can do with MyChart, go to [email or web address].    Your next appointment:   12 month(s)  The format for your next appointment:   In Person  Provider:   Sanda Klein, MD

## 2021-10-28 ENCOUNTER — Encounter: Payer: Self-pay | Admitting: Cardiovascular Disease

## 2021-10-28 DIAGNOSIS — E7801 Familial hypercholesterolemia: Secondary | ICD-10-CM | POA: Insufficient documentation

## 2021-10-28 NOTE — Progress Notes (Signed)
Cardiology Office Note:    Date:  10/28/2021   ID:  [name] [name], DOB 1945-12-26, MRN [id number]  PCP:  Aretta Nip, MD   [facility] Providers Cardiologist:  Sanda Klein, MD     Referring MD: Aretta Nip, MD   No chief complaint on file. [name] [name] is a 75 y.o. female who is being seen today for the evaluation of hypercholesterolemia at the request of Rankins, Bill Salinas, MD.   History of Present Illness:    [name] [name] is a 75 y.o. female with a hx of essential hypertension, acquired hypothyroidism and mixed hyperlipidemia, that has not tolerated treatment with statins so far.  She does not have known CAD or PAD.  I have been unable to find any imaging studies that could help risk stratify her.  She has tried treatment with atorvastatin and simvastatin and did not tolerate either 1 due to muscle side effects.  She has not tried pravastatin or rosuvastatin to date.  Although there is one family member with early onset CAD (her brother had a myocardial infarction in his 41s), most of her other family members have been healthy.  Her mother had atrial fibrillation and died of unrelated causes at age 35.  Her father died at age 46.  She has a history of had atrial fibrillation and strokes, but was a smoker and lived to age 29.  She does report that on her mother side of the family, many family members died in their 80s, but she does not know the details.  She's known of hypercholesterolemia for many years.  Her most recent labs show a total cholesterol 312, LDL 209 and HDL of 51.  Triglycerides are also mildly high at 259.  She does not have diabetes mellitus.  Most recent hemoglobin A1c was 5.4%.  She has tried to eat a healthy diet, but does not exercise regularly.  She gained a lot of weight after undergoing thyroidectomy for hyperthyroidism several years ago.  She is currently on levothyroxine supplementation and her dose of hormone supplement was  recently decreased due to a depressed TSH of 0.107.  Activity has been limited by orthopedic problems including right total hip replacement and cervical spine surgery. Past Medical History:  Diagnosis Date   Abnormal Pap smear    Abnormal peripheral vision    RT EYE   Anemia    Arthritis    back and neck   Breast cancer (HCC)    right breast   Bruises easily    Cataracts, bilateral    Chronic back pain    Chronic neck pain    Complication of anesthesia    slow to wake up   Family history of adverse reaction to anesthesia    Dad takes a long time to wake up   Frequency of urination    GERD (gastroesophageal reflux disease)    Goiter    High cholesterol    History of postoperative nausea and vomiting    Hypertension    Hypothyroidism    Numbness    hands / feet /arms / legs - followed by Dr. Georgann Housekeeper - [place]   PONV (postoperative nausea and vomiting)    Post-menopausal    Sexual dysfunction    Shortness of breath    with exertion and Wt gain   Urinary incontinence     Past Surgical History:  Procedure Laterality Date   BIOPSY THYROID     BREAST SURGERY Right  lymph node removed   CATARACT EXTRACTION, BILATERAL  2009,2010   EYE SURGERY  08/2012   detached retina surg   LAPAROSCOPY  1978   BTL   lumpectomy  1997   Rt   neck fusion  2010   THYROIDECTOMY N/A 05/04/2013   Procedure:  TOTAL THYROIDECTOMY;  Surgeon: Earnstine Regal, MD;  Location: [facility];  Service: General;  Laterality: N/A;   TOTAL HIP ARTHROPLASTY Right 01/02/2021   Procedure: TOTAL HIP ARTHROPLASTY ANTERIOR APPROACH;  Surgeon: Paralee Cancel, MD;  Location: [facility];  Service: Orthopedics;  Laterality: Right;  70 mins    Current Medications: Current Meds  Medication Sig   BIOTIN PO Take 1 capsule by mouth daily.   cholecalciferol (VITAMIN D) 1000 UNITS tablet Take 2,000 Units by mouth daily.   Cyanocobalamin (VITAMIN B 12 PO) Take 1 tablet by mouth daily.   dorzolamide-timolol (COSOPT) 22.3-6.8  MG/ML ophthalmic solution Place 1 drop into both eyes 2 (two) times daily.    fish oil-omega-3 fatty acids 1000 MG capsule Take 2 g by mouth daily.   gabapentin (NEURONTIN) 300 MG capsule Take 1 capsule (300 mg total) by mouth 3 (three) times daily. (Patient taking differently: Take 600 mg by mouth 3 (three) times daily. Takes 2 by mouth 3 times daily)   irbesartan (AVAPRO) 300 MG tablet Take 300 mg by mouth daily.   latanoprost (XALATAN) 0.005 % ophthalmic solution Place 1 drop into the left eye at bedtime.   levothyroxine (SYNTHROID) 100 MCG tablet Take 75 mcg by mouth daily. 75MG    Misc Natural Products (LUTEIN 20 PO) Take 1 tablet by mouth daily.   Multiple Vitamin (MULTIVITAMIN ADULT) TABS Take 1 tablet by mouth daily.   polyvinyl alcohol (LIQUIFILM TEARS) 1.4 % ophthalmic solution Place 1 drop into both eyes daily as needed (for dry eyes).     Allergies:   Erythromycin, Ezetimibe, Other, and Statins   Social History   Socioeconomic History   Marital status: Single    Spouse name: Not on file   Number of children: 3   Years of education: 14   Highest education level: Not on file  Occupational History   Occupation: retired  Tobacco Use   Smoking status: Former    Packs/day: 0.50    Years: 3.00    Pack years: 1.50    Types: Cigarettes    Start date: 01/24/1966    Quit date: 04/27/1968    Years since quitting: 53.5   Smokeless tobacco: Never   Tobacco comments:    quit over 40 years ago  Vaping Use   Vaping Use: Never used  Substance and Sexual Activity   Alcohol use: Yes    Comment: 1 every other month   Drug use: No   Sexual activity: Not Currently  Other Topics Concern   Not on file  Social History Narrative   Patient is single and lives alone.   Patient has 3 children.   Patient is retired.   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   Patient is right handed.   Patient drinks two cups of coffee daily and rarely sodas.   Social Determinants of Health    Financial Resource Strain: Not on file  Food Insecurity: Not on file  Transportation Needs: Not on file  Physical Activity: Not on file  Stress: Not on file  Social Connections: Not on file     Family History: The patient's family history includes Asthma in her maternal grandmother; Breast  cancer in her paternal grandmother; Cervical cancer in her daughter; Colon cancer (age of onset: 86) in her father; Colon cancer (age of onset: 2) in her paternal aunt; Diabetes in her brother and mother; Esophageal cancer (age of onset: 51) in her brother; Heart disease in her brother, mother, and sister; Hypertension in her brother, mother, and sister; Stroke in her sister; Thyroid disease in her sister; Thyroid disease (age of onset: 80) in an other family member.  ROS:   Please see the history of present illness.     All other systems reviewed and are negative.  EKGs/Labs/Other Studies Reviewed:    The following studies were reviewed today:   EKG:  EKG is ordered today.  The ekg ordered today demonstrates sinus bradycardia 52 bpm, otherwise completely normal ECG.  Recent Labs: 01/03/2021: BUN 13; Creatinine, Ser 0.66; Hemoglobin 10.9; Platelets 201; Potassium 3.8; Sodium 133  Recent Lipid Panel    Component Value Date/Time   CHOL 239 (H) 12/28/2012 1140   TRIG 166 (H) 12/28/2012 1140   HDL 59 12/28/2012 1140   CHOLHDL 4.1 12/28/2012 1140   VLDL 33 12/28/2012 1140   LDLCALC 147 (H) 12/28/2012 1140     Risk Assessment/Calculations:           Physical Exam:    VS:  BP 122/72   Pulse (!) 52   Ht 5\' 3"  (1.6 m)   Wt 176 lb 3.2 oz (79.9 kg)   SpO2 (!) 88%   BMI 31.21 kg/m     Wt Readings from Last 3 Encounters:  10/24/21 176 lb 3.2 oz (79.9 kg)  01/02/21 172 lb 6.4 oz (78.2 kg)  12/27/20 167 lb (75.8 kg)     GEN: Obese, well nourished, well developed in no acute distress HEENT: Normal NECK: No JVD; No carotid bruits LYMPHATICS: No lymphadenopathy CARDIAC: RRR, no  murmurs, rubs, gallops RESPIRATORY:  Clear to auscultation without rales, wheezing or rhonchi  ABDOMEN: Soft, non-tender, non-distended MUSCULOSKELETAL:  No edema; No deformity  SKIN: Warm and dry NEUROLOGIC:  Alert and oriented x 3 PSYCHIATRIC:  Normal affect   ASSESSMENT:    1. Hypertension, unspecified type   2. Hyperlipidemia, unspecified hyperlipidemia type    PLAN:    In order of problems listed above:  Hypercholesterolemia: LDL cholesterol elevation is severe at 209.  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  She has not tolerated lipid soluble statins (atorvastatin, simvastatin), but has not yet tried water-soluble statins (pravastatin, rosuvastatin).  We will try to risk stratify with a coronary calcium score.  If this is elevated would try intermittent dosing of rosuvastatin with coenzyme Q 10.  If this is also not tolerated, she meets criteria for treatment with a PCSK9 inhibitor such as Repatha. HTN: Well-controlled on current medications. Hypothyroidism/iatrogenic hyperthyroidism: Her dose of thyroid medication was too high and has recently been adjusted.  Her cholesterol numbers were high even so.           Medication Adjustments/Labs and Tests Ordered: Current medicines are reviewed at length with the patient today.  Concerns regarding medicines are outlined above.  Orders Placed This Encounter  Procedures   CT CARDIAC SCORING (SELF PAY ONLY)   EKG 12-Lead   No orders of the defined types were placed in this encounter.   Patient Instructions  Medication Instructions:  No changes *If you need a refill on your cardiac medications before your next appointment, please call your pharmacy*   Lab Work: None ordered If you have labs (  blood work) drawn today and your tests are completely normal, you will receive your results only by: [place] (if you have MyChart) OR A paper copy in the mail If you have any lab test that is  abnormal or we need to change your treatment, we will call you to review the results.   Testing/Procedures: Dr. Sallyanne Kuster has ordered a CT coronary calcium score. This test is done at [address]. This is $99 out of pocket.   Coronary CalciumScan A coronary calcium scan is an imaging test used to look for deposits of calcium and other fatty materials (plaques) in the inner lining of the blood vessels of the heart (coronary arteries). These deposits of calcium and plaques can partly clog and narrow the coronary arteries without producing any symptoms or warning signs. This puts a person at risk for a heart attack. This test can detect these deposits before symptoms develop. Tell a health care provider about: Any allergies you have. All medicines you are taking, including vitamins, herbs, eye drops, creams, and over-the-counter medicines. Any problems you or family members have had with anesthetic medicines. Any blood disorders you have. Any surgeries you have had. Any medical conditions you have. Whether you are pregnant or may be pregnant. What are the risks? Generally, this is a safe procedure. However, problems may occur, including: Harm to a pregnant woman and her unborn baby. This test involves the use of radiation. Radiation exposure can be dangerous to a pregnant woman and her unborn baby. If you are pregnant, you generally should not have this procedure done. Slight increase in the risk of cancer. This is because of the radiation involved in the test. What happens before the procedure? No preparation is needed for this procedure. What happens during the procedure? You will undress and remove any jewelry around your neck or chest. You will put on a hospital gown. Sticky electrodes will be placed on your chest. The electrodes will be connected to an electrocardiogram (ECG) machine to record a tracing of the electrical activity of your heart. A CT scanner will take  pictures of your heart. During this time, you will be asked to lie still and hold your breath for 2-3 seconds while a picture of your heart is being taken. The procedure may vary among health care providers and hospitals. What happens after the procedure? You can get dressed. You can return to your normal activities. It is up to you to get the results of your test. Ask your health care provider, or the department that is doing the test, when your results will be ready. Summary A coronary calcium scan is an imaging test used to look for deposits of calcium and other fatty materials (plaques) in the inner lining of the blood vessels of the heart (coronary arteries). Generally, this is a safe procedure. Tell your health care provider if you are pregnant or may be pregnant. No preparation is needed for this procedure. A CT scanner will take pictures of your heart. You can return to your normal activities after the scan is done. This information is not intended to replace advice given to you by your health care provider. Make sure you discuss any questions you have with your health care provider. Document Released: 05/09/2008 Document Revised: 09/30/2016 Document Reviewed: 09/30/2016 Elsevier Interactive Patient Education  2017 [place]: At [facility], you and your health needs are our priority.  As part of our continuing mission to  provide you with exceptional heart care, we have created designated Provider Care Teams.  These Care Teams include your primary Cardiologist (physician) and Advanced Practice Providers (APPs -  Physician Assistants and Nurse Practitioners) who all work together to provide you with the care you need, when you need it.  We recommend signing up for the patient portal called "MyChart".  Sign up information is provided on this After Visit Summary.  MyChart is used to connect with patients for Virtual Visits (Telemedicine).  Patients are able to view  lab/test results, encounter notes, upcoming appointments, etc.  Non-urgent messages can be sent to your provider as well.   To learn more about what you can do with MyChart, go to [email or web address].    Your next appointment:   12 month(s)  The format for your next appointment:   In Person  Provider:   Sanda Klein, MD        Signed, Sanda Klein, MD  10/28/2021 5:22 PM    [place]

## 2021-11-01 ENCOUNTER — Ambulatory Visit (AMBULATORY_SURGERY_CENTER): Payer: Medicare HMO | Admitting: *Deleted

## 2021-11-01 ENCOUNTER — Other Ambulatory Visit: Payer: Self-pay

## 2021-11-01 VITALS — Ht 63.0 in | Wt 176.0 lb

## 2021-11-01 DIAGNOSIS — Z86010 Personal history of colonic polyps: Secondary | ICD-10-CM

## 2021-11-01 MED ORDER — PEG 3350-KCL-NA BICARB-NACL 420 G PO SOLR: 4000.0000 mL | Freq: Once | ORAL | 0 refills | Status: AC

## 2021-11-01 NOTE — Progress Notes (Signed)
Patient's pre-visit was done today over the phone with the patient. Name,DOB and address verified. Patient denies any allergies to Eggs and Soy. Patient denies any problems with anesthesia/sedation. Patient is not taking any diet pills or blood thinners. No home Oxygen. Packet of Prep instructions mailed to patient including a copy of a consent form -pt is aware. Prep instructions sent to pt's MyChart (if activated).Patient understands to call us back with any questions or concerns. Patient is aware of our care-partner policy and KPTWS-56 safety protocol.   EMMI education assigned to the patient for the procedure, sent to [place].

## 2021-11-08 ENCOUNTER — Encounter: Payer: Medicare HMO | Admitting: Gastroenterology

## 2021-11-13 ENCOUNTER — Other Ambulatory Visit: Payer: Self-pay

## 2021-11-13 ENCOUNTER — Ambulatory Visit (INDEPENDENT_AMBULATORY_CARE_PROVIDER_SITE_OTHER)
Admission: RE | Admit: 2021-11-13 | Discharge: 2021-11-13 | Disposition: A | Discharge status: Home / Self Care | Payer: Self-pay | Source: Ambulatory Visit | Attending: Cardiovascular Disease | Admitting: Cardiovascular Disease

## 2021-11-13 DIAGNOSIS — E7801 Familial hypercholesterolemia: Secondary | ICD-10-CM

## 2021-11-27 ENCOUNTER — Encounter: Payer: Self-pay | Admitting: Gastroenterology

## 2021-11-28 ENCOUNTER — Encounter: Payer: Self-pay | Admitting: Gastroenterology

## 2021-11-28 ENCOUNTER — Ambulatory Visit (AMBULATORY_SURGERY_CENTER): Payer: Medicare Other | Admitting: Gastroenterology

## 2021-11-28 ENCOUNTER — Encounter: Payer: Medicare HMO | Admitting: Gastroenterology

## 2021-11-28 ENCOUNTER — Other Ambulatory Visit: Payer: Self-pay

## 2021-11-28 VITALS — BP 153/66 | HR 49 | Temp 96.6°F | Resp 11 | Ht 63.0 in | Wt 176.0 lb

## 2021-11-28 DIAGNOSIS — Z86010 Personal history of colonic polyps: Secondary | ICD-10-CM

## 2021-11-28 DIAGNOSIS — D122 Benign neoplasm of ascending colon: Secondary | ICD-10-CM

## 2021-11-28 DIAGNOSIS — K635 Polyp of colon: Secondary | ICD-10-CM | POA: Diagnosis not present

## 2021-11-28 DIAGNOSIS — D125 Benign neoplasm of sigmoid colon: Secondary | ICD-10-CM | POA: Diagnosis not present

## 2021-11-28 DIAGNOSIS — Z800 Family history of malignant neoplasm of digestive organs: Secondary | ICD-10-CM | POA: Diagnosis not present

## 2021-11-28 DIAGNOSIS — D120 Benign neoplasm of cecum: Secondary | ICD-10-CM

## 2021-11-28 MED ORDER — SODIUM CHLORIDE 0.9 % IV SOLN: 500.0000 mL | Freq: Once | INTRAVENOUS | Status: DC

## 2021-11-28 NOTE — Op Note (Addendum)
[facility] Patient Name: [name] [name] Procedure Date: 11/28/2021 3:32 PM MRN: [id number] Endoscopist: Ladene Artist , MD Age: 76 Referring MD:  Date of Birth: 10-16-1946 Gender: Female Account #: [id number] Procedure:                Colonoscopy Indications:              High risk colon cancer surveillance: Personal                            history of sessile serrated colon polyp (10 mm or                            greater in size), Family history of colon cancer,                            father and paternal aunt in their 47s. Medicines:                Monitored Anesthesia Care Procedure:                Pre-Anesthesia Assessment:                           - Prior to the procedure, a History and Physical                            was performed, and patient medications and                            allergies were reviewed. The patient's tolerance of                            previous anesthesia was also reviewed. The risks                            and benefits of the procedure and the sedation                            options and risks were discussed with the patient.                            All questions were answered, and informed consent                            was obtained. Prior Anticoagulants: The patient has                            taken no previous anticoagulant or antiplatelet                            agents. ASA Grade Assessment: II - A patient with                            mild systemic disease. After reviewing the risks  and benefits, the patient was deemed in                            satisfactory condition to undergo the procedure.                           After obtaining informed consent, the colonoscope                            was passed under direct vision. Throughout the                            procedure, the patient's blood pressure, pulse, and                            oxygen saturations were  monitored continuously. The                            CF [id number] #[id number] was introduced through the anus                            and advanced to the the cecum, identified by                            appendiceal orifice and ileocecal valve. The                            ileocecal valve, appendiceal orifice, and rectum                            were photographed. The quality of the bowel                            preparation was good. The colonoscopy was performed                            without difficulty. The patient tolerated the                            procedure well. Scope In: 3:38:51 PM Scope Out: 4:12:41 PM Scope Withdrawal Time: 0 hours 30 minutes 24 seconds  Total Procedure Duration: 0 hours 33 minutes 50 seconds  Findings:                 The perianal and digital rectal examinations were                            normal.                           A 25 mm polyp was found in the cecum. The polyp was                            sessile. The polyp was removed with a piecemeal  technique using a cold snare. Resection appeared to                            be complete and retrieval were complete.                           A 30 mm polyp was found in the ascending colon                            proximal ascending colon near the IC valve. The                            polyp was sessile. The polyp was removed with a                            piecemeal technique using a cold snare. Resection                            appeared to be and retrieval were complete.                           Two sessile polyps were found in the ascending                            colon. The polyps were 5 to 12 mm in size. These                            polyps were removed with a cold snare. Resection                            and retrieval were complete.                           Two pedunculated and sessile polyps were found in                            the  sigmoid colon. The polyps were 12 to 6 mm in                            size, respectively. These polyps were removed with                            a cold snare. Resection and retrieval were complete.                           Multiple small-mouthed diverticula were found in                            the left colon. There was no evidence of                            diverticular bleeding.  Internal hemorrhoids were found during                            retroflexion. The hemorrhoids were small and Grade                            I (internal hemorrhoids that do not prolapse).                           The exam was otherwise without abnormality on                            direct and retroflexion views. Complications:            No immediate complications. Estimated blood loss:                            None. Estimated Blood Loss:     Estimated blood loss: none. Impression:               - One 25 mm polyp in the cecum, removed piecemeal                            using a cold snare. Resected and retrieved.                           - One 30 mm polyp in the ascending colon in the                            proximal ascending colon, removed piecemeal using a                            cold snare. Resected and retrieved.                           - Two 5 to 12 mm polyps in the ascending colon,                            removed with a cold snare. Resected and retrieved.                           - Two 6 to 12 mm polyps in the sigmoid colon,                            removed with a cold snare. Resected and retrieved.                           - Mild diverticulosis in the left colon.                           - Internal hemorrhoids.                           - The examination was otherwise normal on direct  and retroflexion views. Recommendation:           - Repeat colonoscopy, likely 6 months, after                            studies  are complete for assesment of piecemeal                            polypectomy sites and surveillance based on                            pathology results.                           - Patient has a contact number available for                            emergencies. The signs and symptoms of potential                            delayed complications were discussed with the                            patient. Return to normal activities tomorrow.                            Written discharge instructions were provided to the                            patient.                           - Resume previous diet.                           - Continue present medications.                           - Await pathology results.                           - No aspirin, ibuprofen, naproxen, or other                            non-steroidal anti-inflammatory drugs for 3 weeks                            after polyp removal. Ladene Artist, MD 11/28/2021 4:20:00 PM This report has been signed electronically.

## 2021-11-28 NOTE — Progress Notes (Signed)
Called to room to assist during endoscopic procedure.  Patient ID and intended procedure confirmed with present staff. Received instructions for my participation in the procedure from the performing physician.  

## 2021-11-28 NOTE — Progress Notes (Signed)
Pt's states no medical or surgical changes since previsit or office visit. VS assessed by D.T 

## 2021-11-28 NOTE — Progress Notes (Signed)
History & Physical  Primary Care Physician:  Aretta Nip, MD Primary Gastroenterologist: Lucio Edward, MD  CHIEF COMPLAINT:   Personal history of colon polyps, family history of colon cancer  HPI: [name] [name] is a 76 y.o. female with a personal history of a 1.2 cm sessile serrated polyp in 2015.  Her father and a paternal aunt had colon cancer in their 49s.  She is here for surveillance colonoscopy.    Past Medical History:  Diagnosis Date   Abnormal Pap smear    Abnormal peripheral vision    RT EYE   Anemia    Arthritis    back and neck   Breast cancer ([facility]) 1997   right breast   Bruises easily    Cataracts, bilateral    Chronic back pain    Chronic neck pain    Complication of anesthesia    slow to wake up   Family history of adverse reaction to anesthesia    Dad takes a long time to wake up   Frequency of urination    GERD (gastroesophageal reflux disease)    Goiter    High cholesterol    History of postoperative nausea and vomiting    Hypertension    Hypothyroidism    Numbness    hands / feet /arms / legs - followed by Dr. Georgann Housekeeper - [place]   PONV (postoperative nausea and vomiting)    Post-menopausal    Sexual dysfunction    Shortness of breath    with exertion and Wt gain   Urinary incontinence     Past Surgical History:  Procedure Laterality Date   BIOPSY THYROID     BREAST SURGERY Right    lymph node removed   CATARACT EXTRACTION, BILATERAL  2009,2010   COLONOSCOPY  02/15/2014   Dr.Aurielle Slingerland   EYE SURGERY  08/2012   detached retina surg   LAPAROSCOPY  1978   BTL   lumpectomy  1997   Rt   neck fusion  2010   THYROIDECTOMY N/A 05/04/2013   Procedure:  TOTAL THYROIDECTOMY;  Surgeon: Earnstine Regal, MD;  Location: [facility];  Service: General;  Laterality: N/A;   TOTAL HIP ARTHROPLASTY Right 01/02/2021   Procedure: TOTAL HIP ARTHROPLASTY ANTERIOR APPROACH;  Surgeon: Paralee Cancel, MD;  Location: [facility];  Service: Orthopedics;   Laterality: Right;  70 mins    Prior to Admission medications   Medication Sig Start Date End Date Taking? Authorizing Provider  BIOTIN PO Take 1 capsule by mouth daily.   Yes [provider]  Biotin w/ Vitamins C & E (HAIR/SKIN/NAILS PO) Take by mouth.   Yes [provider]  dorzolamide-timolol (COSOPT) 22.3-6.8 MG/ML ophthalmic solution Place 1 drop into both eyes 2 (two) times daily.    Yes [provider]  fish oil-omega-3 fatty acids 1000 MG capsule Take 2 g by mouth daily.   Yes [provider]  gabapentin (NEURONTIN) 300 MG capsule Take 1 capsule (300 mg total) by mouth 3 (three) times daily. Patient taking differently: Take 600 mg by mouth 3 (three) times daily. Takes 2 by mouth 3 times daily 06/29/15  Yes Marcial Pacas, MD  irbesartan (AVAPRO) 300 MG tablet Take 300 mg by mouth daily.   Yes [provider]  latanoprost (XALATAN) 0.005 % ophthalmic solution Place 1 drop into the left eye at bedtime. 05/04/20  Yes [provider]  levothyroxine (SYNTHROID) 100 MCG tablet Take 75 mcg by mouth daily. 75MG  08/07/20  Yes [provider]  Misc Natural Products (LUTEIN 20 PO) Take 1 tablet by mouth daily.   Yes [provider]  Multiple Vitamin (MULTIVITAMIN ADULT) TABS Take 1 tablet by mouth daily.   Yes [provider]  aspirin 81 MG chewable tablet 162 mg. 01/03/21   [provider]  polyvinyl alcohol (LIQUIFILM TEARS) 1.4 % ophthalmic solution Place 1 drop into both eyes daily as needed (for dry eyes). Patient not taking: Reported on 11/28/2021    [provider]  zinc gluconate 50 MG tablet Take 50 mg by mouth daily. Patient not taking: Reported on 11/28/2021    [provider]    Current Outpatient Medications  Medication Sig Dispense Refill   BIOTIN PO Take 1 capsule by mouth daily.     Biotin w/ Vitamins C & E (HAIR/SKIN/NAILS PO) Take by mouth.     dorzolamide-timolol (COSOPT) 22.3-6.8  MG/ML ophthalmic solution Place 1 drop into both eyes 2 (two) times daily.      fish oil-omega-3 fatty acids 1000 MG capsule Take 2 g by mouth daily.     gabapentin (NEURONTIN) 300 MG capsule Take 1 capsule (300 mg total) by mouth 3 (three) times daily. (Patient taking differently: Take 600 mg by mouth 3 (three) times daily. Takes 2 by mouth 3 times daily) 270 capsule 3   irbesartan (AVAPRO) 300 MG tablet Take 300 mg by mouth daily.     latanoprost (XALATAN) 0.005 % ophthalmic solution Place 1 drop into the left eye at bedtime.     levothyroxine (SYNTHROID) 100 MCG tablet Take 75 mcg by mouth daily. 75MG      Misc Natural Products (LUTEIN 20 PO) Take 1 tablet by mouth daily.     Multiple Vitamin (MULTIVITAMIN ADULT) TABS Take 1 tablet by mouth daily.     aspirin 81 MG chewable tablet 162 mg.     polyvinyl alcohol (LIQUIFILM TEARS) 1.4 % ophthalmic solution Place 1 drop into both eyes daily as needed (for dry eyes). (Patient not taking: Reported on 11/28/2021)     zinc gluconate 50 MG tablet Take 50 mg by mouth daily. (Patient not taking: Reported on 11/28/2021)     Current Facility-Administered Medications  Medication Dose Route Frequency Provider Last Rate Last Admin   0.9 %  sodium chloride infusion  500 mL Intravenous Once Ladene Artist, MD        Allergies as of 11/28/2021 - Review Complete 11/28/2021  Allergen Reaction Noted   Erythromycin Nausea Only 06/29/2015   Ezetimibe  08/08/2020   Other Itching 09/14/2012   Statins  08/08/2020    Family History  Problem Relation Age of Onset   Cervical cancer Daughter        dx in her 13s   Heart disease Sister    Heart disease Mother    Diabetes Mother    Hypertension Mother    Colon cancer Father 35   Stroke Sister    Hypertension Sister    Esophageal cancer Brother 43       smoker   Diabetes Brother    Heart disease Brother    Hypertension Brother    Asthma Maternal Grandmother    Colon cancer Paternal Aunt 89   Breast cancer  Paternal Grandmother        dx in her 48s   Thyroid disease Other 59       niece   Thyroid disease Sister     Social History   Socioeconomic History  Marital status: Single    Spouse name: Not on file   Number of children: 3   Years of education: 12   Highest education level: Not on file  Occupational History   Occupation: retired  Tobacco Use   Smoking status: Former    Packs/day: 0.50    Years: 3.00    Pack years: 1.50    Types: Cigarettes    Start date: 01/24/1966    Quit date: 04/27/1968    Years since quitting: 53.6   Smokeless tobacco: Never   Tobacco comments:    quit over 40 years ago  Vaping Use   Vaping Use: Never used  Substance and Sexual Activity   Alcohol use: Yes    Comment: 1 every other month   Drug use: No   Sexual activity: Not Currently  Other Topics Concern   Not on file  Social History Narrative   Patient is single and lives alone.   Patient has 3 children.   Patient is retired.   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   Patient is right handed.   Patient drinks two cups of coffee daily and rarely sodas.   Social Determinants of Health   Financial Resource Strain: Not on file  Food Insecurity: Not on file  Transportation Needs: Not on file  Physical Activity: Not on file  Stress: Not on file  Social Connections: Not on file  Intimate Partner Violence: Not on file    Review of Systems:  All systems reviewed an negative except where noted in HPI.  Gen: Denies any fever, chills, sweats, anorexia, fatigue, weakness, malaise, weight loss, and sleep disorder CV: Denies chest pain, angina, palpitations, syncope, orthopnea, PND, peripheral edema, and claudication. Resp: Denies dyspnea at rest, dyspnea with exercise, cough, sputum, wheezing, coughing up blood, and pleurisy. GI: Denies vomiting blood, jaundice, and fecal incontinence.   Denies dysphagia or odynophagia. GU : Denies urinary burning, blood in urine, urinary  frequency, urinary hesitancy, nocturnal urination, and urinary incontinence. MS: Denies joint pain, limitation of movement, and swelling, stiffness, low back pain, extremity pain. Denies muscle weakness, cramps, atrophy.  Derm: Denies rash, itching, dry skin, hives, moles, warts, or unhealing ulcers.  Psych: Denies depression, anxiety, memory loss, suicidal ideation, hallucinations, paranoia, and confusion. Heme: Denies bruising, bleeding, and enlarged lymph nodes. Neuro:  Denies any headaches, dizziness, paresthesias. Endo:  Denies any problems with DM, thyroid, adrenal function.   Physical Exam: Vital signs in last 24 hours: General:  Alert, well-developed, in NAD Head:  Normocephalic and atraumatic. Eyes:  Sclera clear, no icterus.   Conjunctiva pink. Ears:  Normal auditory acuity. Mouth:  No deformity or lesions.  Neck:  Supple; no masses . Lungs:  Clear throughout to auscultation.   No wheezes, crackles, or rhonchi. No acute distress. Heart:  Regular rate and rhythm; no murmurs. Abdomen:  Soft, nondistended, nontender. No masses, hepatomegaly. No obvious masses.  Normal bowel .    Rectal:  Deferred   Msk:  Symmetrical without gross deformities.. Pulses:  Normal pulses noted. Extremities:  Without edema. Neurologic:  Alert and  oriented x4;  grossly normal neurologically. Skin:  Intact without significant lesions or rashes. Cervical Nodes:  No significant cervical adenopathy. Psych:  Alert and cooperative. Normal mood and affect.   Impression / Plan:   Personal history of a 1.2 cm sessile serrated polyp in 2015.  Her father and a paternal aunt had colon cancer in their 72s.  She is here today for surveillance  colonoscopy.     Pricilla Riffle. Fuller Plan  11/28/2021, 3:33 PM See Shea Evans,  GI, to contact our on call provider

## 2021-11-28 NOTE — Patient Instructions (Signed)
Informations provided on polyps, hemorrhoids, and diverticulosis  Await pathology report.  Resume previous diet  Continue present medications.  No Aspirin, Ibuprofen, Aleve, Naproxen or other non-steroid anti- inflammatory drugs for 3 weeks after polyp removal    YOU HAD AN ENDOSCOPIC PROCEDURE TODAY AT [facility]:   Refer to the procedure report that was given to you for any specific questions about what was found during the examination.  If the procedure report does not answer your questions, please call your gastroenterologist to clarify.  If you requested that your care partner not be given the details of your procedure findings, then the procedure report has been included in a sealed envelope for you to review at your convenience later.  YOU SHOULD EXPECT: Some feelings of bloating in the abdomen. Passage of more gas than usual.  Walking can help get rid of the air that was put into your GI tract during the procedure and reduce the bloating. If you had a lower endoscopy (such as a colonoscopy or flexible sigmoidoscopy) you may notice spotting of blood in your stool or on the toilet paper. If you underwent a bowel prep for your procedure, you may not have a normal bowel movement for a few days.  Please Note:  You might notice some irritation and congestion in your nose or some drainage.  This is from the oxygen used during your procedure.  There is no need for concern and it should clear up in a day or so.  SYMPTOMS TO REPORT IMMEDIATELY:  Following lower endoscopy (colonoscopy or flexible sigmoidoscopy):  Excessive amounts of blood in the stool  Significant tenderness or worsening of abdominal pains  Swelling of the abdomen that is new, acute  Fever of 100F or higher    For urgent or emergent issues, a gastroenterologist can be reached at any hour by calling [phone]. Do not use MyChart messaging for urgent concerns.    DIET:  We do recommend a small meal  at first, but then you may proceed to your regular diet.  Drink plenty of fluids but you should avoid alcoholic beverages for 24 hours.  ACTIVITY:  You should plan to take it easy for the rest of today and you should NOT DRIVE or use heavy machinery until tomorrow (because of the sedation medicines used during the test).    FOLLOW UP: Our staff will call the number listed on your records 48-72 hours following your procedure to check on you and address any questions or concerns that you may have regarding the information given to you following your procedure. If we do not reach you, we will leave a message.  We will attempt to reach you two times.  During this call, we will ask if you have developed any symptoms of COVID 19. If you develop any symptoms (ie: fever, flu-like symptoms, shortness of breath, cough etc.) before then, please call [phone].  If you test positive for Covid 19 in the 2 weeks post procedure, please call and report this information to [place].    If any biopsies were taken you will be contacted by phone or by letter within the next 1-3 weeks.  Please call us at [phone] if you have not heard about the biopsies in 3 weeks.    SIGNATURES/CONFIDENTIALITY: You and/or your care partner have signed paperwork which will be entered into your electronic medical record.  These signatures attest to the fact that that the information above on your After Visit Summary  has been reviewed and is understood.  Full responsibility of the confidentiality of this discharge information lies with you and/or your care-partner.

## 2021-11-28 NOTE — Progress Notes (Signed)
PT taken to PACU. Monitors in place. VSS. Report given to RN. 

## 2021-11-30 ENCOUNTER — Telehealth: Payer: Self-pay | Admitting: *Deleted

## 2021-11-30 NOTE — Telephone Encounter (Signed)
No answer on follow call.

## 2021-11-30 NOTE — Telephone Encounter (Signed)
No answer for post procedure followup call. Left VM. ?

## 2021-12-11 ENCOUNTER — Encounter: Payer: Self-pay | Admitting: Gastroenterology

## 2021-12-26 ENCOUNTER — Other Ambulatory Visit: Payer: Self-pay

## 2021-12-26 ENCOUNTER — Encounter: Payer: Self-pay | Admitting: Family Medicine

## 2021-12-26 ENCOUNTER — Telehealth: Payer: Self-pay | Admitting: Family Medicine

## 2021-12-26 ENCOUNTER — Ambulatory Visit: Payer: Medicare Other | Admitting: Family Medicine

## 2021-12-26 VITALS — BP 100/70 | HR 55 | Temp 97.3°F | Ht 64.0 in | Wt 174.4 lb

## 2021-12-26 DIAGNOSIS — E7800 Pure hypercholesterolemia, unspecified: Secondary | ICD-10-CM

## 2021-12-26 DIAGNOSIS — I10 Essential (primary) hypertension: Secondary | ICD-10-CM

## 2021-12-26 DIAGNOSIS — H401130 Primary open-angle glaucoma, bilateral, stage unspecified: Secondary | ICD-10-CM | POA: Diagnosis not present

## 2021-12-26 DIAGNOSIS — I700 Atherosclerosis of aorta: Secondary | ICD-10-CM

## 2021-12-26 DIAGNOSIS — E890 Postprocedural hypothyroidism: Secondary | ICD-10-CM | POA: Diagnosis not present

## 2021-12-26 DIAGNOSIS — C50911 Malignant neoplasm of unspecified site of right female breast: Secondary | ICD-10-CM

## 2021-12-26 DIAGNOSIS — G894 Chronic pain syndrome: Secondary | ICD-10-CM

## 2021-12-26 DIAGNOSIS — K635 Polyp of colon: Secondary | ICD-10-CM

## 2021-12-26 LAB — T3, FREE: T3, Free: 3 pg/mL (ref 2.3–4.2)

## 2021-12-26 LAB — T4, FREE: Free T4: 1.2 ng/dL (ref 0.60–1.60)

## 2021-12-26 LAB — TSH: TSH: 2.19 u[IU]/mL (ref 0.35–5.50)

## 2021-12-26 MED ORDER — IRBESARTAN 300 MG PO TABS: 300.0000 mg | ORAL_TABLET | Freq: Every day | ORAL | 3 refills | Status: DC

## 2021-12-26 MED ORDER — ROSUVASTATIN CALCIUM 5 MG PO TABS: 5.0000 mg | ORAL_TABLET | ORAL | 3 refills | Status: DC

## 2021-12-26 MED ORDER — GABAPENTIN 300 MG PO CAPS: 300.0000 mg | ORAL_CAPSULE | Freq: Three times a day (TID) | ORAL | 3 refills | Status: DC

## 2021-12-26 NOTE — Telephone Encounter (Signed)
Declined medicare wellness on 12/26/21 at check out. Decided to take an in office physical since she has adv medicare.- lco

## 2021-12-26 NOTE — Addendum Note (Signed)
Addended by: Wellington Hampshire on: 12/26/2021 10:02 AM   Modules accepted: Orders

## 2021-12-26 NOTE — Patient Instructions (Signed)
Welcome to [facility] at [facility] [place]! It was a pleasure meeting you today.  As discussed, Please schedule a 6 month follow up visit today.  PLEASE NOTE:  If you had any LAB tests please let us know if you have not heard back within a few days. You may see your results on MyChart before we have a chance to review them but we will give you a call once they are reviewed by us. If we ordered any REFERRALS today, please let us know if you have not heard from their office within the next week.  Let us know through MyChart if you are needing REFILLS, or have your pharmacy send us the request. You can also use MyChart to communicate with me or any office staff.  Please try these tips to maintain a healthy lifestyle:  Eat most of your calories during the day when you are active. Eliminate processed foods including packaged sweets (pies, cakes, cookies), reduce intake of potatoes, white bread, white pasta, and white rice. Look for whole grain options, oat flour or almond flour.  Each meal should contain half fruits/vegetables, one quarter protein, and one quarter carbs (no bigger than a computer mouse).  Cut down on sweet beverages. This includes juice, soda, and sweet tea. Also watch fruit intake, though this is a healthier sweet option, it still contains natural sugar! Limit to 3 servings daily.  Drink at least 1 glass of water with each meal and aim for at least 8 glasses per day  Exercise at least 150 minutes every week.   

## 2021-12-26 NOTE — Progress Notes (Signed)
New Patient Office Visit  Subjective:  Patient ID: [name] [name], female    DOB: [birth year]  Age: 76 y.o. 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  Would like referral to Endo.  Was on 100 so down to 75 but dry skin, etc so thinks needs 88 when on in past. Changed 4-5 months ago. Feet and hands always cold.  [name] [name] endo. Would like new endo.  Mixed hyperlipidemia-seeing Card.  Calcium score 1.  Has aortic valve score of 309.  Has aortic atherosclerosis. Suggested Rosuvastatin twice weekly as intol statins but LDL 209-pt hasn't gotten meds.   Colon polyps-seeing GI.  Cscope 11/28/21-mult polyps-repeat 59mo Open angle Glauocma-seeing [name] [name] Chronic neck/back pain-was seeing neuro-but not for long time. PCP filling gabapentin and working ok.  HTN-on avapro-doing well. 110-130.  Occ higher. NAS.  No ha/dizziness/cp/edema/sob/cough Breast ca-mamm 03/29/21.  Breast ctr.  Doesn't do immunizaitons.  Line dancing 2x/wk.  Had cpx 08/22/21 Past Medical History:  Diagnosis Date   Abnormal Pap smear    Abnormal peripheral vision    RT EYE   Allergy    Anemia    Arthritis    back and neck   Breast cancer ([place]) 1997   right breast   Bruises easily    Cataracts, bilateral    Chronic back pain    Chronic neck pain    Complication of anesthesia    slow to wake up   Family history of adverse reaction to anesthesia    Dad takes a long time to wake up   Frequency of urination    GERD (gastroesophageal reflux disease)    Glaucoma    Goiter    High cholesterol    History of postoperative nausea and vomiting    Hypertension    Hypothyroidism    Neuromuscular disorder (HCC)    Numbness    hands / feet /arms / legs  - followed by [name] [name] - [place]   PONV (postoperative nausea and vomiting)    Post-menopausal    Sexual dysfunction    Shortness of breath    with exertion and Wt gain   Urinary incontinence     Past Surgical History:  Procedure Laterality Date   BIOPSY THYROID     BREAST SURGERY Right    lymph node removed   CATARACT EXTRACTION, BILATERAL  2009,2010   COLONOSCOPY  02/15/2014   [facility]   EYE SURGERY  08/2012   detached retina surg   LAPAROSCOPY  1978   BTL   lumpectomy  1997   Rt   neck fusion  2010   THYROIDECTOMY N/A 05/04/2013   Procedure:  TOTAL THYROIDECTOMY;  Surgeon: [name] Regal, MD;  Location: [facility];  Service: General;  Laterality: N/A;   TOTAL HIP ARTHROPLASTY Right 01/02/2021   Procedure: TOTAL HIP ARTHROPLASTY ANTERIOR APPROACH;  Surgeon: Paralee Cancel, MD;  Location: [facility];  Service: Orthopedics;  Laterality: Right;  70 mins   TUBAL LIGATION      Family History  Problem Relation Age of Onset   Hyperlipidemia Mother    Heart disease Mother    Diabetes Mother    Hypertension Mother    Hyperlipidemia  Father    Colon cancer Father 72   Hearing loss Sister    Hyperlipidemia Sister    Heart disease Sister    Heart disease Sister    Hyperlipidemia Sister    Stroke Sister    Hypertension Sister    Thyroid disease Sister    Hyperlipidemia Brother    Drug abuse Brother    Early death Brother    Esophageal cancer Brother 14       smoker   Diabetes Brother    Heart disease Brother    Hypertension Brother    Heart attack Brother    Early death Brother    Depression Daughter    Cervical cancer Daughter        dx in her 24s   Colon cancer Paternal Aunt 89   Heart disease Maternal Grandmother    Early death Maternal Grandmother    Diabetes Maternal Grandmother    Asthma Maternal Grandmother    Diabetes Paternal Grandmother    Hypertension Paternal Grandmother    Heart disease Paternal Grandmother    Breast cancer Paternal Grandmother         dx in her 39s   Heart disease Paternal Grandfather    Thyroid disease Other 15       niece    Social History   Socioeconomic History   Marital status: Single    Spouse name: Not on file   Number of children: 3   Years of education: 62   Highest education level: Not on file  Occupational History   Occupation: retired  Tobacco Use   Smoking status: Former    Packs/day: 0.50    Years: 3.00    Pack years: 1.50    Types: Cigarettes    Start date: 01/24/1966    Quit date: 04/27/1968    Years since quitting: 53.7   Smokeless tobacco: Never   Tobacco comments:    quit over 40 years ago  Vaping Use   Vaping Use: Never used  Substance and Sexual Activity   Alcohol use: Yes    Comment: 1 every other month   Drug use: No   Sexual activity: Not Currently  Other Topics Concern   Not on file  Social History Narrative   Patient is single and lives alone.Patient has 3 children and 1 step daughter. 5 grands, +3 steps,  6 great grands(step). 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   Social Determinants of Health   Financial Resource Strain: Not on file  Food Insecurity: Not on file  Transportation Needs: Not on file  Physical Activity: Not on file  Stress: Not on file  Social Connections: Not on file  Intimate Partner Violence: Not on file    ROS: negative/noncontributory except as in HPI Occ dry throat in am.  Heartburn at hs Urinates freq and hard to get there at times.  Feels like has to go a lot.   Objective:   Today's Vitals: BP 100/70    Pulse (!) 55    Temp (!) 97.3 F (36.3 C) (Temporal)    Ht 5\' 4"  (1.626 m)    Wt 174 lb 6 oz (79.1 kg)    SpO2 97%    BMI 29.93 kg/m   Gen: WDWN NAD WF HEENT: NCAT, conjunctiva not injected, sclera nonicteric TM WNL B, OP moist, no exudates  NECK:  supple, no thyroid-just scar, no nodes, no  carotid bruits CARDIAC: RRR,  S1S2+, faint 1/6 soft murmur. DP 2+B LUNGS: CTAB. No wheezes ABDOMEN:  BS+, soft, NTND, No HSM, no masses EXT:  no edema MSK: no gross abnormalities.  NEURO: A&O x3.  CN II-XII intact.  PSYCH: normal mood. Good eye contact   Assessment & Plan:   Problem List Items Addressed This Visit       Cardiovascular and Mediastinum   Essential hypertension, benign   Relevant Medications   irbesartan (AVAPRO) 300 MG tablet   rosuvastatin (CRESTOR) 5 MG tablet (Start on 12/27/2021)   Aortic atherosclerosis (HCC)   Relevant Medications   irbesartan (AVAPRO) 300 MG tablet   rosuvastatin (CRESTOR) 5 MG tablet (Start on 12/27/2021)     Digestive   Polyp of colon     Endocrine   Hypothyroidism - Primary   Relevant Orders   TSH   T3, free   T4, free     Other   Breast cancer (HCC)   High cholesterol   Relevant Medications   irbesartan (AVAPRO) 300 MG tablet   rosuvastatin (CRESTOR) 5 MG tablet (Start on 12/27/2021)   Glaucoma   Chronic pain   Relevant Medications   gabapentin (NEURONTIN) 300 MG capsule  1.  Postsurgical hypothyroidism-dose was adjusted several months ago.  Patient does not feel like is the right dose.  She states she feels better on 88 mcg.  Will check thyroid function test.  Patient would like referral to [facility] endocrinology. 2.  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  Reviewed cardiology notes.  Suggested trying rosuvastatin twice weekly with co-Q10 as patient intolerant of other statins.  She is agreeable to trying this. 3.  Aortic atherosclerosis-discussed this with patient is being rationale for taking statin. 4.  Colon polyps-seeing GI.  Just had a recent colonoscopy.  We will have her repeat in 6 months due to multiple polyps 5.  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  Being followed by ophthalmology 6.  Chronic pain-has neck and back issues.  Well managed on gabapentin 300 mg 3 times daily.  Continue 7.  Hypertension-well-controlled.  Continue meds.  Reviewed labs  from 08/22/2021. 8.  History of breast cancer-status post lumpectomy and radiation.  Mammogram is up-to-date.  In remission 9.  Patient declines immunizations.  Spent 30 minutes with patient getting history, updating history, discussing tests and plan.  Spent 20 minutes reviewing old notes, labs, tests.  Outpatient Encounter Medications as of 12/26/2021  Medication Sig   BIOTIN PO Take 1 capsule by mouth daily.   Biotin w/ Vitamins C & E (HAIR/SKIN/NAILS PO) Take by mouth.   dorzolamide-timolol (COSOPT) 22.3-6.8 MG/ML ophthalmic solution Place 1 drop into both eyes 2 (two) times daily.    latanoprost (XALATAN) 0.005 % ophthalmic solution Place 1 drop into the left eye at bedtime.   levothyroxine (SYNTHROID) 100 MCG tablet Take 75 mcg by mouth daily. 75MG    Multiple Vitamin (MULTIVITAMIN ADULT) TABS Take 1 tablet by mouth daily.   polyvinyl alcohol (LIQUIFILM TEARS) 1.4 % ophthalmic solution Place 1 drop into both eyes daily as needed (for dry eyes).   [START ON 12/27/2021] rosuvastatin (CRESTOR) 5 MG tablet Take 1 tablet (5 mg total) by mouth 2 (two) times a week.   [DISCONTINUED] gabapentin (NEURONTIN) 300 MG capsule Take 1 capsule (300 mg total) by mouth 3 (three) times daily. (Patient taking differently: Take 600 mg by mouth 3 (three) times daily. Takes 2 by mouth 3 times daily)   [DISCONTINUED] irbesartan (AVAPRO) 300 MG tablet Take 300 mg by mouth  daily.   aspirin 81 MG chewable tablet 162 mg. (Patient not taking: Reported on 12/26/2021)   gabapentin (NEURONTIN) 300 MG capsule Take 1 capsule (300 mg total) by mouth 3 (three) times daily.   irbesartan (AVAPRO) 300 MG tablet Take 1 tablet (300 mg total) by mouth daily.   [DISCONTINUED] fish oil-omega-3 fatty acids 1000 MG capsule Take 2 g by mouth daily.   [DISCONTINUED] Misc Natural Products (LUTEIN 20 PO) Take 1 tablet by mouth daily.   [DISCONTINUED] zinc gluconate 50 MG tablet Take 50 mg by mouth daily. (Patient not taking: Reported on  11/28/2021)   No facility-administered encounter medications on file as of 12/26/2021.    Follow-up: Return in about 6 months (around 06/25/2022) for htn.   medicare physical w/Tina in Oct.   Wellington Hampshire, MD

## 2021-12-27 ENCOUNTER — Other Ambulatory Visit: Payer: Self-pay | Admitting: Family Medicine

## 2021-12-27 DIAGNOSIS — E890 Postprocedural hypothyroidism: Secondary | ICD-10-CM

## 2021-12-27 MED ORDER — LEVOTHYROXINE SODIUM 88 MCG PO TABS: 88.0000 ug | ORAL_TABLET | Freq: Every day | ORAL | 1 refills | Status: DC

## 2021-12-31 ENCOUNTER — Encounter: Payer: Self-pay | Admitting: Family Medicine

## 2021-12-31 ENCOUNTER — Telehealth: Payer: Self-pay

## 2021-12-31 NOTE — Telephone Encounter (Signed)
Patient would like her last mamo noted.  States this was completed on 03/29/2021 at [facility].  I will fax for record.

## 2021-12-31 NOTE — Telephone Encounter (Signed)
Mammo updated in chart.

## 2022-01-03 ENCOUNTER — Other Ambulatory Visit: Payer: Self-pay | Admitting: *Deleted

## 2022-01-03 ENCOUNTER — Encounter: Payer: Self-pay | Admitting: Family Medicine

## 2022-01-03 ENCOUNTER — Telehealth: Payer: Self-pay | Admitting: Family Medicine

## 2022-01-03 DIAGNOSIS — E890 Postprocedural hypothyroidism: Secondary | ICD-10-CM

## 2022-01-03 MED ORDER — LEVOTHYROXINE SODIUM 88 MCG PO TABS: 88.0000 ug | ORAL_TABLET | Freq: Every day | ORAL | 1 refills | Status: DC

## 2022-01-03 NOTE — Telephone Encounter (Signed)
Pt states her medication was sent to the wrong pharmacy.   MEDICATION: levothyroxine (SYNTHROID) 88 MCG tablet   PHARMACY: [facility] ([place], [place] Phone:  [phone]  Fax:  [phone]

## 2022-01-03 NOTE — Telephone Encounter (Signed)
Rx sent to correct pharmacy. Patient notified and verbalized understanding.

## 2022-02-26 ENCOUNTER — Ambulatory Visit (INDEPENDENT_AMBULATORY_CARE_PROVIDER_SITE_OTHER): Payer: Medicare Other

## 2022-02-26 DIAGNOSIS — Z0000 Encounter for general adult medical examination without abnormal findings: Secondary | ICD-10-CM | POA: Diagnosis not present

## 2022-02-26 NOTE — Patient Instructions (Addendum)
[name] [name] , ?Thank you for taking time to come for your Medicare Wellness Visit. I appreciate your ongoing commitment to your health goals. Please review the following plan we discussed and let me know if I can assist you in the future.  ? ?Screening recommendations/referrals: ?Colonoscopy: Done 11/28/21 repeat every 3 years  ?Mammogram: Done 03/29/21 repeat every year  ?Bone Density: Done 06/26/20 repeat every 2 years  ?Recommended yearly ophthalmology/optometry visit for glaucoma screening and checkup ?Recommended yearly dental visit for hygiene and checkup ? ?Vaccinations: ?Influenza vaccine: Postponed ?Pneumococcal vaccine: postponed ?Tdap vaccine: postponed  ?Shingles vaccine: Shingrix discussed. Please contact your pharmacy for coverage information.    ?Covid-19:Postponed  ? ?Advanced directives: Please bring a copy of your health care power of attorney and living will to the office at your convenience. ? ?Conditions/risks identified: Take preventive measure for better health and eat right  ? ?Next appointment: Follow up in one year for your annual wellness visit  ? ? ?Preventive Care 79 Years and Older, Female ?Preventive care refers to lifestyle choices and visits with your health care provider that can promote health and wellness. ?What does preventive care include? ?A yearly physical exam. This is also called an annual well check. ?Dental exams once or twice a year. ?Routine eye exams. Ask your health care provider how often you should have your eyes checked. ?Personal lifestyle choices, including: ?Daily care of your teeth and gums. ?Regular physical activity. ?Eating a healthy diet. ?Avoiding tobacco and drug use. ?Limiting alcohol use. ?Practicing safe sex. ?Taking low-dose aspirin every day. ?Taking vitamin and mineral supplements as recommended by your health care provider. ?What happens during an annual well check? ?The services and screenings done by your health care provider during your annual well  check will depend on your age, overall health, lifestyle risk factors, and family history of disease. ?Counseling  ?Your health care provider may ask you questions about your: ?Alcohol use. ?Tobacco use. ?Drug use. ?Emotional well-being. ?Home and relationship well-being. ?Sexual activity. ?Eating habits. ?History of falls. ?Memory and ability to understand (cognition). ?Work and work Statistician. ?Reproductive health. ?Screening  ?You may have the following tests or measurements: ?Height, weight, and BMI. ?Blood pressure. ?Lipid and cholesterol levels. These may be checked every 5 years, or more frequently if you are over 64 years old. ?Skin check. ?Lung cancer screening. You may have this screening every year starting at age 4 if you have a 30-pack-year history of smoking and currently smoke or have quit within the past 15 years. ?Fecal occult blood test (FOBT) of the stool. You may have this test every year starting at age 29. ?Flexible sigmoidoscopy or colonoscopy. You may have a sigmoidoscopy every 5 years or a colonoscopy every 10 years starting at age 66. ?Hepatitis C blood test. ?Hepatitis B blood test. ?Sexually transmitted disease (STD) testing. ?Diabetes screening. This is done by checking your blood sugar (glucose) after you have not eaten for a while (fasting). You may have this done every 1-3 years. ?Bone density scan. This is done to screen for osteoporosis. You may have this done starting at age 5. ?Mammogram. This may be done every 1-2 years. Talk to your health care provider about how often you should have regular mammograms. ?Talk with your health care provider about your test results, treatment options, and if necessary, the need for more tests. ?Vaccines  ?Your health care provider may recommend certain vaccines, such as: ?Influenza vaccine. This is recommended every year. ?Tetanus, diphtheria, and acellular pertussis (  Tdap, Td) vaccine. You may need a Td booster every 10 years. ?Zoster  vaccine. You may need this after age 49. ?Pneumococcal 13-valent conjugate (PCV13) vaccine. One dose is recommended after age 41. ?Pneumococcal polysaccharide (PPSV23) vaccine. One dose is recommended after age 60. ?Talk to your health care provider about which screenings and vaccines you need and how often you need them. ?This information is not intended to replace advice given to you by your health care provider. Make sure you discuss any questions you have with your health care provider. ?Document Released: 12/08/2015 Document Revised: 07/31/2016 Document Reviewed: 09/12/2015 ?Elsevier Interactive Patient Education ? 2017 [place]. ? ?Fall Prevention in the Home ?Falls can cause injuries. They can happen to people of all ages. There are many things you can do to make your home safe and to help prevent falls. ?What can I do on the outside of my home? ?Regularly fix the edges of walkways and driveways and fix any cracks. ?Remove anything that might make you trip as you walk through a door, such as a raised step or threshold. ?Trim any bushes or trees on the path to your home. ?Use bright outdoor lighting. ?Clear any walking paths of anything that might make someone trip, such as rocks or tools. ?Regularly check to see if handrails are loose or broken. Make sure that both sides of any steps have handrails. ?Any raised decks and porches should have guardrails on the edges. ?Have any leaves, snow, or ice cleared regularly. ?Use sand or salt on walking paths during winter. ?Clean up any spills in your garage right away. This includes oil or grease spills. ?What can I do in the bathroom? ?Use night lights. ?Install grab bars by the toilet and in the tub and shower. Do not use towel bars as grab bars. ?Use non-skid mats or decals in the tub or shower. ?If you need to sit down in the shower, use a plastic, non-slip stool. ?Keep the floor dry. Clean up any water that spills on the floor as soon as it happens. ?Remove  soap buildup in the tub or shower regularly. ?Attach bath mats securely with double-sided non-slip rug tape. ?Do not have throw rugs and other things on the floor that can make you trip. ?What can I do in the bedroom? ?Use night lights. ?Make sure that you have a light by your bed that is easy to reach. ?Do not use any sheets or blankets that are too big for your bed. They should not hang down onto the floor. ?Have a firm chair that has side arms. You can use this for support while you get dressed. ?Do not have throw rugs and other things on the floor that can make you trip. ?What can I do in the kitchen? ?Clean up any spills right away. ?Avoid walking on wet floors. ?Keep items that you use a lot in easy-to-reach places. ?If you need to reach something above you, use a strong step stool that has a grab bar. ?Keep electrical cords out of the way. ?Do not use floor polish or wax that makes floors slippery. If you must use wax, use non-skid floor wax. ?Do not have throw rugs and other things on the floor that can make you trip. ?What can I do with my stairs? ?Do not leave any items on the stairs. ?Make sure that there are handrails on both sides of the stairs and use them. Fix handrails that are broken or loose. Make sure that handrails are  as long as the stairways. ?Check any carpeting to make sure that it is firmly attached to the stairs. Fix any carpet that is loose or worn. ?Avoid having throw rugs at the top or bottom of the stairs. If you do have throw rugs, attach them to the floor with carpet tape. ?Make sure that you have a light switch at the top of the stairs and the bottom of the stairs. If you do not have them, ask someone to add them for you. ?What else can I do to help prevent falls? ?Wear shoes that: ?Do not have high heels. ?Have rubber bottoms. ?Are comfortable and fit you well. ?Are closed at the toe. Do not wear sandals. ?If you use a stepladder: ?Make sure that it is fully opened. Do not climb a  closed stepladder. ?Make sure that both sides of the stepladder are locked into place. ?Ask someone to hold it for you, if possible. ?Clearly mark and make sure that you can see: ?Any grab bars or handrails. ?Mayer Masker

## 2022-02-26 NOTE — Progress Notes (Signed)
Virtual Visit via Telephone Note ? ?I connected with  Mikeya Tomasetti [name] on 02/26/22 at 11:00 AM EDT by telephone and verified that I am speaking with the correct person using two identifiers. ? ?Medicare Annual Wellness visit completed telephonically due to Covid-19 pandemic.  ? ?Persons participating in this call: This Health Coach and this patient.  ? ?Location: ?Patient: Home ?Provider: Office ?  ?I discussed the limitations, risks, security and privacy concerns of performing an evaluation and management service by telephone and the availability of in person appointments. The patient expressed understanding and agreed to proceed. ? ?Unable to perform video visit due to video visit attempted and failed and/or patient does not have video capability.  ? ?Some vital signs may be absent or patient reported.  ? ?Willette Brace, LPN ? ? ?Subjective:  ? [name] [name] is a 76 y.o. female who presents for an Initial Medicare Annual Wellness Visit. ? ?Review of Systems    ? ?Cardiac Risk Factors include: advanced age (>78mn, >>95women);hypertension ? ?   ?Objective:  ?  ?There were no vitals filed for this visit. ?There is no height or weight on file to calculate BMI. ? ? ?  02/26/2022  ? 11:11 AM 01/02/2021  ?  3:49 PM 12/27/2020  ?  9:54 AM 12/12/2020  ? 10:15 AM 11/08/2014  ? 10:48 AM 05/04/2013  ? 10:56 AM 04/27/2013  ?  9:27 AM  ?Advanced Directives  ?Does Patient Have a Medical Advance Directive? Yes No;Yes No;Yes Yes Yes Patient has advance directive, copy not in chart Patient has advance directive, copy not in chart  ?Type of AParamedicof [place]Living will [place]Living will [place] will Healthcare Power of [place]  ?Does patient want to make changes to medical advance directive?  No - Patient declined       ?Copy of [place] Ranch Estates[place] Chart? No - copy requested    No  - copy requested Copy requested from family   ?Would patient like information on creating a medical advance directive?  No - Patient declined       ? ? ?Current Medications (verified) ?Outpatient Encounter Medications as of 02/26/2022  ?Medication Sig  ? aspirin 81 MG chewable tablet 162 mg 2 (two) times a week.  ? BIOTIN PO Take 1 capsule by mouth daily.  ? Biotin w/ Vitamins C & E (HAIR/SKIN/NAILS PO) Take by mouth.  ? dorzolamide-timolol (COSOPT) 22.3-6.8 MG/ML ophthalmic solution Place 1 drop into both eyes 2 (two) times daily.   ? gabapentin (NEURONTIN) 300 MG capsule Take 1 capsule (300 mg total) by mouth 3 (three) times daily.  ? irbesartan (AVAPRO) 300 MG tablet Take 1 tablet (300 mg total) by mouth daily.  ? latanoprost (XALATAN) 0.005 % ophthalmic solution Place 1 drop into the left eye at bedtime.  ? levothyroxine (SYNTHROID) 88 MCG tablet Take 1 tablet (88 mcg total) by mouth daily.  ? Multiple Vitamin (MULTIVITAMIN ADULT) TABS Take 1 tablet by mouth daily.  ? polyvinyl alcohol (LIQUIFILM TEARS) 1.4 % ophthalmic solution Place 1 drop into both eyes daily as needed (for dry eyes).  ? rosuvastatin (CRESTOR) 5 MG tablet Take 1 tablet (5 mg total) by mouth 2 (two) times a week.  ? ?No facility-administered encounter medications on file as of 02/26/2022.  ? ? ?Allergies (verified) ?Erythromycin, Ezetimibe, Other, and Statins  ? ?History: ?Past Medical History:  ?  Diagnosis Date  ? Abnormal Pap smear   ? Abnormal peripheral vision   ? RT EYE  ? Allergy   ? Anemia   ? Arthritis   ? back and neck  ? Breast cancer ([place]) 1997  ? right breast  ? Bruises easily   ? Cataracts, bilateral   ? Chronic back pain   ? Chronic neck pain   ? Complication of anesthesia   ? slow to wake up  ? Family history of adverse reaction to anesthesia   ? Dad takes a long time to wake up  ? Frequency of urination   ? GERD (gastroesophageal reflux disease)   ? Glaucoma   ? Goiter   ? High cholesterol   ? History of postoperative nausea and  vomiting   ? Hypertension   ? Hypothyroidism   ? Neuromuscular disorder ([place])   ? Numbness   ? hands / feet /arms / legs - followed by Dr. Georgann Housekeeper - [place]  ? PONV (postoperative nausea and vomiting)   ? Post-menopausal   ? Sexual dysfunction   ? Shortness of breath   ? with exertion and Wt gain  ? Urinary incontinence   ? ?Past Surgical History:  ?Procedure Laterality Date  ? BIOPSY THYROID    ? BREAST SURGERY Right   ? lymph node removed  ? CATARACT EXTRACTION, BILATERAL  3009,2330  ? COLONOSCOPY  02/15/2014  ? Dr.Stark  ? EYE SURGERY  08/2012  ? detached retina surg  ? LAPAROSCOPY  1978  ? BTL  ? lumpectomy  1997  ? Rt  ? neck fusion  2010  ? THYROIDECTOMY N/A 05/04/2013  ? Procedure:  TOTAL THYROIDECTOMY;  Surgeon: Earnstine Regal, MD;  Location: [facility];  Service: General;  Laterality: N/A;  ? TOTAL HIP ARTHROPLASTY Right 01/02/2021  ? Procedure: TOTAL HIP ARTHROPLASTY ANTERIOR APPROACH;  Surgeon: Paralee Cancel, MD;  Location: [facility];  Service: Orthopedics;  Laterality: Right;  70 mins  ? TUBAL LIGATION    ? ?Family History  ?Problem Relation Age of Onset  ? Hyperlipidemia Mother   ? Heart disease Mother   ? Diabetes Mother   ? Hypertension Mother   ? Hyperlipidemia Father   ? Colon cancer Father 72  ? Hearing loss Sister   ? Hyperlipidemia Sister   ? Heart disease Sister   ? Heart disease Sister   ? Hyperlipidemia Sister   ? Stroke Sister   ? Hypertension Sister   ? Thyroid disease Sister   ? Hyperlipidemia Brother   ? Drug abuse Brother   ? Early death Brother   ? Esophageal cancer Brother 30  ?     smoker  ? Diabetes Brother   ? Heart disease Brother   ? Hypertension Brother   ? Heart attack Brother   ? Early death Brother   ? Depression Daughter   ? Cervical cancer Daughter   ?     dx in her 11s  ? Colon cancer Paternal Aunt 63  ? Heart disease Maternal Grandmother   ? Early death Maternal Grandmother   ? Diabetes Maternal Grandmother   ? Asthma Maternal Grandmother   ? Diabetes Paternal Grandmother   ?  Hypertension Paternal Grandmother   ? Heart disease Paternal Grandmother   ? Breast cancer Paternal Grandmother   ?     dx in her 41s  ? Heart disease Paternal Grandfather   ? Thyroid disease Other 45  ?     niece  ? ?  Social History  ? ?Socioeconomic History  ? Marital status: Single  ?  Spouse name: Not on file  ? Number of children: 3  ? Years of education: 23  ? Highest education level: Not on file  ?Occupational History  ? Occupation: retired  ?Tobacco Use  ? Smoking status: Former  ?  Packs/day: 0.50  ?  Years: 3.00  ?  Pack years: 1.50  ?  Types: Cigarettes  ?  Start date: 01/24/1966  ?  Quit date: 04/27/1968  ?  Years since quitting: 53.8  ? Smokeless tobacco: Never  ? Tobacco comments:  ?  quit over 40 years ago  ?Vaping Use  ? Vaping Use: Never used  ?Substance and Sexual Activity  ? Alcohol use: Yes  ?  Comment: 1 every other month  ? Drug use: No  ? Sexual activity: Not Currently  ?Other Topics Concern  ? Not on file  ?Social History Narrative  ? Patient is single and lives alone.Patient has 3 children and 1 step daughter. 5 grands, +3 steps,  6 great grands(step). 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  ? ?Social Determinants of Health  ? ?Financial Resource Strain: Low Risk   ? Difficulty of Paying Living Expenses: Not hard at all  ?Food Insecurity: No Food Insecurity  ? Worried About Charity fundraiser in the Last Year: Never true  ? Ran Out of Food in the Last Year: Never true  ?Transportation Needs: No Transportation Needs  ? Lack of Transportation (Medical): No  ? Lack of Transportation (Non-Medical): No  ?Physical Activity: Sufficiently Active  ? Days of Exercise per Week: 3 days  ? Minutes of Exercise per Session: 60 min  ?Stress: No Stress Concern Present  ? Feeling of Stress : Not at all  ?Social Connections: Moderately Integrated  ? Frequency of Communication with Friends and Family:  More than three times a week  ? Frequency of Social Gatherings with Friends and Family: More than three times a week  ? Attends Religious Services: 1 to 4 times per year  ? Active Member of [employer] or [place]

## 2022-03-27 LAB — HM MAMMOGRAPHY

## 2022-04-03 ENCOUNTER — Telehealth: Payer: Self-pay | Admitting: Family Medicine

## 2022-04-03 NOTE — Telephone Encounter (Signed)
.. ?  Encourage patient to contact the pharmacy for refills or they can request refills through [facility] ? ?LAST APPOINTMENT DATE:  Please schedule appointment if longer than 1 year ? ?NEXT APPOINTMENT DATE: ? ?MEDICATION:levothyroxine (SYNTHROID) 88 MCG tablet ? ?Is the patient out of medication?  yes ? ?PHARMACY:  ? ?Let patient know to contact pharmacy at the end of the day to make sure medication is ready. ? ?Please notify patient to allow 48-72 hours to process  ? ? ?Note: Endo doctor was not happy having a different md changing her synthroid.   ?Patient will drop off lab work. Patient wants to stay under Dr Gertie Fey care and fo with endo. Stated she has a new endo apt on steptember.  ?

## 2022-04-04 ENCOUNTER — Other Ambulatory Visit: Payer: Self-pay | Admitting: *Deleted

## 2022-04-04 ENCOUNTER — Encounter: Payer: Self-pay | Admitting: Family Medicine

## 2022-04-04 DIAGNOSIS — E890 Postprocedural hypothyroidism: Secondary | ICD-10-CM

## 2022-04-04 MED ORDER — LEVOTHYROXINE SODIUM 88 MCG PO TABS: 88.0000 ug | ORAL_TABLET | Freq: Every day | ORAL | 1 refills | Status: DC

## 2022-04-04 NOTE — Telephone Encounter (Signed)
Rx sent to pharmacy as requested.

## 2022-04-08 MED ORDER — LEVOTHYROXINE SODIUM 88 MCG PO TABS: 88.0000 ug | ORAL_TABLET | Freq: Every day | ORAL | 1 refills | Status: DC

## 2022-04-08 NOTE — Addendum Note (Signed)
Addended by: Zacarias Pontes on: 04/08/2022 11:29 AM ? ? Modules accepted: Orders ? ?

## 2022-04-08 NOTE — Telephone Encounter (Signed)
Rx sent to mail order pharmacy as requested. ?

## 2022-04-08 NOTE — Telephone Encounter (Signed)
Patient is calling back stating we sent script to incorrect pharmacy.  She is requesting [facility].    States the only time she will ever use [facility] is if she needed a script right away.   Patient is requesting script to be rerouted.

## 2022-04-18 IMAGING — CT CT CARDIAC CORONARY ARTERY CALCIUM SCORE
1 series · 1 of 1 positions shown · non-contrast
Comparison: None.
COMPARISON: None.

Addendum:
EXAM:
OVER-READ INTERPRETATION  CT CHEST

The following report is an over-read performed by radiologist Dr.
Iara Cee [REDACTED] on 11/13/2021. This
over-read does not include interpretation of cardiac or coronary
anatomy or pathology. The coronary calcium score interpretation by
the cardiologist is attached.
CLINICAL DATA: Risk stratification: 75 Year-old White Female
Coronary Calcium Score
TECHNIQUE: The patient was scanned on a Siemens Force scanner. Axial
non-contrast 3 mm slices were carried out through the heart. The
data set was analyzed on a dedicated work station and scored using
the Agatson method.

[Series 781: — · 0.42mm/px · 1 of 1 slices shown]
[im 1/1]
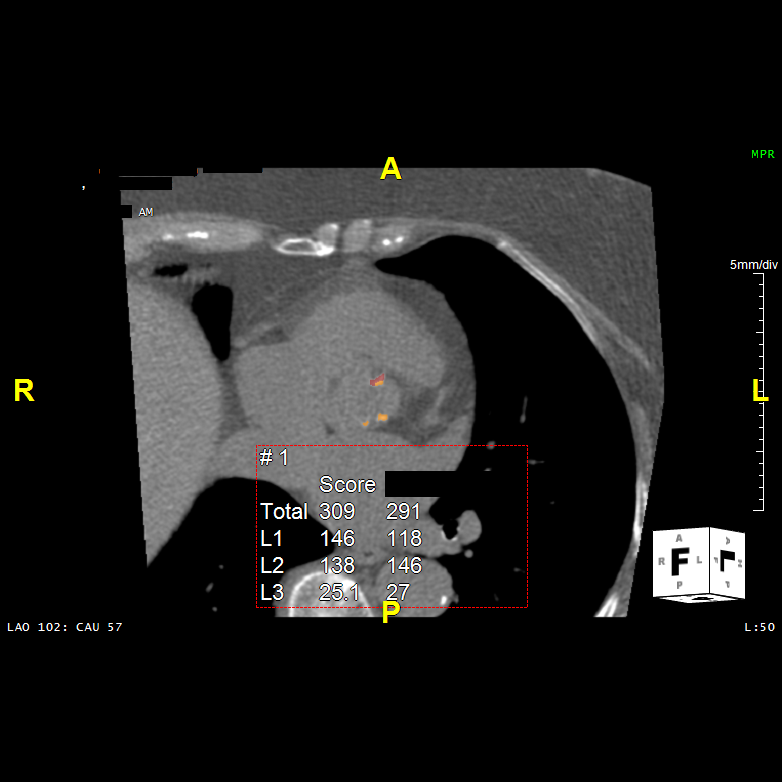

[1 of 1 positions shown; findings below may reference images not displayed]

FINDINGS: Vascular: Scattered calcifications in the aorta. No aneurysm. Heart
is normal size.

Mediastinum/Nodes: No adenopathy

Lungs/Pleura: No confluent airspace opacity or effusion.

Upper Abdomen: Imaging into the upper abdomen demonstrates no acute
findings.

Musculoskeletal: Chest wall soft tissues are unremarkable. No acute
bony abnormality.
IMPRESSION: Aortic atherosclerosis.

No acute extra cardiac abnormality.
FINDINGS: Non-cardiac: See separate report from [REDACTED].

Ascending Aorta: Normal caliber.  Aortic atherosclerosis.

Aortic Valve calcium score 309.

Pericardium: Normal.

Coronary arteries: Normal origins.

Coronary Calcium Score:

Left main: 0

Left anterior descending artery: 0

Left circumflex artery: 0

Right coronary artery: 1

Total: 1

Percentile: 1st for age, sex, and race matched control.
IMPRESSION: 1. Coronary calcium score of 1. This was 1st percentile for age,
gender, and race matched controls.

2. Aortic Valve calcium score 309.

3.  Aortic atherosclerosis.

RECOMMENDATIONS:

The proposed cut-off value of 1,651 GONDAL yielded a 93 % sensitivity
and 75 % specificity in grading AS severity in patients with
classical low-flow, low-gradient AS. Proposed different cut-off
values to define severe AS for men and women as 2,065 GONDAL and 1,274
GONDAL, respectively. The joint European and American recommendations
for the assessment of AS consider the aortic valve calcium score as
a continuum - a very high calcium score suggests severe AS and a low
calcium score suggests severe AS is unlikely.

Atie, Onadeko, et al. 0348 ESC/EACTS Guidelines for
the management of valvular heart disease. Eur Heart J
0348;[DATE].



If CAC = 0, it is reasonable to withhold statin therapy and reassess
in 5 to 10 years, as long as higher risk conditions are absent
(diabetes mellitus, family history of premature CHD in first degree
relatives (males <55 years; females <65 years), cigarette smoking,
LDL >=190 mg/dL or other independent risk factors).

If CAC is 1 to 99, it is reasonable to initiate statin therapy for
patients >=55 years of age.

If CAC is >=100 or >=75th percentile, it is reasonable to initiate
statin therapy at any age.

Cardiology referral should be considered for patients with CAC
scores =400 or >=75th percentile.

*9442 AHA/ACC/AACVPR/AAPA/ABC/OABITSA DOROTHY/MACAO/BOHACEK/[place]/EVGENY/OLEG/II
Guideline on the Management of Blood Cholesterol: A Report of the
American College of Cardiology/American Heart Association Task Force
on Clinical Practice Guidelines. J Am Coll Cardiol.
4410;73(24):9452-9017.

*** End of Addendum ***
EXAM:
OVER-READ INTERPRETATION  CT CHEST

The following report is an over-read performed by radiologist Dr.
Iara Cee [REDACTED] on 11/13/2021. This
over-read does not include interpretation of cardiac or coronary
anatomy or pathology. The coronary calcium score interpretation by
the cardiologist is attached.
FINDINGS: Vascular: Scattered calcifications in the aorta. No aneurysm. Heart
is normal size.

Mediastinum/Nodes: No adenopathy

Lungs/Pleura: No confluent airspace opacity or effusion.

Upper Abdomen: Imaging into the upper abdomen demonstrates no acute
findings.

Musculoskeletal: Chest wall soft tissues are unremarkable. No acute
bony abnormality.
IMPRESSION: Aortic atherosclerosis.

No acute extra cardiac abnormality.

## 2022-06-26 ENCOUNTER — Ambulatory Visit: Payer: Medicare Other | Admitting: Family Medicine

## 2022-06-26 ENCOUNTER — Encounter: Payer: Self-pay | Admitting: Family Medicine

## 2022-06-26 VITALS — BP 120/82 | HR 44 | Temp 97.5°F | Ht 64.0 in | Wt 169.2 lb

## 2022-06-26 DIAGNOSIS — R7303 Prediabetes: Secondary | ICD-10-CM

## 2022-06-26 DIAGNOSIS — I10 Essential (primary) hypertension: Secondary | ICD-10-CM

## 2022-06-26 DIAGNOSIS — Z13820 Encounter for screening for osteoporosis: Secondary | ICD-10-CM

## 2022-06-26 DIAGNOSIS — E890 Postprocedural hypothyroidism: Secondary | ICD-10-CM | POA: Diagnosis not present

## 2022-06-26 DIAGNOSIS — E7801 Familial hypercholesterolemia: Secondary | ICD-10-CM | POA: Diagnosis not present

## 2022-06-26 DIAGNOSIS — I700 Atherosclerosis of aorta: Secondary | ICD-10-CM | POA: Diagnosis not present

## 2022-06-26 DIAGNOSIS — G8929 Other chronic pain: Secondary | ICD-10-CM

## 2022-06-26 LAB — LIPID PANEL
Cholesterol: 286 mg/dL — ABNORMAL HIGH (ref 0–200)
HDL: 54.3 mg/dL (ref >39.00)
LDL Cholesterol: 210 mg/dL — ABNORMAL HIGH (ref 0–99)
NonHDL: 231.74
Total CHOL/HDL Ratio: 5
Triglycerides: 109 mg/dL (ref 0.0–149.0)
VLDL: 21.8 mg/dL (ref 0.0–40.0)

## 2022-06-26 LAB — CBC WITH DIFFERENTIAL/PLATELET
Basophils Absolute: 0 10*3/uL (ref 0.0–0.1)
Basophils Relative: 0.6 % (ref 0.0–3.0)
Eosinophils Absolute: 0.1 10*3/uL (ref 0.0–0.7)
Eosinophils Relative: 1.5 % (ref 0.0–5.0)
HCT: 40 % (ref 36.0–46.0)
Hemoglobin: 13.4 g/dL (ref 12.0–15.0)
Lymphocytes Relative: 30.4 % (ref 12.0–46.0)
Lymphs Abs: 2.2 10*3/uL (ref 0.7–4.0)
MCHC: 33.5 g/dL (ref 30.0–36.0)
MCV: 92.7 fl (ref 78.0–100.0)
Monocytes Absolute: 0.5 10*3/uL (ref 0.1–1.0)
Monocytes Relative: 7.3 % (ref 3.0–12.0)
Neutro Abs: 4.3 10*3/uL (ref 1.4–7.7)
Neutrophils Relative %: 60.2 % (ref 43.0–77.0)
Platelets: 254 10*3/uL (ref 150.0–400.0)
RBC: 4.32 Mil/uL (ref 3.87–5.11)
RDW: 13.4 % (ref 11.5–15.5)
WBC: 7.2 10*3/uL (ref 4.0–10.5)

## 2022-06-26 LAB — COMPREHENSIVE METABOLIC PANEL
ALT: 14 U/L (ref 0–35)
AST: 15 U/L (ref 0–37)
Albumin: 4.5 g/dL (ref 3.5–5.2)
Alkaline Phosphatase: 77 U/L (ref 39–117)
BUN: 15 mg/dL (ref 6–23)
CO2: 22 mEq/L (ref 19–32)
Calcium: 9.2 mg/dL (ref 8.4–10.5)
Chloride: 100 mEq/L (ref 96–112)
Creatinine, Ser: 0.67 mg/dL (ref 0.40–1.20)
GFR: 85.57 mL/min (ref >60.00)
Glucose, Bld: 80 mg/dL (ref 70–99)
Potassium: 4 mEq/L (ref 3.5–5.1)
Sodium: 135 mEq/L (ref 135–145)
Total Bilirubin: 0.4 mg/dL (ref 0.2–1.2)
Total Protein: 7.2 g/dL (ref 6.0–8.3)

## 2022-06-26 LAB — TSH: TSH: 0.16 u[IU]/mL — ABNORMAL LOW (ref 0.35–5.50)

## 2022-06-26 LAB — HEMOGLOBIN A1C: Hgb A1c MFr Bld: 5.9 % (ref 4.6–6.5)

## 2022-06-26 LAB — T4, FREE: Free T4: 1.17 ng/dL (ref 0.60–1.60)

## 2022-06-26 MED ORDER — LEVOTHYROXINE SODIUM 88 MCG PO TABS: 88.0000 ug | ORAL_TABLET | Freq: Every day | ORAL | 1 refills | Status: DC

## 2022-06-26 NOTE — Progress Notes (Signed)
Subjective:     Patient ID: [name] [name], female    DOB: Mar 24, 1946, 76 y.o.   MRN: [id number]  Chief Complaint  Patient presents with   Follow-up    6 month follow-up HTN  Fasting     HPI  HTN-Pt is on avapro.  Bp's running 120-130/82.  Occ 140.  No ha/cp/palp/edema/cough/sob.  Occ dizziness.  Does line-dancing.  Doesn't drink a lot of water.  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 Chalmers Cater endo-will see new one next mo. Hair is better on the 88 dose. Dr. Rayetta Humphrey doc) ordered DXA but cancelled as xfer here. Uses [facility]-working on diet/exercise HLD-on crestor twice weekly as intol statins but then couldn't find it.  Chronic neck/back pain-gabapentin holding  Health Maintenance Due  Topic Date Due   Zoster Vaccines- Shingrix (1 of 2) Never done    Past Medical History:  Diagnosis Date   Abnormal Pap smear    Abnormal peripheral vision    RT EYE   Allergy    Anemia    Arthritis    back and neck   Breast cancer ([place]) 1997   right breast   Bruises easily    Cataracts, bilateral    Chronic back pain    Chronic neck pain    Complication of anesthesia    slow to wake up   Family history of adverse reaction to anesthesia    Dad takes a long time to wake up   Frequency of urination    GERD (gastroesophageal reflux disease)    Glaucoma    Goiter    High cholesterol    History of postoperative nausea and vomiting    Hypertension    Hypothyroidism    Neuromuscular disorder (HCC)    Numbness    hands / feet /arms / legs - followed by Dr. Georgann Housekeeper - [place]   PONV (postoperative nausea and vomiting)    Post-menopausal    Sexual dysfunction    Shortness of breath    with exertion and Wt gain   Urinary incontinence     Past Surgical History:  Procedure Laterality Date   BIOPSY THYROID     BREAST SURGERY Right    lymph node removed   CATARACT EXTRACTION, BILATERAL  2009,2010   COLONOSCOPY  02/15/2014   Dr.Stark   EYE  SURGERY  08/2012   detached retina surg   LAPAROSCOPY  1978   BTL   lumpectomy  1997   Rt   neck fusion  2010   THYROIDECTOMY N/A 05/04/2013   Procedure:  TOTAL THYROIDECTOMY;  Surgeon: Earnstine Regal, MD;  Location: [facility];  Service: General;  Laterality: N/A;   TOTAL HIP ARTHROPLASTY Right 01/02/2021   Procedure: TOTAL HIP ARTHROPLASTY ANTERIOR APPROACH;  Surgeon: Paralee Cancel, MD;  Location: [facility];  Service: Orthopedics;  Laterality: Right;  70 mins   TUBAL LIGATION      Outpatient Medications Prior to Visit  Medication Sig Dispense Refill   aspirin 81 MG chewable tablet 162 mg 2 (two) times a week.     BIOTIN PO Take 1 capsule by mouth daily.     Biotin w/ Vitamins C & E (HAIR/SKIN/NAILS PO) Take by mouth.     dorzolamide-timolol (COSOPT) 22.3-6.8 MG/ML ophthalmic solution Place 1 drop into both eyes 2 (two) times daily.      gabapentin (NEURONTIN) 300 MG capsule Take 1 capsule (300 mg total) by mouth 3 (three) times daily. 270 capsule 3   irbesartan (  AVAPRO) 300 MG tablet Take 1 tablet (300 mg total) by mouth daily. 90 tablet 3   latanoprost (XALATAN) 0.005 % ophthalmic solution Place 1 drop into the left eye at bedtime.     levothyroxine (SYNTHROID) 88 MCG tablet Take 1 tablet (88 mcg total) by mouth daily. 90 tablet 1   Multiple Vitamin (MULTIVITAMIN ADULT) TABS Take 1 tablet by mouth daily.     polyvinyl alcohol (LIQUIFILM TEARS) 1.4 % ophthalmic solution Place 1 drop into both eyes daily as needed (for dry eyes).     rosuvastatin (CRESTOR) 5 MG tablet Take 1 tablet (5 mg total) by mouth 2 (two) times a week. 30 tablet 3   No facility-administered medications prior to visit.    Allergies  Allergen Reactions   Erythromycin Nausea Only    Other reaction(s): Unknown   Ezetimibe     Other reaction(s): myalgias   Other Itching    Unknown Dye PET scan dye possibly= itching   Statins     Other reaction(s): myalgias   ROS neg/noncontributory except as noted HPI/below       Objective:     BP 120/82   Pulse (!) 44   Temp (!) 97.5 F (36.4 C) (Temporal)   Ht '5\' 4"'$  (1.626 m)   Wt 169 lb 4 oz (76.8 kg)   SpO2 98%   BMI 29.05 kg/m  Wt Readings from Last 3 Encounters:  06/26/22 169 lb 4 oz (76.8 kg)  12/26/21 174 lb 6 oz (79.1 kg)  11/28/21 176 lb (79.8 kg)    Physical Exam   Gen: WDWN NAD HEENT: NCAT, conjunctiva not injected, sclera nonicteric NECK:  supple, no thyromegaly, no nodes, no carotid bruits CARDIAC: RRR, S1S2+, soft 1/6 murmur. DP 2+B LUNGS: CTAB. No wheezes ABDOMEN:  BS+, soft, NTND, No HSM, no masses EXT:  no edema MSK: no gross abnormalities.  NEURO: A&O x3.  CN II-XII intact.  PSYCH: normal mood. Good eye contact     Assessment & Plan:   Problem List Items Addressed This Visit       Cardiovascular and Mediastinum   HTN (hypertension) - Primary   Relevant Orders   Comprehensive metabolic panel   Hemoglobin A1c   CBC with Differential/Platelet   Aortic atherosclerosis (HCC)   Relevant Orders   Hemoglobin A1c   Lipid panel     Endocrine   Hypothyroidism   Relevant Orders   TSH   T4, free     Other   Chronic pain   Familial hypercholesterolemia   Relevant Orders   Lipid panel   Prediabetes   HTN-chronic. Controlled.  Cont avapro.  Check cmp a1c, cmp Post surg hypothyroidism-chronic. Stable on synthroid 88.  Cont.  Check tsh/Ft4.  Seeing endo in Sept preDM-work on diet/exercise.  Chek A1C HLD/aortic athersclerosis-chronic.  Lost rosuvastatin so off.  Check lipids-advised call pharm. Chronic pain-stable on gabapentin.  Cont.  F/u 6 mo  No orders of the defined types were placed in this encounter.   Wellington Hampshire, MD

## 2022-06-26 NOTE — Patient Instructions (Signed)
It was very nice to see you today!  Drink more water Don't take biotin 3 days prior to labs   PLEASE NOTE:  If you had any lab tests please let us know if you have not heard back within a few days. You may see your results on MyChart before we have a chance to review them but we will give you a call once they are reviewed by [place]. If we ordered any referrals today, please let us know if you have not heard from their office within the next week.   Please try these tips to maintain a healthy lifestyle:  Eat most of your calories during the day when you are active. Eliminate processed foods including packaged sweets (pies, cakes, cookies), reduce intake of potatoes, white bread, white pasta, and white rice. Look for whole grain options, oat flour or almond flour.  Each meal should contain half fruits/vegetables, one quarter protein, and one quarter carbs (no bigger than a computer mouse).  Cut down on sweet beverages. This includes juice, soda, and sweet tea. Also watch fruit intake, though this is a healthier sweet option, it still contains natural sugar! Limit to 3 servings daily.  Drink at least 1 glass of water with each meal and aim for at least 8 glasses per day  Exercise at least 150 minutes every week.

## 2022-06-27 ENCOUNTER — Telehealth: Payer: Self-pay | Admitting: Family Medicine

## 2022-06-27 NOTE — Telephone Encounter (Signed)
Patient returned call. Patient requests to be called at ph# [phone].

## 2022-07-03 ENCOUNTER — Encounter: Payer: Self-pay | Admitting: Gastroenterology

## 2022-07-08 ENCOUNTER — Telehealth: Payer: Self-pay | Admitting: Family Medicine

## 2022-07-08 ENCOUNTER — Other Ambulatory Visit: Payer: Self-pay | Admitting: *Deleted

## 2022-07-08 DIAGNOSIS — Z13820 Encounter for screening for osteoporosis: Secondary | ICD-10-CM

## 2022-07-08 NOTE — Telephone Encounter (Signed)
Order placed

## 2022-07-08 NOTE — Telephone Encounter (Signed)
Patient requests Order for Bone Density Scan be sent to [facility] Fax# [phone]

## 2022-07-17 LAB — HM DEXA SCAN

## 2022-07-19 ENCOUNTER — Encounter: Payer: Self-pay | Admitting: Family Medicine

## 2022-08-14 LAB — BASIC METABOLIC PANEL: Glucose: 80

## 2022-08-14 LAB — TSH: TSH: 1.01 (ref 0.41–5.90)

## 2022-08-23 ENCOUNTER — Ambulatory Visit: Payer: Medicare Other | Attending: Internal Medicine | Admitting: Physical Therapy

## 2022-08-23 DIAGNOSIS — M5412 Radiculopathy, cervical region: Secondary | ICD-10-CM | POA: Diagnosis present

## 2022-08-23 DIAGNOSIS — M5459 Other low back pain: Secondary | ICD-10-CM | POA: Insufficient documentation

## 2022-08-23 DIAGNOSIS — R2689 Other abnormalities of gait and mobility: Secondary | ICD-10-CM | POA: Diagnosis present

## 2022-08-23 DIAGNOSIS — R293 Abnormal posture: Secondary | ICD-10-CM | POA: Diagnosis present

## 2022-08-23 NOTE — Therapy (Signed)
OUTPATIENT PHYSICAL THERAPY NEURO EVALUATION   Patient Name: [name] [name] MRN: [id number] DOB:01-26-1946, 76 y.o., female Today's Date: 08/23/2022   PCP: Tawnya Crook, MD REFERRING PROVIDER: Delrae Rend, MD    PT End of Session - 08/23/22 [phone]     Visit Number 1    Number of Visits 13   with eval   Date for PT Re-Evaluation 10/18/22   to allow for scheduling conflicts   Authorization Type [employer] Medicare    Progress Note Due on Visit 10    PT Start Time 0930    PT Stop Time 1015    PT Time Calculation (min) 45 min    Activity Tolerance Patient tolerated treatment well    Behavior During Therapy [facility] for tasks assessed/performed             Past Medical History:  Diagnosis Date   Abnormal Pap smear    Abnormal peripheral vision    RT EYE   Allergy    Anemia    Arthritis    back and neck   Breast cancer ([place]) 1997   right breast   Bruises easily    Cataracts, bilateral    Chronic back pain    Chronic neck pain    Complication of anesthesia    slow to wake up   Family history of adverse reaction to anesthesia    Dad takes a long time to wake up   Frequency of urination    GERD (gastroesophageal reflux disease)    Glaucoma    Goiter    High cholesterol    History of postoperative nausea and vomiting    Hypertension    Hypothyroidism    Neuromuscular disorder (HCC)    Numbness    hands / feet /arms / legs - followed [place] Dr. Georgann Housekeeper [place]   PONV (postoperative nausea and vomiting)    Post-menopausal    Sexual dysfunction    Shortness of breath    with exertion and Wt gain   Urinary incontinence    Past Surgical History:  Procedure Laterality Date   BIOPSY THYROID     BREAST SURGERY Right    lymph node removed   CATARACT EXTRACTION, BILATERAL  2009,2010   COLONOSCOPY  02/15/2014   Dr.Stark   EYE SURGERY  08/2012   detached retina surg   LAPAROSCOPY  1978   BTL   lumpectomy  1997   Rt   neck fusion  2010   THYROIDECTOMY N/A  05/04/2013   Procedure:  TOTAL THYROIDECTOMY;  Surgeon: Earnstine Regal, MD;  Location: [facility];  Service: General;  Laterality: N/A;   TOTAL HIP ARTHROPLASTY Right 01/02/2021   Procedure: TOTAL HIP ARTHROPLASTY ANTERIOR APPROACH;  Surgeon: Paralee Cancel, MD;  Location: [facility];  Service: Orthopedics;  Laterality: Right;  70 mins   TUBAL LIGATION     Patient Active Problem List   Diagnosis Date Noted   Prediabetes 06/26/2022   Aortic atherosclerosis ([place]) 12/26/2021   Polyp of colon 12/26/2021   Familial hypercholesterolemia 10/28/2021   Osteoarthritis of right hip 01/02/2021   Status post right hip replacement 01/02/2021   Right sided weakness 10/28/2013   Cervical dystonia 10/28/2013   Chronic pain 10/28/2013   S/P total thyroidectomy 08/03/2013   Hypothyroidism 08/03/2013   Essential hypertension, benign 06/30/2013   Other forms of retinal detachment(361.89) 01/18/2013   Urinary incontinence, mixed 01/18/2013   Unspecified constipation 01/18/2013   HTN (hypertension) 12/28/2012   Family history of  colon cancer 05/30/2012   DJD (degenerative joint disease), cervical 05/30/2012   Multiple thyroid nodules 04/06/2012   Glaucoma 03/19/2012   Former smoker 03/19/2012   Chronic pain following surgery or procedure 03/19/2012   Breast calcifications on mammogram 03/19/2012   Anemia    Breast cancer (HCC)    Cataracts, bilateral    Depression    High cholesterol     ONSET DATE: 07/31/2022   REFERRING DIAG: M81.0 (ICD-10-CM) - Age-related osteoporosis without current pathological fracture   THERAPY DIAG:  Radiculopathy, cervical region  Abnormal posture  Other low back pain  Other abnormalities of gait and mobility  Rationale for Evaluation and Treatment Rehabilitation  SUBJECTIVE:                                                                                                                                                                                               SUBJECTIVE STATEMENT: Pt reports coming to PT due to diagnosis of osteoporosis and needing therapy to work on strengthening bones. Pt also reports having a neck surgery in 1998 where she had a rod placed. Pt reports she has had chronic pain in her neck and back as well as radicular pain down into her arms since this surgery. Pt takes gabapentin for her pain but always has some degree of neck pain as well as ongoing LE numbness. Pt reports that standing for an extended period of time or using her hands a lot increases her pain and makes her arms go numb. Pt reports she currently does line dancing 2-3 or even 4 times a week for an hour for exercise.  Pt accompanied by: self  PERTINENT HISTORY: HTN, aortic atherosclerosis, hypothyroidism, chronic pain, prediabetes  PAIN:  Are you having pain? Yes: NPRS scale: not rated/10 Pain location: neck, low back, arms, shoulders Pain description: dull, achy, burning Aggravating factors: standing for extended period of time Relieving factors: gabapentin  PRECAUTIONS: Other: Osteoporosis  WEIGHT BEARING RESTRICTIONS No  FALLS: Has patient fallen in last 6 months? No  LIVING ENVIRONMENT: Lives with: lives alone Lives in: House/apartment Stairs: No Has following equipment at home: Gilford Rile - 2 wheeled, shower chair, and Grab bars  PLOF: Independent with gait and Independent with transfers  PATIENT GOALS:  "to strengthen my bones" "to avoid going on medicine to strengthen my bones if possible"  OBJECTIVE:   DIAGNOSTIC FINDINGS: DEXA scan indicates osteoporosis per pt report, unable to access pt records  COGNITION: Overall cognitive status: Within functional limits for tasks assessed   SENSATION: Impaired in BUE and BLE since neck surgery in 1998   POSTURE: forward head  LOWER EXTREMITY ROM: Decreased R hip flex ROM   LOWER EXTREMITY MMT:    MMT Right Eval Left Eval  Hip flexion 5 4  Hip extension    Hip abduction    Hip  adduction    Hip internal rotation    Hip external rotation    Knee flexion 5 5  Knee extension 5 5  Ankle dorsiflexion 5 5  Ankle plantarflexion    Ankle inversion    Ankle eversion    (Blank rows = not tested)   CERVICAL ROM:   Active ROM A/PROM (deg) eval  Flexion 20   Extension 10  Right lateral flexion 20  Left lateral flexion 30  Right rotation 45  Left rotation 50   (Blank rows = not tested)  *however, pt reports having these limitations since her surgery in 1998   BED MOBILITY:  Independent per pt report but "It hurts my back" until meds kick in  TRANSFERS: Assistive device utilized: None  Sit to stand: Complete Independence Stand to sit: Complete Independence Chair to chair: Complete Independence Floor:  not assessed at eval   FUNCTIONAL TESTs:    [facility] PT Assessment - 08/23/22 0951       Ambulation/Gait   Gait velocity 32.8' over 13.28 ft = 2.47 ft/sec      Standardized Balance Assessment   Standardized Balance Assessment Timed Up and Go Test;Five Times Sit to Stand    Five times sit to stand comments  14.31   BUE support on armrests of chairs     Timed Up and Go Test   TUG Normal TUG    Normal TUG (seconds) 11.34   no AD             PATIENT SURVEYS:  NDI 22/50 ; 44% disabled  TODAY'S TREATMENT:  PT Evaluation   PATIENT EDUCATION: Education details: Eval findings, PT POC Person educated: Patient Education method: Explanation Education comprehension: verbalized understanding   HOME EXERCISE PROGRAM: To be established next session   GOALS: Goals reviewed with patient? Yes  SHORT TERM GOALS=LTG due to length of POC   LONG TERM GOALS: Target date: 10/04/2022  Pt will be independent with final HEP for improved strength, balance, transfers and gait.  Baseline:  Goal status: INITIAL  2.  Pt will improve score on NDI to 17/50 to demonstrate decreased disability level Baseline: 22/50 (9/29) Goal status: INITIAL  3.  Pt  will improve gait velocity to at least 3.5 ft/sec for improved gait efficiency and performance at Independent level  Baseline: 2.47 ft/sec (9/29) Goal status: INITIAL  4.  FGA or HiMat to be assessed and LTG written Baseline:  Goal status: INITIAL   ASSESSMENT:  CLINICAL IMPRESSION: Patient is a 76 year old female referred to Neuro OPPT for osteoporosis.   Pt's PMH is significant for: HTN, aortic atherosclerosis, hypothyroidism, chronic pain, prediabetes The following deficits were present during the exam: decreased gait speed, increased disability level, and increased pain. Based on her decreased gait speed of 2.47 ft/sec, pt is an increased risk for falls. Pt would benefit from skilled PT to address these impairments and functional limitations to maximize functional mobility independence.   OBJECTIVE IMPAIRMENTS decreased balance, decreased endurance, decreased knowledge of condition, impaired perceived functional ability, impaired sensation, impaired UE functional use, improper body mechanics, postural dysfunction, and pain.   ACTIVITY LIMITATIONS carrying, lifting, bending, squatting, stairs, transfers, and bed mobility  PARTICIPATION LIMITATIONS: meal prep, cleaning, laundry, and community activity  PERSONAL FACTORS Age,  Time since onset of injury/illness/exacerbation, and 1-2 comorbidities:    HTN, aortic atherosclerosis, hypothyroidism, chronic pain, prediabetesare also affecting patient's functional outcome.   REHAB POTENTIAL: Good  CLINICAL DECISION MAKING: Stable/uncomplicated  EVALUATION COMPLEXITY: Low  PLAN: PT FREQUENCY: 2x/week  PT DURATION: other: 13 visits (with PT evaluation)  PLANNED INTERVENTIONS: Therapeutic exercises, Therapeutic activity, Neuromuscular re-education, Balance training, Gait training, Patient/Family education, Self Care, Joint mobilization, Joint manipulation, Stair training, Vestibular training, Canalith repositioning, Visual/preceptual  remediation/compensation, Orthotic/Fit training, DME instructions, Aquatic Therapy, Dry Needling, Electrical stimulation, Spinal manipulation, Spinal mobilization, Cryotherapy, Moist heat, Taping, Manual therapy, and Re-evaluation  PLAN FOR NEXT SESSION: FGA or HiMat, initiate HEP for strengthening/WB for osteoporosis, work on balance with initial sit to stands   Excell Seltzer, PT, DPT, CSRS 08/23/2022, 11:14 AM

## 2022-08-26 ENCOUNTER — Ambulatory Visit: Payer: Medicare Other | Attending: Internal Medicine | Admitting: Physical Therapy

## 2022-08-26 ENCOUNTER — Encounter: Payer: Self-pay | Admitting: Family Medicine

## 2022-08-26 DIAGNOSIS — M5412 Radiculopathy, cervical region: Secondary | ICD-10-CM | POA: Diagnosis present

## 2022-08-26 DIAGNOSIS — R2689 Other abnormalities of gait and mobility: Secondary | ICD-10-CM | POA: Insufficient documentation

## 2022-08-26 DIAGNOSIS — R293 Abnormal posture: Secondary | ICD-10-CM | POA: Diagnosis present

## 2022-08-26 DIAGNOSIS — M5459 Other low back pain: Secondary | ICD-10-CM | POA: Diagnosis present

## 2022-08-26 NOTE — Therapy (Signed)
OUTPATIENT PHYSICAL THERAPY NEURO TREATMENT   Patient Name: [name] [name] MRN: [id number] DOB:1946-03-24, 76 y.o., female Today's Date: 08/26/2022   PCP: Tawnya Crook, MD REFERRING PROVIDER: Delrae Rend, MD    PT End of Session - 08/26/22 1533     Visit Number 2    Number of Visits 13   with eval   Date for PT Re-Evaluation 10/18/22   to allow for scheduling conflicts   Authorization Type [employer] Medicare    Progress Note Due on Visit 10    PT Start Time 1530    PT Stop Time 1615    PT Time Calculation (min) 45 min    Equipment Utilized During Treatment Gait belt    Activity Tolerance Patient tolerated treatment well    Behavior During Therapy WFL for tasks assessed/performed              Past Medical History:  Diagnosis Date   Abnormal Pap smear    Abnormal peripheral vision    RT EYE   Allergy    Anemia    Arthritis    back and neck   Breast cancer ([place]) 1997   right breast   Bruises easily    Cataracts, bilateral    Chronic back pain    Chronic neck pain    Complication of anesthesia    slow to wake up   Family history of adverse reaction to anesthesia    Dad takes a long time to wake up   Frequency of urination    GERD (gastroesophageal reflux disease)    Glaucoma    Goiter    High cholesterol    History of postoperative nausea and vomiting    Hypertension    Hypothyroidism    Neuromuscular disorder (HCC)    Numbness    hands / feet /arms / legs - followed by Dr. Georgann Housekeeper - [place]   PONV (postoperative nausea and vomiting)    Post-menopausal    Sexual dysfunction    Shortness of breath    with exertion and Wt gain   Urinary incontinence    Past Surgical History:  Procedure Laterality Date   BIOPSY THYROID     BREAST SURGERY Right    lymph node removed   CATARACT EXTRACTION, BILATERAL  2009,2010   COLONOSCOPY  02/15/2014   Dr.Stark   EYE SURGERY  08/2012   detached retina surg   LAPAROSCOPY  1978   BTL   lumpectomy  1997    Rt   neck fusion  2010   THYROIDECTOMY N/A 05/04/2013   Procedure:  TOTAL THYROIDECTOMY;  Surgeon: Earnstine Regal, MD;  Location: [facility];  Service: General;  Laterality: N/A;   TOTAL HIP ARTHROPLASTY Right 01/02/2021   Procedure: TOTAL HIP ARTHROPLASTY ANTERIOR APPROACH;  Surgeon: Paralee Cancel, MD;  Location: [facility];  Service: Orthopedics;  Laterality: Right;  70 mins   TUBAL LIGATION     Patient Active Problem List   Diagnosis Date Noted   Prediabetes 06/26/2022   Aortic atherosclerosis ([place]) 12/26/2021   Polyp of colon 12/26/2021   Familial hypercholesterolemia 10/28/2021   Osteoarthritis of right hip 01/02/2021   Status post right hip replacement 01/02/2021   Right sided weakness 10/28/2013   Cervical dystonia 10/28/2013   Chronic pain 10/28/2013   S/P total thyroidectomy 08/03/2013   Hypothyroidism 08/03/2013   Essential hypertension, benign 06/30/2013   Other forms of retinal detachment(361.89) 01/18/2013   Urinary incontinence, mixed 01/18/2013   Unspecified constipation 01/18/2013  HTN (hypertension) 12/28/2012   Family history of colon cancer 05/30/2012   DJD (degenerative joint disease), cervical 05/30/2012   Multiple thyroid nodules 04/06/2012   Glaucoma 03/19/2012   Former smoker 03/19/2012   Chronic pain following surgery or procedure 03/19/2012   Breast calcifications on mammogram 03/19/2012   Anemia    Breast cancer ([place])    Cataracts, bilateral    Depression    High cholesterol     ONSET DATE: 07/31/2022   REFERRING DIAG: M81.0 (ICD-10-CM) - Age-related osteoporosis without current pathological fracture   THERAPY DIAG:  Radiculopathy, cervical region  Abnormal posture  Other low back pain  Other abnormalities of gait and mobility  Rationale for Evaluation and Treatment Rehabilitation  SUBJECTIVE:                                                                                                                                                                                               SUBJECTIVE STATEMENT: Pt reports being busy today working on getting a new washer/dryer and flooring in her home. Pt reports her pain is better today than it was yesterday.  Pt accompanied by: self  PERTINENT HISTORY: HTN, aortic atherosclerosis, hypothyroidism, chronic pain, prediabetes  PAIN:  Are you having pain? Yes: NPRS scale: not rated/10 Pain location: neck, low back, arms, shoulders Pain description: dull, achy, burning Aggravating factors: standing for extended period of time Relieving factors: gabapentin  PRECAUTIONS: Other: Osteoporosis  WEIGHT BEARING RESTRICTIONS No  FALLS: Has patient fallen in last 6 months? No  LIVING ENVIRONMENT: Lives with: lives alone Lives in: House/apartment Stairs: No Has following equipment at home: Gilford Rile - 2 wheeled, shower chair, and Grab bars  PLOF: Independent with gait and Independent with transfers  PATIENT GOALS:  "to strengthen my bones" "to avoid going on medicine to strengthen my bones if possible"  OBJECTIVE:   TODAY'S TREATMENT:  THER ACT:  [facility] PT Assessment - 08/26/22 1537       Functional Gait  Assessment   Gait assessed  Yes    Gait Level Surface Walks 20 ft, slow speed, abnormal gait pattern, evidence for imbalance or deviates 10-15 in outside of the 12 in walkway width. Requires more than 7 sec to ambulate 20 ft.    Change in Gait Speed Able to change speed, demonstrates mild gait deviations, deviates 6-10 in outside of the 12 in walkway width, or no gait deviations, unable to achieve a major change in velocity, or uses a change in velocity, or uses an assistive device.    Gait with Horizontal Head Turns Performs head turns with moderate changes in gait  velocity, slows down, deviates 10-15 in outside 12 in walkway width but recovers, can continue to walk.    Gait with Vertical Head Turns Performs task with moderate change in gait velocity, slows down, deviates 10-15 in outside  12 in walkway width but recovers, can continue to walk.    Gait and Pivot Turn Pivot turns safely in greater than 3 sec and stops with no loss of balance, or pivot turns safely within 3 sec and stops with mild imbalance, requires small steps to catch balance.    Step Over Obstacle Is able to step over one shoe box (4.5 in total height) without changing gait speed. No evidence of imbalance.    Gait with Narrow Base of Support Ambulates less than 4 steps heel to toe or cannot perform without assistance.    Gait with Eyes Closed Walks 20 ft, slow speed, abnormal gait pattern, evidence for imbalance, deviates 10-15 in outside 12 in walkway width. Requires more than 9 sec to ambulate 20 ft.    Ambulating Backwards Walks 20 ft, slow speed, abnormal gait pattern, evidence for imbalance, deviates 10-15 in outside 12 in walkway width.    Steps Alternating feet, must use rail.    Total Score 13    FGA comment: 13/30   high fall risk            THER EX: Sit to stands x 10 reps with red theraband, focus on eccentric control when sitting and placed pillows behind pt for target Resisted side-steps at countertop with red theraband  Added to HEP, see bolded below   PATIENT EDUCATION: Education details: initiated HEP, OM results and functional implications Person educated: Patient Education method: Explanation and Handouts Education comprehension: verbalized understanding   HOME EXERCISE PROGRAM: Access Code: [email or web address] URL: [email or web address] Date: 08/26/2022 Prepared by: Excell Seltzer  Exercises - Squat with Chair Touch and Resistance Loop  - 1 x daily - 7 x weekly - 3 sets - 10 reps - Side Stepping with Resistance at Ankles and Counter Support  - 1 x daily - 7 x weekly - 3 sets - 10 reps   GOALS: Goals reviewed with patient? Yes  SHORT TERM GOALS=LTG due to length of POC   LONG TERM GOALS: Target date: 10/04/2022  Pt will be independent with final HEP for improved  strength, balance, transfers and gait. Baseline:  Goal status: INITIAL  2.  Pt will improve score on NDI to 17/50 to demonstrate decreased disability level Baseline: 22/50 (9/29) Goal status: INITIAL  3.  Pt will improve gait velocity to at least 3.5 ft/sec for improved gait efficiency and performance at Independent level  Baseline: 2.47 ft/sec (9/29) Goal status: INITIAL  4.  Pt will improve FGA to 19/30 for decreased fall risk  Baseline: 13/30 (10/2) Goal status: INITIAL   ASSESSMENT:  CLINICAL IMPRESSION: Emphasis of skilled PT session on assessing FGA and creating LTG for score improvement and initiating HEP with patient. Pt scores 13/30 on the FGA, indicating a high fall risk. Pt exhibits most difficulty with gait with horizontal and vertical head turns, backwards gait, and tandem gait. Pt will benefit from ongoing education regarding osteoporosis diagnosis and appropriate exercises to engage in to safely work on strengthening while avoiding a fracture. Continue POC.    OBJECTIVE IMPAIRMENTS decreased balance, decreased endurance, decreased knowledge of condition, impaired perceived functional ability, impaired sensation, impaired UE functional use, improper body mechanics, postural dysfunction, and pain.   ACTIVITY LIMITATIONS carrying, lifting, bending, squatting, stairs, transfers,  and bed mobility  PARTICIPATION LIMITATIONS: meal prep, cleaning, laundry, and community activity  PERSONAL FACTORS Age, Time since onset of injury/illness/exacerbation, and 1-2 comorbidities:    HTN, aortic atherosclerosis, hypothyroidism, chronic pain, prediabetesare also affecting patient's functional outcome.   REHAB POTENTIAL: Good  CLINICAL DECISION MAKING: Stable/uncomplicated  EVALUATION COMPLEXITY: Low  PLAN: PT FREQUENCY: 2x/week  PT DURATION: other: 13 visits (with PT evaluation)  PLANNED INTERVENTIONS: Therapeutic exercises, Therapeutic activity, Neuromuscular re-education,  Balance training, Gait training, Patient/Family education, Self Care, Joint mobilization, Joint manipulation, Stair training, Vestibular training, Canalith repositioning, Visual/preceptual remediation/compensation, Orthotic/Fit training, DME instructions, Aquatic Therapy, Dry Needling, Electrical stimulation, Spinal manipulation, Spinal mobilization, Cryotherapy, Moist heat, Taping, Manual therapy, and Re-evaluation  PLAN FOR NEXT SESSION: add to HEP for strengthening/WB for osteoporosis, core strengthening (no crunches, sit-ups, twisting), lunges, scap squeezes, modified push-ups, monster walks, higher level balance   Excell Seltzer, PT, DPT, CSRS 08/26/2022, 4:18 PM

## 2022-08-30 ENCOUNTER — Ambulatory Visit: Payer: Medicare Other | Admitting: Physical Therapy

## 2022-08-30 ENCOUNTER — Encounter: Payer: Self-pay | Admitting: Physical Therapy

## 2022-08-30 DIAGNOSIS — R293 Abnormal posture: Secondary | ICD-10-CM

## 2022-08-30 DIAGNOSIS — M5412 Radiculopathy, cervical region: Secondary | ICD-10-CM

## 2022-08-30 DIAGNOSIS — R2689 Other abnormalities of gait and mobility: Secondary | ICD-10-CM

## 2022-08-30 DIAGNOSIS — M5459 Other low back pain: Secondary | ICD-10-CM

## 2022-08-30 NOTE — Patient Instructions (Signed)
Access Code: [email or web address] URL: [email or web address] Date: 08/30/2022 Prepared by: [place] with Chair Touch and Resistance Loop  - 1 x daily - 7 x weekly - 3 sets - 10 reps - Side Stepping with Resistance at Ankles and Counter Support  - 1 x daily - 7 x weekly - 3 sets - 10 reps - Scapular retraction with resistance  - 1 x daily - 7 x weekly - 3 sets - 10 reps - Bird Dog  - 1 x daily - 7 x weekly - 3 sets - 10 reps - n/a hold

## 2022-08-30 NOTE — Therapy (Signed)
OUTPATIENT PHYSICAL THERAPY NEURO TREATMENT   Patient Name: [name] [name] MRN: [id number] DOB:[birth year], 76 y.o., female Today's Date: 08/30/2022   PCP: Tawnya Crook, MD REFERRING PROVIDER: Delrae Rend, MD    PT End of Session - 08/30/22 1403     Visit Number 3    Number of Visits 13   with eval   Date for PT Re-Evaluation 10/18/22   to allow for scheduling conflicts   Authorization Type [employer]    Progress Note Due on Visit 10    PT Start Time 1403   Pt in restroom at onset of session.   PT Stop Time 1444    PT Time Calculation (min) 41 min    Equipment Utilized During Treatment Gait belt    Activity Tolerance Patient tolerated treatment well    Behavior During Therapy WFL for tasks assessed/performed              Past Medical History:  Diagnosis Date   Abnormal Pap smear    Abnormal peripheral vision    RT EYE   Allergy    Anemia    Arthritis    back and neck   Breast cancer ([place]) 1997   right breast   Bruises easily    Cataracts, bilateral    Chronic back pain    Chronic neck pain    Complication of anesthesia    slow to wake up   Family history of adverse reaction to anesthesia    Dad takes a long time to wake up   Frequency of urination    GERD (gastroesophageal reflux disease)    Glaucoma    Goiter    High cholesterol    History of postoperative nausea and vomiting    Hypertension    Hypothyroidism    Neuromuscular disorder (HCC)    Numbness    hands / feet /arms / legs - followed by Dr. Georgann Housekeeper - [place]   PONV (postoperative nausea and vomiting)    Post-menopausal    Sexual dysfunction    Shortness of breath    with exertion and Wt gain   Urinary incontinence    Past Surgical History:  Procedure Laterality Date   BIOPSY THYROID     BREAST SURGERY Right    lymph node removed   CATARACT EXTRACTION, BILATERAL  2009,2010   COLONOSCOPY  02/15/2014   Dr.Stark   EYE SURGERY  08/2012   detached retina surg   LAPAROSCOPY   1978   BTL   lumpectomy  1997   Rt   neck fusion  2010   THYROIDECTOMY N/A 05/04/2013   Procedure:  TOTAL THYROIDECTOMY;  Surgeon: Earnstine Regal, MD;  Location: [facility];  Service: General;  Laterality: N/A;   TOTAL HIP ARTHROPLASTY Right 01/02/2021   Procedure: TOTAL HIP ARTHROPLASTY ANTERIOR APPROACH;  Surgeon: Paralee Cancel, MD;  Location: [facility];  Service: Orthopedics;  Laterality: Right;  70 mins   TUBAL LIGATION     Patient Active Problem List   Diagnosis Date Noted   Prediabetes 06/26/2022   Aortic atherosclerosis ([place]) 12/26/2021   Polyp of colon 12/26/2021   Familial hypercholesterolemia 10/28/2021   Osteoarthritis of right hip 01/02/2021   Status post right hip replacement 01/02/2021   Right sided weakness 10/28/2013   Cervical dystonia 10/28/2013   Chronic pain 10/28/2013   S/P total thyroidectomy 08/03/2013   Hypothyroidism 08/03/2013   Essential hypertension, benign 06/30/2013   Other forms of retinal detachment(361.89) 01/18/2013   Urinary  incontinence, mixed 01/18/2013   Unspecified constipation 01/18/2013   HTN (hypertension) 12/28/2012   Family history of colon cancer 05/30/2012   DJD (degenerative joint disease), cervical 05/30/2012   Multiple thyroid nodules 04/06/2012   Glaucoma 03/19/2012   Former smoker 03/19/2012   Chronic pain following surgery or procedure 03/19/2012   Breast calcifications on mammogram 03/19/2012   Anemia    Breast cancer ([place])    Cataracts, bilateral    Depression    High cholesterol     ONSET DATE: 07/31/2022   REFERRING DIAG: M81.0 (ICD-10-CM) - Age-related osteoporosis without current pathological fracture   THERAPY DIAG:  Radiculopathy, cervical region  Abnormal posture  Other low back pain  Other abnormalities of gait and mobility  Rationale for Evaluation and Treatment Rehabilitation  SUBJECTIVE:                                                                                                                                                                                               SUBJECTIVE STATEMENT: Pt reports she has a hard time getting her exercises in due to being so busy.  She denies falls or other acute issues since last visit.  Pt accompanied by: self  PERTINENT HISTORY: HTN, aortic atherosclerosis, hypothyroidism, chronic pain, prediabetes  PAIN:  Are you having pain? Yes: NPRS scale: 3/10 Pain location: low back mostly Pain description: achy Aggravating factors: standing for extended period of time; mornings are worse-hard to get out of bed Relieving factors: gabapentin  PRECAUTIONS: Other: Osteoporosis  WEIGHT BEARING RESTRICTIONS No  FALLS: Has patient fallen in last 6 months? No  LIVING ENVIRONMENT: Lives with: lives alone Lives in: House/apartment Stairs: No Has following equipment at home: Gilford Rile - 2 wheeled, shower chair, and Grab bars  PLOF: Independent with gait and Independent with transfers  PATIENT GOALS:  "to strengthen my bones" "to avoid going on medicine to strengthen my bones if possible"  OBJECTIVE:   TODAY'S TREATMENT:  Attempted to initiate a walking program for pt, pt reluctant due to hills around home.  Discussed safe use of gizelle machine x5 minutes instead to promote aerobic tolerance.  Pt states she can get on and off of machine without issues.  Seated on airex vertical shoulder flexion to 90 deg w/ 4# wt bar x10 > seated on soft BOSU w/ toes on edge of 4" step shoulder flexion w/ 4# wt bar to 90 deg 2x10  Fwd shoulder press w/ 4# wt bar seated on soft BOSU x10 > scapular retraction 2x15 w/ red theraband (added to HEP)  Tandem fwd/backwards 2x8' at countertop w/ LUE support and LOB  to right x1 on initial step of backwards tandem walking   Ambulating x100' w/ head turns, no LOB  Ambulating w/ ball toss x55', no LOB, cued to track with eyes  Ambulating w/ ball toss and drop, no LOB, pt demonstrates good inc step and step speed to keep up with  moving target tracking with eyes.  Squats w/ progressive depth w/ 5# kettlebell x25, pt remains at shallow depth w/ cuing to translate weight anteriorly to improve form  PATIENT EDUCATION: Education details: Continue HEP.   Person educated: Patient Education method: Explanation and Handouts Education comprehension: verbalized understanding   HOME EXERCISE PROGRAM: Access Code: [email or web address] URL: [email or web address] Date: 08/30/2022 Prepared by: [place] with Chair Touch and Resistance Loop  - 1 x daily - 7 x weekly - 3 sets - 10 reps - Side Stepping with Resistance at Ankles and Counter Support  - 1 x daily - 7 x weekly - 3 sets - 10 reps - Scapular retraction with resistance  - 1 x daily - 7 x weekly - 3 sets - 10 reps - Bird Dog  - 1 x daily - 7 x weekly - 3 sets - 10 reps - n/a hold  GOALS: Goals reviewed with patient? Yes  SHORT TERM GOALS=LTG due to length of POC   LONG TERM GOALS: Target date: 10/04/2022  Pt will be independent with final HEP for improved strength, balance, transfers and gait. Baseline:  Goal status: INITIAL  2.  Pt will improve score on NDI to 17/50 to demonstrate decreased disability level Baseline: 22/50 (9/29) Goal status: INITIAL  3.  Pt will improve gait velocity to at least 3.5 ft/sec for improved gait efficiency and performance at Independent level  Baseline: 2.47 ft/sec (9/29) Goal status: INITIAL  4.  Pt will improve FGA to 19/30 for decreased fall risk  Baseline: 13/30 (10/2) Goal status: INITIAL   ASSESSMENT:  CLINICAL IMPRESSION: Pt hesitant to perform any overhead activity or thoracic extension movements due to cervical fusion.  Continued to address body-weight and weighted core and LE strengthening with additions made to HEP.  Pt remains limited by time restraints preventing full engagement to HEP at home and quick pacing of motions limiting eccentric benefit.  She continues to benefit from  skilled PT to further address fall risk with dynamic balance tasks and generalized strength and conditioning for bone health.    OBJECTIVE IMPAIRMENTS decreased balance, decreased endurance, decreased knowledge of condition, impaired perceived functional ability, impaired sensation, impaired UE functional use, improper body mechanics, postural dysfunction, and pain.   ACTIVITY LIMITATIONS carrying, lifting, bending, squatting, stairs, transfers, and bed mobility  PARTICIPATION LIMITATIONS: meal prep, cleaning, laundry, and community activity  PERSONAL FACTORS Age, Time since onset of injury/illness/exacerbation, and 1-2 comorbidities:    HTN, aortic atherosclerosis, hypothyroidism, chronic pain, prediabetesare also affecting patient's functional outcome.   REHAB POTENTIAL: Good  CLINICAL DECISION MAKING: Stable/uncomplicated  EVALUATION COMPLEXITY: Low  PLAN: PT FREQUENCY: 2x/week  PT DURATION: other: 13 visits (with PT evaluation)  PLANNED INTERVENTIONS: Therapeutic exercises, Therapeutic activity, Neuromuscular re-education, Balance training, Gait training, Patient/Family education, Self Care, Joint mobilization, Joint manipulation, Stair training, Vestibular training, Canalith repositioning, Visual/preceptual remediation/compensation, Orthotic/Fit training, DME instructions, Aquatic Therapy, Dry Needling, Electrical stimulation, Spinal manipulation, Spinal mobilization, Cryotherapy, Moist heat, Taping, Manual therapy, and Re-evaluation  PLAN FOR NEXT SESSION: add to HEP for strengthening/WB for osteoporosis, core strengthening (no crunches, sit-ups, twisting), lunges, scap squeezes, modified push-ups/plank, monster walks, higher level balance  Bary Richard, PT, DPT 08/30/2022, 4:47 PM

## 2022-09-02 ENCOUNTER — Ambulatory Visit: Payer: Medicare Other | Admitting: Physical Therapy

## 2022-09-02 DIAGNOSIS — R2689 Other abnormalities of gait and mobility: Secondary | ICD-10-CM

## 2022-09-02 DIAGNOSIS — M5412 Radiculopathy, cervical region: Secondary | ICD-10-CM

## 2022-09-02 DIAGNOSIS — M5459 Other low back pain: Secondary | ICD-10-CM

## 2022-09-02 DIAGNOSIS — R293 Abnormal posture: Secondary | ICD-10-CM

## 2022-09-02 NOTE — Therapy (Signed)
OUTPATIENT PHYSICAL THERAPY NEURO TREATMENT   Patient Name: [name] [name] MRN: [id number] DOB:06/16/1946, 76 y.o., female Today's Date: 09/02/2022   PCP: Tawnya Crook, MD REFERRING PROVIDER: Delrae Rend, MD    PT End of Session - 09/02/22 1533     Visit Number 4    Number of Visits 13   with eval   Date for PT Re-Evaluation 10/18/22   to allow for scheduling conflicts   Authorization Type [employer] Medicare    Progress Note Due on Visit 10    PT Start Time 1532    PT Stop Time 1611    PT Time Calculation (min) 39 min    Equipment Utilized During Treatment Gait belt    Activity Tolerance Patient tolerated treatment well    Behavior During Therapy WFL for tasks assessed/performed               Past Medical History:  Diagnosis Date   Abnormal Pap smear    Abnormal peripheral vision    RT EYE   Allergy    Anemia    Arthritis    back and neck   Breast cancer ([place]) 1997   right breast   Bruises easily    Cataracts, bilateral    Chronic back pain    Chronic neck pain    Complication of anesthesia    slow to wake up   Family history of adverse reaction to anesthesia    Dad takes a long time to wake up   Frequency of urination    GERD (gastroesophageal reflux disease)    Glaucoma    Goiter    High cholesterol    History of postoperative nausea and vomiting    Hypertension    Hypothyroidism    Neuromuscular disorder (HCC)    Numbness    hands / feet /arms / legs - followed by Dr. Georgann Housekeeper - [place]   PONV (postoperative nausea and vomiting)    Post-menopausal    Sexual dysfunction    Shortness of breath    with exertion and Wt gain   Urinary incontinence    Past Surgical History:  Procedure Laterality Date   BIOPSY THYROID     BREAST SURGERY Right    lymph node removed   CATARACT EXTRACTION, BILATERAL  2009,2010   COLONOSCOPY  02/15/2014   Dr.Stark   EYE SURGERY  08/2012   detached retina surg   LAPAROSCOPY  1978   BTL   lumpectomy  1997    Rt   neck fusion  2010   THYROIDECTOMY N/A 05/04/2013   Procedure:  TOTAL THYROIDECTOMY;  Surgeon: Earnstine Regal, MD;  Location: [facility];  Service: General;  Laterality: N/A;   TOTAL HIP ARTHROPLASTY Right 01/02/2021   Procedure: TOTAL HIP ARTHROPLASTY ANTERIOR APPROACH;  Surgeon: Paralee Cancel, MD;  Location: [facility];  Service: Orthopedics;  Laterality: Right;  70 mins   TUBAL LIGATION     Patient Active Problem List   Diagnosis Date Noted   Prediabetes 06/26/2022   Aortic atherosclerosis ([place]) 12/26/2021   Polyp of colon 12/26/2021   Familial hypercholesterolemia 10/28/2021   Osteoarthritis of right hip 01/02/2021   Status post right hip replacement 01/02/2021   Right sided weakness 10/28/2013   Cervical dystonia 10/28/2013   Chronic pain 10/28/2013   S/P total thyroidectomy 08/03/2013   Hypothyroidism 08/03/2013   Essential hypertension, benign 06/30/2013   Other forms of retinal detachment(361.89) 01/18/2013   Urinary incontinence, mixed 01/18/2013   Unspecified constipation  01/18/2013   HTN (hypertension) 12/28/2012   Family history of colon cancer 05/30/2012   DJD (degenerative joint disease), cervical 05/30/2012   Multiple thyroid nodules 04/06/2012   Glaucoma 03/19/2012   Former smoker 03/19/2012   Chronic pain following surgery or procedure 03/19/2012   Breast calcifications on mammogram 03/19/2012   Anemia    Breast cancer ([place])    Cataracts, bilateral    Depression    High cholesterol     ONSET DATE: 07/31/2022   REFERRING DIAG: M81.0 (ICD-10-CM) - Age-related osteoporosis without current pathological fracture   THERAPY DIAG:  Radiculopathy, cervical region  Abnormal posture  Other low back pain  Other abnormalities of gait and mobility  Rationale for Evaluation and Treatment Rehabilitation  SUBJECTIVE:                                                                                                                                                                                               SUBJECTIVE STATEMENT: Pt reports some soreness in her shoulders today from painting the walls at home. Pt reports she has been walking at home, has not been able to do her HEP due to being so busy with home renovations and her line-dancing classes.  Pt accompanied by: self  PERTINENT HISTORY: HTN, aortic atherosclerosis, hypothyroidism, chronic pain, prediabetes  PAIN:  Are you having pain? Yes: NPRS scale: 3/10 Pain location: low back mostly Pain description: achy Aggravating factors: standing for extended period of time; mornings are worse-hard to get out of bed Relieving factors: gabapentin  PRECAUTIONS: Other: Osteoporosis  WEIGHT BEARING RESTRICTIONS No  FALLS: Has patient fallen in last 6 months? No  LIVING ENVIRONMENT: Lives with: lives alone Lives in: House/apartment Stairs: No Has following equipment at home: Gilford Rile - 2 wheeled, shower chair, and Grab bars  PLOF: Independent with gait and Independent with transfers  PATIENT GOALS:  "to strengthen my bones" "to avoid going on medicine to strengthen my bones if possible"  OBJECTIVE:   TODAY'S TREATMENT:  THER EX: Squat x 10 reps, progression to squats with 5.5# weighted ball 2 x 10 reps with cues for correct body positioning  Lunges with gait x 10 reps with min A for balance Static lunges x 10 reps bilaterally with countertop support  Monster walks with green theraband at counter top, 4 x 10 ft each direction. Pt reports some flare-up of her sciatica with this exercise. Further repetitions deferred due to onset of sciatic pain.  Gait 2 x 115 ft holding 5.5# weight performing alternating punch-outs and OH lifts  Leg press 2 x 12 reps at 60#  PATIENT EDUCATION:  Education details: Continue HEP, will adjust next session  Person educated: Patient Education method: Explanation and Handouts Education comprehension: verbalized understanding   HOME EXERCISE PROGRAM: Access  Code: [email or web address] URL: [email or web address] Date: 08/30/2022 Prepared by: [place] with Chair Touch and Resistance Loop  - 1 x daily - 7 x weekly - 3 sets - 10 reps - Side Stepping with Resistance at Ankles and Counter Support  - 1 x daily - 7 x weekly - 3 sets - 10 reps - Scapular retraction with resistance  - 1 x daily - 7 x weekly - 3 sets - 10 reps - Bird Dog  - 1 x daily - 7 x weekly - 3 sets - 10 reps - n/a hold  GOALS: Goals reviewed with patient? Yes  SHORT TERM GOALS=LTG due to length of POC   LONG TERM GOALS: Target date: 10/04/2022  Pt will be independent with final HEP for improved strength, balance, transfers and gait. Baseline:  Goal status: INITIAL  2.  Pt will improve score on NDI to 17/50 to demonstrate decreased disability level Baseline: 22/50 (9/29) Goal status: INITIAL  3.  Pt will improve gait velocity to at least 3.5 ft/sec for improved gait efficiency and performance at Independent level  Baseline: 2.47 ft/sec (9/29) Goal status: INITIAL  4.  Pt will improve FGA to 19/30 for decreased fall risk  Baseline: 13/30 (10/2) Goal status: INITIAL   ASSESSMENT:  CLINICAL IMPRESSION: Emphasis of skilled PT session on working on UB and LB strengthening for improving bone health and strength. Trialed several different exercises to target LE strength to determine which exercise pt may benefit from the most and will be able to perform safely and independently at home. Pt states that 4 exercises is about the limit for exercises she is able to do for her HEP due to time constraints. Pt seems to be safest with leg press machine for LE strengthening this session due to difficulty performing squats correctly and without LOB. Pt continues to benefit from skilled therapy services to address fall risk with dynamic balance task and generalized strengthening to improve bone health. Continue POC.   OBJECTIVE IMPAIRMENTS decreased  balance, decreased endurance, decreased knowledge of condition, impaired perceived functional ability, impaired sensation, impaired UE functional use, improper body mechanics, postural dysfunction, and pain.   ACTIVITY LIMITATIONS carrying, lifting, bending, squatting, stairs, transfers, and bed mobility  PARTICIPATION LIMITATIONS: meal prep, cleaning, laundry, and community activity  PERSONAL FACTORS Age, Time since onset of injury/illness/exacerbation, and 1-2 comorbidities:    HTN, aortic atherosclerosis, hypothyroidism, chronic pain, prediabetesare also affecting patient's functional outcome.   REHAB POTENTIAL: Good  CLINICAL DECISION MAKING: Stable/uncomplicated  EVALUATION COMPLEXITY: Low  PLAN: PT FREQUENCY: 2x/week  PT DURATION: other: 13 visits (with PT evaluation)  PLANNED INTERVENTIONS: Therapeutic exercises, Therapeutic activity, Neuromuscular re-education, Balance training, Gait training, Patient/Family education, Self Care, Joint mobilization, Joint manipulation, Stair training, Vestibular training, Canalith repositioning, Visual/preceptual remediation/compensation, Orthotic/Fit training, DME instructions, Aquatic Therapy, Dry Needling, Electrical stimulation, Spinal manipulation, Spinal mobilization, Cryotherapy, Moist heat, Taping, Manual therapy, and Re-evaluation  PLAN FOR NEXT SESSION: add to HEP/adjust HEP for strengthening/WB for osteoporosis (leg press vs squats), core strengthening (no crunches, sit-ups, twisting), lunges, scap squeezes, modified push-ups/plank, monster walks, higher level balance, lunges with UE support, surge    Excell Seltzer, PT, DPT, CSRS 09/02/2022, 4:11 PM

## 2022-09-04 ENCOUNTER — Ambulatory Visit: Payer: Medicare Other | Admitting: Physical Therapy

## 2022-09-04 DIAGNOSIS — R2689 Other abnormalities of gait and mobility: Secondary | ICD-10-CM

## 2022-09-04 DIAGNOSIS — R293 Abnormal posture: Secondary | ICD-10-CM

## 2022-09-04 DIAGNOSIS — M5412 Radiculopathy, cervical region: Secondary | ICD-10-CM

## 2022-09-04 DIAGNOSIS — M5459 Other low back pain: Secondary | ICD-10-CM

## 2022-09-04 NOTE — Therapy (Signed)
OUTPATIENT PHYSICAL THERAPY NEURO TREATMENT   Patient Name: [name] [name] MRN: [id number] DOB:[birth year], 76 y.o., female Today's Date: 09/04/2022   PCP: Tawnya Crook, MD REFERRING PROVIDER: Delrae Rend, MD    PT End of Session - 09/04/22 1314     Visit Number 5    Number of Visits 13   with eval   Date for PT Re-Evaluation 10/18/22   to allow for scheduling conflicts   Authorization Type [employer] Medicare    Progress Note Due on Visit 10    PT Start Time 1312    PT Stop Time 1356    PT Time Calculation (min) 44 min    Equipment Utilized During Treatment Gait belt    Activity Tolerance Patient tolerated treatment well    Behavior During Therapy WFL for tasks assessed/performed                Past Medical History:  Diagnosis Date   Abnormal Pap smear    Abnormal peripheral vision    RT EYE   Allergy    Anemia    Arthritis    back and neck   Breast cancer ([place]) 1997   right breast   Bruises easily    Cataracts, bilateral    Chronic back pain    Chronic neck pain    Complication of anesthesia    slow to wake up   Family history of adverse reaction to anesthesia    Dad takes a long time to wake up   Frequency of urination    GERD (gastroesophageal reflux disease)    Glaucoma    Goiter    High cholesterol    History of postoperative nausea and vomiting    Hypertension    Hypothyroidism    Neuromuscular disorder (HCC)    Numbness    hands / feet /arms / legs - followed by Dr. Georgann Housekeeper - [place]   PONV (postoperative nausea and vomiting)    Post-menopausal    Sexual dysfunction    Shortness of breath    with exertion and Wt gain   Urinary incontinence    Past Surgical History:  Procedure Laterality Date   BIOPSY THYROID     BREAST SURGERY Right    lymph node removed   CATARACT EXTRACTION, BILATERAL  2009,2010   COLONOSCOPY  02/15/2014   Dr.Stark   EYE SURGERY  08/2012   detached retina surg   LAPAROSCOPY  1978   BTL   lumpectomy   1997   Rt   neck fusion  2010   THYROIDECTOMY N/A 05/04/2013   Procedure:  TOTAL THYROIDECTOMY;  Surgeon: Earnstine Regal, MD;  Location: [facility];  Service: General;  Laterality: N/A;   TOTAL HIP ARTHROPLASTY Right 01/02/2021   Procedure: TOTAL HIP ARTHROPLASTY ANTERIOR APPROACH;  Surgeon: Paralee Cancel, MD;  Location: [facility];  Service: Orthopedics;  Laterality: Right;  70 mins   TUBAL LIGATION     Patient Active Problem List   Diagnosis Date Noted   Prediabetes 06/26/2022   Aortic atherosclerosis ([place]) 12/26/2021   Polyp of colon 12/26/2021   Familial hypercholesterolemia 10/28/2021   Osteoarthritis of right hip 01/02/2021   Status post right hip replacement 01/02/2021   Right sided weakness 10/28/2013   Cervical dystonia 10/28/2013   Chronic pain 10/28/2013   S/P total thyroidectomy 08/03/2013   Hypothyroidism 08/03/2013   Essential hypertension, benign 06/30/2013   Other forms of retinal detachment(361.89) 01/18/2013   Urinary incontinence, mixed 01/18/2013   Unspecified  constipation 01/18/2013   HTN (hypertension) 12/28/2012   Family history of colon cancer 05/30/2012   DJD (degenerative joint disease), cervical 05/30/2012   Multiple thyroid nodules 04/06/2012   Glaucoma 03/19/2012   Former smoker 03/19/2012   Chronic pain following surgery or procedure 03/19/2012   Breast calcifications on mammogram 03/19/2012   Anemia    Breast cancer ([place])    Cataracts, bilateral    Depression    High cholesterol     ONSET DATE: 07/31/2022   REFERRING DIAG: M81.0 (ICD-10-CM) - Age-related osteoporosis without current pathological fracture   THERAPY DIAG:  Radiculopathy, cervical region  Abnormal posture  Other low back pain  Other abnormalities of gait and mobility  Rationale for Evaluation and Treatment Rehabilitation  SUBJECTIVE:                                                                                                                                                                                               SUBJECTIVE STATEMENT: Pt reports she is feeling achy today in her low back in sitting and in RLE with standing from doing her HEP yesterday. Reports 6/10 pain.  Pt accompanied by: self  PERTINENT HISTORY: HTN, aortic atherosclerosis, hypothyroidism, chronic pain, prediabetes  PAIN:  Are you having pain? Yes: NPRS scale: 6/10 Pain location: low back, RlE Pain description: achy Aggravating factors: standing for extended period of time; mornings are worse-hard to get out of bed Relieving factors: gabapentin  PRECAUTIONS: Other: Osteoporosis  WEIGHT BEARING RESTRICTIONS No  FALLS: Has patient fallen in last 6 months? No  LIVING ENVIRONMENT: Lives with: lives alone Lives in: House/apartment Stairs: No Has following equipment at home: Gilford Rile - 2 wheeled, shower chair, and Grab bars  PLOF: Independent with gait and Independent with transfers  PATIENT GOALS:  "to strengthen my bones" "to avoid going on medicine to strengthen my bones if possible"  OBJECTIVE:   TODAY'S TREATMENT:  THER EX: Tried lunges at countertop, onset of R thigh muscle pain/strain and R lateral low back pain during exercise  Core strengthening: Supine bridges x 10 reps with 5 sec hold Supine LE 90/90 TA with marches 2 x 5 reps Quadruped alt UE lifts, alt LE lifts x 10 reps each (bird dogs) Added to HEP, see bolded below  Mat to/from floor transfer at mod I level with good ability to perform transfer safely with use of mat table for support  SciFit multi-peaks level 2 for 8 minutes using BUE/BLEs for neural priming for reciprocal movement, dynamic cardiovascular warmup and increased amplitude of stepping. RPE of 6/10 following activity.   PATIENT EDUCATION: Education details: adjusted  HEP, new handout provided Person educated: Patient Education method: Explanation and Handouts Education comprehension: verbalized understanding   HOME EXERCISE  PROGRAM: Access Code: [email or web address] URL: [email or web address] Date: 08/30/2022 Prepared by: Elease Etienne  Exercises - Side Stepping with Resistance at Ankles and Counter Support  - 1 x daily - 7 x weekly - 3 sets - 10 reps - Scapular retraction with resistance  - 1 x daily - 7 x weekly - 3 sets - 10 reps - Bird Dog  - 1 x daily - 7 x weekly - 3 sets - 10 reps - n/a hold - Full Leg Press  - 1 x daily - 7 x weekly - 3 sets - 10 reps - Supine Bridge  - 1 x daily - 7 x weekly - 3 sets - 10 reps - 5 hold - Supine 90/90 Alternating Heel Touches with Posterior Pelvic Tilt  - 1 x daily - 7 x weekly - 3 sets - 10 reps  GOALS: Goals reviewed with patient? Yes  SHORT TERM GOALS=LTG due to length of POC   LONG TERM GOALS: Target date: 10/04/2022  Pt will be independent with final HEP for improved strength, balance, transfers and gait. Baseline:  Goal status: INITIAL  2.  Pt will improve score on NDI to 17/50 to demonstrate decreased disability level Baseline: 22/50 (9/29) Goal status: INITIAL  3.  Pt will improve gait velocity to at least 3.5 ft/sec for improved gait efficiency and performance at Independent level  Baseline: 2.47 ft/sec (9/29) Goal status: INITIAL  4.  Pt will improve FGA to 19/30 for decreased fall risk  Baseline: 13/30 (10/2) Goal status: INITIAL   ASSESSMENT:  CLINICAL IMPRESSION: Emphasis of skilled PT session on working on adjusting HEP based on pt's current ability and fitness level. Pt continues to exhibit decreased strength in her core and LE and continues to benefit from strengthening exercises to address this as well as continuing to work towards building an HEP that can assist her with building her bone strength. Discussed use of leg press machine vs squats due to ongoing difficulty performing this exercise and decreased balance in standing. Continue POC.   OBJECTIVE IMPAIRMENTS decreased balance, decreased endurance, decreased knowledge of  condition, impaired perceived functional ability, impaired sensation, impaired UE functional use, improper body mechanics, postural dysfunction, and pain.   ACTIVITY LIMITATIONS carrying, lifting, bending, squatting, stairs, transfers, and bed mobility  PARTICIPATION LIMITATIONS: meal prep, cleaning, laundry, and community activity  PERSONAL FACTORS Age, Time since onset of injury/illness/exacerbation, and 1-2 comorbidities:    HTN, aortic atherosclerosis, hypothyroidism, chronic pain, prediabetesare also affecting patient's functional outcome.   REHAB POTENTIAL: Good  CLINICAL DECISION MAKING: Stable/uncomplicated  EVALUATION COMPLEXITY: Low  PLAN: PT FREQUENCY: 2x/week  PT DURATION: other: 13 visits (with PT evaluation)  PLANNED INTERVENTIONS: Therapeutic exercises, Therapeutic activity, Neuromuscular re-education, Balance training, Gait training, Patient/Family education, Self Care, Joint mobilization, Joint manipulation, Stair training, Vestibular training, Canalith repositioning, Visual/preceptual remediation/compensation, Orthotic/Fit training, DME instructions, Aquatic Therapy, Dry Needling, Electrical stimulation, Spinal manipulation, Spinal mobilization, Cryotherapy, Moist heat, Taping, Manual therapy, and Re-evaluation  PLAN FOR NEXT SESSION: add to HEP/adjust HEP for strengthening/WB for osteoporosis (leg press vs squats), core strengthening (no crunches, sit-ups, twisting), modified push-ups/plank, monster walks, higher level balance, surge, resisted sit to stand, static stance on airex vs rockerboard    Excell Seltzer, PT, DPT, CSRS 09/04/2022, 1:57 PM

## 2022-09-09 ENCOUNTER — Encounter: Payer: Self-pay | Admitting: Physical Therapy

## 2022-09-09 ENCOUNTER — Encounter: Payer: Medicare Other | Admitting: Family Medicine

## 2022-09-09 ENCOUNTER — Ambulatory Visit: Payer: Medicare Other | Admitting: Physical Therapy

## 2022-09-09 DIAGNOSIS — R293 Abnormal posture: Secondary | ICD-10-CM

## 2022-09-09 DIAGNOSIS — M5412 Radiculopathy, cervical region: Secondary | ICD-10-CM | POA: Diagnosis not present

## 2022-09-09 DIAGNOSIS — M5459 Other low back pain: Secondary | ICD-10-CM

## 2022-09-09 DIAGNOSIS — R2689 Other abnormalities of gait and mobility: Secondary | ICD-10-CM

## 2022-09-09 NOTE — Therapy (Signed)
OUTPATIENT PHYSICAL THERAPY NEURO TREATMENT   Patient Name: [name] [name] MRN: [id number] DOB:[birth year], 76 y.o., female Today's Date: 09/09/2022   PCP: Tawnya Crook, MD REFERRING PROVIDER: Delrae Rend, MD    PT End of Session - 09/09/22 1408     Visit Number 6    Number of Visits 13   with eval   Date for PT Re-Evaluation 10/18/22   to allow for scheduling conflicts   Authorization Type [employer] Medicare    Progress Note Due on Visit 10    PT Start Time 1405    PT Stop Time 1443    PT Time Calculation (min) 38 min    Equipment Utilized During Treatment Gait belt    Activity Tolerance Patient tolerated treatment well    Behavior During Therapy WFL for tasks assessed/performed                Past Medical History:  Diagnosis Date   Abnormal Pap smear    Abnormal peripheral vision    RT EYE   Allergy    Anemia    Arthritis    back and neck   Breast cancer ([place]) 1997   right breast   Bruises easily    Cataracts, bilateral    Chronic back pain    Chronic neck pain    Complication of anesthesia    slow to wake up   Family history of adverse reaction to anesthesia    Dad takes a long time to wake up   Frequency of urination    GERD (gastroesophageal reflux disease)    Glaucoma    Goiter    High cholesterol    History of postoperative nausea and vomiting    Hypertension    Hypothyroidism    Neuromuscular disorder (HCC)    Numbness    hands / feet /arms / legs - followed by Dr. Georgann Housekeeper - [place]   PONV (postoperative nausea and vomiting)    Post-menopausal    Sexual dysfunction    Shortness of breath    with exertion and Wt gain   Urinary incontinence    Past Surgical History:  Procedure Laterality Date   BIOPSY THYROID     BREAST SURGERY Right    lymph node removed   CATARACT EXTRACTION, BILATERAL  2009,2010   COLONOSCOPY  02/15/2014   Dr.Stark   EYE SURGERY  08/2012   detached retina surg   LAPAROSCOPY  1978   BTL   lumpectomy   1997   Rt   neck fusion  2010   THYROIDECTOMY N/A 05/04/2013   Procedure:  TOTAL THYROIDECTOMY;  Surgeon: Earnstine Regal, MD;  Location: [facility];  Service: General;  Laterality: N/A;   TOTAL HIP ARTHROPLASTY Right 01/02/2021   Procedure: TOTAL HIP ARTHROPLASTY ANTERIOR APPROACH;  Surgeon: Paralee Cancel, MD;  Location: [facility];  Service: Orthopedics;  Laterality: Right;  70 mins   TUBAL LIGATION     Patient Active Problem List   Diagnosis Date Noted   Prediabetes 06/26/2022   Aortic atherosclerosis ([place]) 12/26/2021   Polyp of colon 12/26/2021   Familial hypercholesterolemia 10/28/2021   Osteoarthritis of right hip 01/02/2021   Status post right hip replacement 01/02/2021   Right sided weakness 10/28/2013   Cervical dystonia 10/28/2013   Chronic pain 10/28/2013   S/P total thyroidectomy 08/03/2013   Hypothyroidism 08/03/2013   Essential hypertension, benign 06/30/2013   Other forms of retinal detachment(361.89) 01/18/2013   Urinary incontinence, mixed 01/18/2013   Unspecified  constipation 01/18/2013   HTN (hypertension) 12/28/2012   Family history of colon cancer 05/30/2012   DJD (degenerative joint disease), cervical 05/30/2012   Multiple thyroid nodules 04/06/2012   Glaucoma 03/19/2012   Former smoker 03/19/2012   Chronic pain following surgery or procedure 03/19/2012   Breast calcifications on mammogram 03/19/2012   Anemia    Breast cancer ([place])    Cataracts, bilateral    Depression    High cholesterol     ONSET DATE: 07/31/2022   REFERRING DIAG: M81.0 (ICD-10-CM) - Age-related osteoporosis without current pathological fracture   THERAPY DIAG:  Radiculopathy, cervical region  Abnormal posture  Other low back pain  Other abnormalities of gait and mobility  Rationale for Evaluation and Treatment Rehabilitation  SUBJECTIVE:                                                                                                                                                                                               SUBJECTIVE STATEMENT: Pt reports she has been traveling/driving a lot the past several days as she has spent time with daughter and granddaughter.  She did 4 line dancing classes within the last week.  She also painted some in her house.  Due to these activities she has not prioritized her HEP-PT endorsed regular activity as a good thing in absence of HEP.  Pt accompanied by: self  PERTINENT HISTORY: HTN, aortic atherosclerosis, hypothyroidism, chronic pain, prediabetes  PAIN:  Are you having pain? Yes: NPRS scale: 5/10 Pain location: low back-right mostly Pain description: achy Aggravating factors: standing for extended period of time; mornings are worse-hard to get out of bed Relieving factors: gabapentin  PRECAUTIONS: Other: Osteoporosis  WEIGHT BEARING RESTRICTIONS No  FALLS: Has patient fallen in last 6 months? No  LIVING ENVIRONMENT: Lives with: lives alone Lives in: House/apartment Stairs: No Has following equipment at home: Gilford Rile - 2 wheeled, shower chair, and Grab bars  PLOF: Independent with gait and Independent with transfers  PATIENT GOALS:  "to strengthen my bones" "to avoid going on medicine to strengthen my bones if possible"  OBJECTIVE:   TODAY'S TREATMENT:  -STS w/ 10# slamball pushes to target x8, mild discomfort in upper trap region -10# slamball from below shoulder height x8, consistent cues to prevent pt from simply dropping ball and instead using force to push ball to ground -Bil leg press 12x60# > 12 x80#, pt initially inquiring about relevance of leg press vs SciFit so edu provided on benefits relevant to each piece of equipment, pt requires max multimodal cuing to prevent hyperextension and engage full ROM to task -Half kneel to  standing x5 each side using unilateral support on mat, good immediate standing balance noted -Half kneel 3.3lb wt ball trunk rotations x10 each side, pt initially off balance, sway  leveled out by last rotation -Attempted bodyblade in narrow stance on airex w/ pt having difficulty obtaining rhythm despite cuing > tried tandem on airex w/ pt refusing activity due to feeling like she could fall, PT attempts to offer alternative guarding suggestions and task modifications w/ pt continuing to be reluctant to attempt so activity d/c'd -Blue mat under feet staggered STS x10 each LE; pt reporting that she cannot do this exercise due to not having good balance, PT provides edu on modifications to activity and how to best engage muscles to promote balance w/ task.  PATIENT EDUCATION: Education details: Continue dance classes, daily tasks, and HEP as able. Person educated: Patient Education method: Explanation and Handouts Education comprehension: verbalized understanding   HOME EXERCISE PROGRAM: Access Code: [email or web address] URL: [email or web address] Date: 08/30/2022 Prepared by: Elease Etienne  Exercises - Side Stepping with Resistance at Ankles and Counter Support  - 1 x daily - 7 x weekly - 3 sets - 10 reps - Scapular retraction with resistance  - 1 x daily - 7 x weekly - 3 sets - 10 reps - Bird Dog  - 1 x daily - 7 x weekly - 3 sets - 10 reps - n/a hold - Full Leg Press  - 1 x daily - 7 x weekly - 3 sets - 10 reps - Supine Bridge  - 1 x daily - 7 x weekly - 3 sets - 10 reps - 5 hold - Supine 90/90 Alternating Heel Touches with Posterior Pelvic Tilt  - 1 x daily - 7 x weekly - 3 sets - 10 reps  GOALS: Goals reviewed with patient? Yes  SHORT TERM GOALS=LTG due to length of POC   LONG TERM GOALS: Target date: 10/04/2022  Pt will be independent with final HEP for improved strength, balance, transfers and gait. Baseline:  Goal status: INITIAL  2.  Pt will improve score on NDI to 17/50 to demonstrate decreased disability level Baseline: 22/50 (9/29) Goal status: INITIAL  3.  Pt will improve gait velocity to at least 3.5 ft/sec for improved gait efficiency  and performance at Independent level  Baseline: 2.47 ft/sec (9/29) Goal status: INITIAL  4.  Pt will improve FGA to 19/30 for decreased fall risk  Baseline: 13/30 (10/2) Goal status: INITIAL   ASSESSMENT:  CLINICAL IMPRESSION: Focus of skilled session today on continuing to address imbalance and weakness of LE.  Pt continues to have frequent inquiry about tasks w/ notable reluctance requiring max encouragement to engage to task.  She remains challenging by narrowed and tandem BOS as well as hip weakness.  She is tolerating all challenges well in clinic with cuing for proper engagement.  Will continue to progress towards LTGs w/ ongoing PT POC as able.   OBJECTIVE IMPAIRMENTS decreased balance, decreased endurance, decreased knowledge of condition, impaired perceived functional ability, impaired sensation, impaired UE functional use, improper body mechanics, postural dysfunction, and pain.   ACTIVITY LIMITATIONS carrying, lifting, bending, squatting, stairs, transfers, and bed mobility  PARTICIPATION LIMITATIONS: meal prep, cleaning, laundry, and community activity  PERSONAL FACTORS Age, Time since onset of injury/illness/exacerbation, and 1-2 comorbidities:    HTN, aortic atherosclerosis, hypothyroidism, chronic pain, prediabetesare also affecting patient's functional outcome.   REHAB POTENTIAL: Good  CLINICAL DECISION MAKING: Stable/uncomplicated  EVALUATION COMPLEXITY: Low  PLAN: PT FREQUENCY: 2x/week  PT  DURATION: other: 13 visits (with PT evaluation)  PLANNED INTERVENTIONS: Therapeutic exercises, Therapeutic activity, Neuromuscular re-education, Balance training, Gait training, Patient/Family education, Self Care, Joint mobilization, Joint manipulation, Stair training, Vestibular training, Canalith repositioning, Visual/preceptual remediation/compensation, Orthotic/Fit training, DME instructions, Aquatic Therapy, Dry Needling, Electrical stimulation, Spinal manipulation, Spinal  mobilization, Cryotherapy, Moist heat, Taping, Manual therapy, and Re-evaluation  PLAN FOR NEXT SESSION: add to HEP/adjust HEP prn for strengthening/WB for osteoporosis (leg press vs squats), core strengthening (no crunches, sit-ups, twisting), modified push-ups/plank, monster walks, higher level balance, surge, resisted sit to stand, static stance on airex vs rockerboard    Bary Richard, PT, DPT 09/09/2022, 5:17 PM

## 2022-09-11 ENCOUNTER — Ambulatory Visit: Payer: Medicare Other | Admitting: Physical Therapy

## 2022-09-11 DIAGNOSIS — M5412 Radiculopathy, cervical region: Secondary | ICD-10-CM

## 2022-09-11 DIAGNOSIS — R2689 Other abnormalities of gait and mobility: Secondary | ICD-10-CM

## 2022-09-11 DIAGNOSIS — M5459 Other low back pain: Secondary | ICD-10-CM

## 2022-09-11 DIAGNOSIS — R293 Abnormal posture: Secondary | ICD-10-CM

## 2022-09-11 NOTE — Therapy (Signed)
OUTPATIENT PHYSICAL THERAPY NEURO TREATMENT   Patient Name: Jamilah Jean MRN: [id number] DOB:21-Feb-1946, 76 y.o., female Today's Date: 09/11/2022   PCP: Tawnya Crook, MD REFERRING PROVIDER: Delrae Rend, MD    PT End of Session - 09/11/22 1407     Visit Number 7    Number of Visits 13   with eval   Date for PT Re-Evaluation 10/18/22   to allow for scheduling conflicts   Authorization Type [employer] Medicare    Progress Note Due on Visit 10    PT Start Time 1406   therapist ran over with previous patient   PT Stop Time 1445    PT Time Calculation (min) 39 min    Equipment Utilized During Treatment --    Activity Tolerance Patient tolerated treatment well    Behavior During Therapy WFL for tasks assessed/performed                 Past Medical History:  Diagnosis Date   Abnormal Pap smear    Abnormal peripheral vision    RT EYE   Allergy    Anemia    Arthritis    back and neck   Breast cancer ([place]) 1997   right breast   Bruises easily    Cataracts, bilateral    Chronic back pain    Chronic neck pain    Complication of anesthesia    slow to wake up   Family history of adverse reaction to anesthesia    Dad takes a long time to wake up   Frequency of urination    GERD (gastroesophageal reflux disease)    Glaucoma    Goiter    High cholesterol    History of postoperative nausea and vomiting    Hypertension    Hypothyroidism    Neuromuscular disorder (HCC)    Numbness    hands / feet /arms / legs - followed by Dr. Georgann Housekeeper [place]   PONV (postoperative nausea and vomiting)    Post-menopausal    Sexual dysfunction    Shortness of breath    with exertion and Wt gain   Urinary incontinence    Past Surgical History:  Procedure Laterality Date   BIOPSY THYROID     BREAST SURGERY Right    lymph node removed   CATARACT EXTRACTION, BILATERAL  2009,2010   COLONOSCOPY  02/15/2014   Dr.Stark   EYE SURGERY  08/2012   detached retina surg    LAPAROSCOPY  1978   BTL   lumpectomy  1997   Rt   neck fusion  2010   THYROIDECTOMY N/A 05/04/2013   Procedure:  TOTAL THYROIDECTOMY;  Surgeon: Earnstine Regal, MD;  Location: [facility];  Service: General;  Laterality: N/A;   TOTAL HIP ARTHROPLASTY Right 01/02/2021   Procedure: TOTAL HIP ARTHROPLASTY ANTERIOR APPROACH;  Surgeon: Paralee Cancel, MD;  Location: [facility];  Service: Orthopedics;  Laterality: Right;  70 mins   TUBAL LIGATION     Patient Active Problem List   Diagnosis Date Noted   Prediabetes 06/26/2022   Aortic atherosclerosis ([place]) 12/26/2021   Polyp of colon 12/26/2021   Familial hypercholesterolemia 10/28/2021   Osteoarthritis of right hip 01/02/2021   Status post right hip replacement 01/02/2021   Right sided weakness 10/28/2013   Cervical dystonia 10/28/2013   Chronic pain 10/28/2013   S/P total thyroidectomy 08/03/2013   Hypothyroidism 08/03/2013   Essential hypertension, benign 06/30/2013   Other forms of retinal detachment(361.89) 01/18/2013  Urinary incontinence, mixed 01/18/2013   Unspecified constipation 01/18/2013   HTN (hypertension) 12/28/2012   Family history of colon cancer 05/30/2012   DJD (degenerative joint disease), cervical 05/30/2012   Multiple thyroid nodules 04/06/2012   Glaucoma 03/19/2012   Former smoker 03/19/2012   Chronic pain following surgery or procedure 03/19/2012   Breast calcifications on mammogram 03/19/2012   Anemia    Breast cancer ([place])    Cataracts, bilateral    Depression    High cholesterol     ONSET DATE: 07/31/2022   REFERRING DIAG: M81.0 (ICD-10-CM) - Age-related osteoporosis without current pathological fracture   THERAPY DIAG:  Radiculopathy, cervical region  Abnormal posture  Other low back pain  Other abnormalities of gait and mobility  Rationale for Evaluation and Treatment Rehabilitation  SUBJECTIVE:                                                                                                                                                                                               SUBJECTIVE STATEMENT: Pt reports feeling very sore after last session in her R side, feels a catch in her low back/hip region. Pt believes it was progression to 10# weight that caused this soreness as well as increase in weight on leg press and performing half-kneel exercise. Pt reports she had difficulty getting in/out of car after last session that she continues to have difficulty with today.  Pt accompanied by: self  PERTINENT HISTORY: HTN, aortic atherosclerosis, hypothyroidism, chronic pain, prediabetes  PAIN:  Are you having pain? Yes: NPRS scale: 6/10 Pain location: low back-right mostly Pain description: achy Aggravating factors: standing for extended period of time; mornings are worse-hard to get out of bed, trying to get in/out of car Relieving factors: gabapentin  PRECAUTIONS: Other: Osteoporosis  WEIGHT BEARING RESTRICTIONS No  FALLS: Has patient fallen in last 6 months? No  LIVING ENVIRONMENT: Lives with: lives alone Lives in: House/apartment Stairs: No Has following equipment at home: Gilford Rile - 2 wheeled, shower chair, and Grab bars  PLOF: Independent with gait and Independent with transfers  PATIENT GOALS:  "to strengthen my bones" "to avoid going on medicine to strengthen my bones if possible"  OBJECTIVE:   TODAY'S TREATMENT:    THER EX:   Added stretches to HEP, see bolded below    THER ACT:   In // bars: Sidesteps on foam 3 x 10 ft each direction with no UE support Progression to foam sidesteps with alt L/R gumdrop taps 3 x 10 ft with no UE support Pt has several posterior LOB that she requires stepping strategy and UE support on //  bars to recover     PATIENT EDUCATION: Education details: Continue dance classes, daily tasks, and HEP as able. Person educated: Patient Education method: Explanation and Handouts Education comprehension: verbalized understanding   HOME  EXERCISE PROGRAM: Access Code: [email or web address] URL: [email or web address] Date: 08/30/2022 Prepared by: Elease Etienne  Exercises - Side Stepping with Resistance at Ankles and Counter Support  - 1 x daily - 7 x weekly - 3 sets - 10 reps - Scapular retraction with resistance  - 1 x daily - 7 x weekly - 3 sets - 10 reps - Bird Dog  - 1 x daily - 7 x weekly - 3 sets - 10 reps - n/a hold - Full Leg Press  - 1 x daily - 7 x weekly - 3 sets - 10 reps - Supine Bridge  - 1 x daily - 7 x weekly - 3 sets - 10 reps - 5 hold - Supine 90/90 Alternating Heel Touches with Posterior Pelvic Tilt  - 1 x daily - 7 x weekly - 3 sets - 10 reps - Supine Lower Trunk Rotation  - 1 x daily - 7 x weekly - 2 sets - 10 reps - Supine Single Knee to Chest Stretch  - 1-2 x daily - 7 x weekly - 1 sets - 5 reps - 30-60 hold - Supine Figure 4 Piriformis Stretch  - 1 x daily - 7 x weekly - 1 sets - 5 reps - 30-60 sec hold - Seated Piriformis Stretch with Trunk Bend  - 1 x daily - 7 x weekly - 1 sets - 5 reps - 30-60 sec hold  GOALS: Goals reviewed with patient? Yes  SHORT TERM GOALS=LTG due to length of POC   LONG TERM GOALS: Target date: 10/04/2022  Pt will be independent with final HEP for improved strength, balance, transfers and gait. Baseline:  Goal status: INITIAL  2.  Pt will improve score on NDI to 17/50 to demonstrate decreased disability level Baseline: 22/50 (9/29) Goal status: INITIAL  3.  Pt will improve gait velocity to at least 3.5 ft/sec for improved gait efficiency and performance at Independent level  Baseline: 2.47 ft/sec (9/29) Goal status: INITIAL  4.  Pt will improve FGA to 19/30 for decreased fall risk  Baseline: 13/30 (10/2) Goal status: INITIAL   ASSESSMENT:  CLINICAL IMPRESSION: Emphasis of skilled PT session on adding in low back and hip stretches due to increase in pain this date that stemmed from overworking in last PT session. Continue POC.    OBJECTIVE  IMPAIRMENTS decreased balance, decreased endurance, decreased knowledge of condition, impaired perceived functional ability, impaired sensation, impaired UE functional use, improper body mechanics, postural dysfunction, and pain.   ACTIVITY LIMITATIONS carrying, lifting, bending, squatting, stairs, transfers, and bed mobility  PARTICIPATION LIMITATIONS: meal prep, cleaning, laundry, and community activity  PERSONAL FACTORS Age, Time since onset of injury/illness/exacerbation, and 1-2 comorbidities:    HTN, aortic atherosclerosis, hypothyroidism, chronic pain, prediabetesare also affecting patient's functional outcome.   REHAB POTENTIAL: Good  CLINICAL DECISION MAKING: Stable/uncomplicated  EVALUATION COMPLEXITY: Low  PLAN: PT FREQUENCY: 2x/week  PT DURATION: other: 13 visits (with PT evaluation)  PLANNED INTERVENTIONS: Therapeutic exercises, Therapeutic activity, Neuromuscular re-education, Balance training, Gait training, Patient/Family education, Self Care, Joint mobilization, Joint manipulation, Stair training, Vestibular training, Canalith repositioning, Visual/preceptual remediation/compensation, Orthotic/Fit training, DME instructions, Aquatic Therapy, Dry Needling, Electrical stimulation, Spinal manipulation, Spinal mobilization, Cryotherapy, Moist heat, Taping, Manual therapy, and Re-evaluation  PLAN FOR NEXT SESSION: add to HEP/adjust HEP prn  for strengthening/WB for osteoporosis (leg press vs squats), core strengthening (no crunches, sit-ups, twisting), modified push-ups/plank, monster walks, higher level balance, surge, resisted sit to stand, static stance on airex vs rockerboard***    Excell Seltzer, PT, DPT, CSRS 09/11/2022, 2:45 PM

## 2022-09-23 ENCOUNTER — Ambulatory Visit: Payer: Medicare Other | Admitting: Physical Therapy

## 2022-09-23 DIAGNOSIS — R2689 Other abnormalities of gait and mobility: Secondary | ICD-10-CM

## 2022-09-23 DIAGNOSIS — M5412 Radiculopathy, cervical region: Secondary | ICD-10-CM | POA: Diagnosis not present

## 2022-09-23 DIAGNOSIS — R293 Abnormal posture: Secondary | ICD-10-CM

## 2022-09-23 DIAGNOSIS — M5459 Other low back pain: Secondary | ICD-10-CM

## 2022-09-23 NOTE — Therapy (Signed)
OUTPATIENT PHYSICAL THERAPY NEURO TREATMENT   Patient Name: [name] [name] MRN: [id number] DOB:12-20-1945, 76 y.o., female Today's Date: 09/23/2022   PCP: Tawnya Crook, MD REFERRING PROVIDER: Delrae Rend, MD    PT End of Session - 09/23/22 1317     Visit Number 8    Number of Visits 13   with eval   Date for PT Re-Evaluation 10/18/22   to allow for scheduling conflicts   Authorization Type [employer] Medicare    Progress Note Due on Visit 10    PT Start Time 1315    PT Stop Time 1356    PT Time Calculation (min) 41 min    Equipment Utilized During Treatment Gait belt    Activity Tolerance Patient tolerated treatment well    Behavior During Therapy WFL for tasks assessed/performed                  Past Medical History:  Diagnosis Date   Abnormal Pap smear    Abnormal peripheral vision    RT EYE   Allergy    Anemia    Arthritis    back and neck   Breast cancer ([place]) 1997   right breast   Bruises easily    Cataracts, bilateral    Chronic back pain    Chronic neck pain    Complication of anesthesia    slow to wake up   Family history of adverse reaction to anesthesia    Dad takes a long time to wake up   Frequency of urination    GERD (gastroesophageal reflux disease)    Glaucoma    Goiter    High cholesterol    History of postoperative nausea and vomiting    Hypertension    Hypothyroidism    Neuromuscular disorder (HCC)    Numbness    hands / feet /arms / legs - followed by Dr. Georgann Housekeeper - [place]   PONV (postoperative nausea and vomiting)    Post-menopausal    Sexual dysfunction    Shortness of breath    with exertion and Wt gain   Urinary incontinence    Past Surgical History:  Procedure Laterality Date   BIOPSY THYROID     BREAST SURGERY Right    lymph node removed   CATARACT EXTRACTION, BILATERAL  2009,2010   COLONOSCOPY  02/15/2014   Dr.Stark   EYE SURGERY  08/2012   detached retina surg   LAPAROSCOPY  1978   BTL   lumpectomy   1997   Rt   neck fusion  2010   THYROIDECTOMY N/A 05/04/2013   Procedure:  TOTAL THYROIDECTOMY;  Surgeon: Earnstine Regal, MD;  Location: [facility];  Service: General;  Laterality: N/A;   TOTAL HIP ARTHROPLASTY Right 01/02/2021   Procedure: TOTAL HIP ARTHROPLASTY ANTERIOR APPROACH;  Surgeon: Paralee Cancel, MD;  Location: [facility];  Service: Orthopedics;  Laterality: Right;  70 mins   TUBAL LIGATION     Patient Active Problem List   Diagnosis Date Noted   Prediabetes 06/26/2022   Aortic atherosclerosis ([place]) 12/26/2021   Polyp of colon 12/26/2021   Familial hypercholesterolemia 10/28/2021   Osteoarthritis of right hip 01/02/2021   Status post right hip replacement 01/02/2021   Right sided weakness 10/28/2013   Cervical dystonia 10/28/2013   Chronic pain 10/28/2013   S/P total thyroidectomy 08/03/2013   Hypothyroidism 08/03/2013   Essential hypertension, benign 06/30/2013   Other forms of retinal detachment(361.89) 01/18/2013   Urinary incontinence, mixed 01/18/2013  Unspecified constipation 01/18/2013   HTN (hypertension) 12/28/2012   Family history of colon cancer 05/30/2012   DJD (degenerative joint disease), cervical 05/30/2012   Multiple thyroid nodules 04/06/2012   Glaucoma 03/19/2012   Former smoker 03/19/2012   Chronic pain following surgery or procedure 03/19/2012   Breast calcifications on mammogram 03/19/2012   Anemia    Breast cancer ([place])    Cataracts, bilateral    Depression    High cholesterol     ONSET DATE: 07/31/2022   REFERRING DIAG: M81.0 (ICD-10-CM) - Age-related osteoporosis without current pathological fracture   THERAPY DIAG:  Radiculopathy, cervical region  Abnormal posture  Other low back pain  Other abnormalities of gait and mobility  Rationale for Evaluation and Treatment Rehabilitation  SUBJECTIVE:                                                                                                                                                                                               SUBJECTIVE STATEMENT: Pt reports she had a great time on her vacation to the [employer]. Pt reports she did a lot of walking and going up/down stairs on her vacation and felt like she have trouble keeping up at times but no increase in pain. Pt reports she had some soreness after last PT visit but no pain.  Pt accompanied by: self  PERTINENT HISTORY: HTN, aortic atherosclerosis, hypothyroidism, chronic pain, prediabetes  PAIN:  Are you having pain? Yes: NPRS scale: 4-5/10 Pain location: low back-right mostly Pain description: achy Aggravating factors: standing for extended period of time; mornings are worse-hard to get out of bed, trying to get in/out of car Relieving factors: gabapentin  PRECAUTIONS: Other: Osteoporosis  WEIGHT BEARING RESTRICTIONS No  FALLS: Has patient fallen in last 6 months? No  LIVING ENVIRONMENT: Lives with: lives alone Lives in: House/apartment Stairs: No Has following equipment at home: Gilford Rile - 2 wheeled, shower chair, and Grab bars  PLOF: Independent with gait and Independent with transfers  PATIENT GOALS:  "to strengthen my bones" "to avoid going on medicine to strengthen my bones if possible"  OBJECTIVE:   TODAY'S TREATMENT:     THER EX:   Core strengthening with 3.3 lb weighted ball Seated punch-outs 3 x 10 reps Standing OH lifts onto shelf 2 x 15 reps  Leg press 3 x 10 reps, 60# with BLE    THER ACT:   Standing marches on airex 3 x 15 reps with one UE support on back of chair    Static stance on airex performing ball toss with progression to ball bounce with no UE support    -  normal stance    -Romberg stance    -L/R modified tandem stance     PATIENT EDUCATION: Education details: Continue dance classes, daily tasks, and HEP as able. Person educated: Patient Education method: Explanation and Handouts Education comprehension: verbalized understanding   HOME EXERCISE PROGRAM: Access  Code: [email or web address] URL: [email or web address] Date: 08/30/2022 Prepared by: Elease Etienne  Exercises - Side Stepping with Resistance at Ankles and Counter Support  - 1 x daily - 7 x weekly - 3 sets - 10 reps - Scapular retraction with resistance  - 1 x daily - 7 x weekly - 3 sets - 10 reps - Bird Dog  - 1 x daily - 7 x weekly - 3 sets - 10 reps - n/a hold - Full Leg Press  - 1 x daily - 7 x weekly - 3 sets - 10 reps - Supine Bridge  - 1 x daily - 7 x weekly - 3 sets - 10 reps - 5 hold - Supine 90/90 Alternating Heel Touches with Posterior Pelvic Tilt  - 1 x daily - 7 x weekly - 3 sets - 10 reps - Supine Lower Trunk Rotation  - 1 x daily - 7 x weekly - 2 sets - 10 reps - Supine Single Knee to Chest Stretch  - 1-2 x daily - 7 x weekly - 1 sets - 5 reps - 30-60 hold - Supine Figure 4 Piriformis Stretch  - 1 x daily - 7 x weekly - 1 sets - 5 reps - 30-60 sec hold - Seated Piriformis Stretch with Trunk Bend  - 1 x daily - 7 x weekly - 1 sets - 5 reps - 30-60 sec hold  GOALS: Goals reviewed with patient? Yes  SHORT TERM GOALS=LTG due to length of POC   LONG TERM GOALS: Target date: 10/04/2022  Pt will be independent with final HEP for improved strength, balance, transfers and gait. Baseline:  Goal status: INITIAL  2.  Pt will improve score on NDI to 17/50 to demonstrate decreased disability level Baseline: 22/50 (9/29) Goal status: INITIAL  3.  Pt will improve gait velocity to at least 3.5 ft/sec for improved gait efficiency and performance at Independent level  Baseline: 2.47 ft/sec (9/29) Goal status: INITIAL  4.  Pt will improve FGA to 19/30 for decreased fall risk  Baseline: 13/30 (10/2) Goal status: INITIAL   ASSESSMENT:  CLINICAL IMPRESSION: Emphasis of skilled PT session on working on core strengthening and static standing balance on compliant surfaces. Pt remains challenged by weighted core strengthening tasks and by stance on compliant surfaces with  decreased UE support. Pt continues to benefit from skilled therapy services to address ongoing global weakness and decreased balance. Continue POC.   OBJECTIVE IMPAIRMENTS decreased balance, decreased endurance, decreased knowledge of condition, impaired perceived functional ability, impaired sensation, impaired UE functional use, improper body mechanics, postural dysfunction, and pain.   ACTIVITY LIMITATIONS carrying, lifting, bending, squatting, stairs, transfers, and bed mobility  PARTICIPATION LIMITATIONS: meal prep, cleaning, laundry, and community activity  PERSONAL FACTORS Age, Time since onset of injury/illness/exacerbation, and 1-2 comorbidities:    HTN, aortic atherosclerosis, hypothyroidism, chronic pain, prediabetesare also affecting patient's functional outcome.   REHAB POTENTIAL: Good  CLINICAL DECISION MAKING: Stable/uncomplicated  EVALUATION COMPLEXITY: Low  PLAN: PT FREQUENCY: 2x/week  PT DURATION: other: 13 visits (with PT evaluation)  PLANNED INTERVENTIONS: Therapeutic exercises, Therapeutic activity, Neuromuscular re-education, Balance training, Gait training, Patient/Family education, Self Care, Joint mobilization, Joint manipulation, Stair training, Vestibular training, Canalith repositioning, Visual/preceptual  remediation/compensation, Orthotic/Fit training, DME instructions, Aquatic Therapy, Dry Needling, Electrical stimulation, Spinal manipulation, Spinal mobilization, Cryotherapy, Moist heat, Taping, Manual therapy, and Re-evaluation  PLAN FOR NEXT SESSION: add to HEP/adjust HEP prn for strengthening/WB for osteoporosis (leg press vs squats), core strengthening (NO crunches, sit-ups, or twisting), modified push-ups/plank, monster walks, higher level balance, resisted sit to stand, static stance on airex vs rockerboard, stick to 5# weight rather than 10#, weighted dowel, squats on airex?    Excell Seltzer, PT, DPT, CSRS 09/23/2022, 1:58 PM

## 2022-09-25 ENCOUNTER — Ambulatory Visit: Payer: Medicare Other | Attending: Internal Medicine | Admitting: Physical Therapy

## 2022-09-25 DIAGNOSIS — R293 Abnormal posture: Secondary | ICD-10-CM | POA: Diagnosis present

## 2022-09-25 DIAGNOSIS — M5459 Other low back pain: Secondary | ICD-10-CM | POA: Insufficient documentation

## 2022-09-25 DIAGNOSIS — R2689 Other abnormalities of gait and mobility: Secondary | ICD-10-CM | POA: Insufficient documentation

## 2022-09-25 DIAGNOSIS — M5412 Radiculopathy, cervical region: Secondary | ICD-10-CM | POA: Diagnosis not present

## 2022-09-25 NOTE — Therapy (Signed)
OUTPATIENT PHYSICAL THERAPY NEURO TREATMENT   Patient Name: [name] [name] MRN: [id number] DOB:08-05-1946, 76 y.o., female Today's Date: 09/25/2022   PCP: Tawnya Crook, MD REFERRING PROVIDER: Delrae Rend, MD    PT End of Session - 09/25/22 1406     Visit Number 9    Number of Visits 13   with eval   Date for PT Re-Evaluation 10/18/22   to allow for scheduling conflicts   Authorization Type [employer] Medicare    Progress Note Due on Visit 10    PT Start Time 1405    PT Stop Time 1430   pt elects to be done with session   PT Time Calculation (min) 25 min    Equipment Utilized During Treatment Gait belt    Activity Tolerance Patient tolerated treatment well    Behavior During Therapy WFL for tasks assessed/performed                   Past Medical History:  Diagnosis Date   Abnormal Pap smear    Abnormal peripheral vision    RT EYE   Allergy    Anemia    Arthritis    back and neck   Breast cancer ([place]) 1997   right breast   Bruises easily    Cataracts, bilateral    Chronic back pain    Chronic neck pain    Complication of anesthesia    slow to wake up   Family history of adverse reaction to anesthesia    Dad takes a long time to wake up   Frequency of urination    GERD (gastroesophageal reflux disease)    Glaucoma    Goiter    High cholesterol    History of postoperative nausea and vomiting    Hypertension    Hypothyroidism    Neuromuscular disorder (HCC)    Numbness    hands / feet /arms / legs - followed by Dr. Georgann Housekeeper - [place]   PONV (postoperative nausea and vomiting)    Post-menopausal    Sexual dysfunction    Shortness of breath    with exertion and Wt gain   Urinary incontinence    Past Surgical History:  Procedure Laterality Date   BIOPSY THYROID     BREAST SURGERY Right    lymph node removed   CATARACT EXTRACTION, BILATERAL  2009,2010   COLONOSCOPY  02/15/2014   Dr.Stark   EYE SURGERY  08/2012   detached retina surg    LAPAROSCOPY  1978   BTL   lumpectomy  1997   Rt   neck fusion  2010   THYROIDECTOMY N/A 05/04/2013   Procedure:  TOTAL THYROIDECTOMY;  Surgeon: Earnstine Regal, MD;  Location: [facility];  Service: General;  Laterality: N/A;   TOTAL HIP ARTHROPLASTY Right 01/02/2021   Procedure: TOTAL HIP ARTHROPLASTY ANTERIOR APPROACH;  Surgeon: Paralee Cancel, MD;  Location: [facility];  Service: Orthopedics;  Laterality: Right;  70 mins   TUBAL LIGATION     Patient Active Problem List   Diagnosis Date Noted   Prediabetes 06/26/2022   Aortic atherosclerosis ([place]) 12/26/2021   Polyp of colon 12/26/2021   Familial hypercholesterolemia 10/28/2021   Osteoarthritis of right hip 01/02/2021   Status post right hip replacement 01/02/2021   Right sided weakness 10/28/2013   Cervical dystonia 10/28/2013   Chronic pain 10/28/2013   S/P total thyroidectomy 08/03/2013   Hypothyroidism 08/03/2013   Essential hypertension, benign 06/30/2013   Other forms of retinal  detachment(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.89) 01/18/2013   Urinary incontinence, mixed 01/18/2013   Unspecified constipation 01/18/2013   HTN (hypertension) 12/28/2012   Family history of colon cancer 05/30/2012   DJD (degenerative joint disease), cervical 05/30/2012   Multiple thyroid nodules 04/06/2012   Glaucoma 03/19/2012   Former smoker 03/19/2012   Chronic pain following surgery or procedure 03/19/2012   Breast calcifications on mammogram 03/19/2012   Anemia    Breast cancer ([place])    Cataracts, bilateral    Depression    High cholesterol     ONSET DATE: 07/31/2022   REFERRING DIAG: M81.0 (ICD-10-CM) - Age-related osteoporosis without current pathological fracture   THERAPY DIAG:  Radiculopathy, cervical region  Abnormal posture  Other low back pain  Other abnormalities of gait and mobility  Rationale for Evaluation and Treatment Rehabilitation  SUBJECTIVE:                                                                                                                                                                                               SUBJECTIVE STATEMENT: Pt reports she had some soreness in her low back and a "catch" in her back after last session. Pt also reports she took a long car trip which may have contributed to her back pain.  Pt accompanied by: self  PERTINENT HISTORY: HTN, aortic atherosclerosis, hypothyroidism, chronic pain, prediabetes  PAIN:  Are you having pain? Yes: NPRS scale: 4-5/10 Pain location: low back-right mostly Pain description: achy Aggravating factors: standing for extended period of time; mornings are worse-hard to get out of bed, trying to get in/out of car Relieving factors: gabapentin  PRECAUTIONS: Other: Osteoporosis  WEIGHT BEARING RESTRICTIONS No  FALLS: Has patient fallen in last 6 months? No  LIVING ENVIRONMENT: Lives with: lives alone Lives in: House/apartment Stairs: No Has following equipment at home: Gilford Rile - 2 wheeled, shower chair, and Grab bars  PLOF: Independent with gait and Independent with transfers  PATIENT GOALS:  "to strengthen my bones" "to avoid going on medicine to strengthen my bones if possible"  OBJECTIVE:   TODAY'S TREATMENT:     THER EX:   While on supine on mat table on hot pack performed low back stretches for pain relief:   -single knee to chest x 4 reps BLE   -LTR x 15 reps each direction     -child's pose and child's pose with side bend 3 x 30 sec each direction    Added to HEP, see bolded below    THER ACT:   Discussed PT POC and pt agreeable to d/c next session     PATIENT EDUCATION: Education details:  Continue dance classes, daily tasks, and HEP as able. Person educated: Patient Education method: Explanation and Handouts Education comprehension: verbalized understanding   HOME EXERCISE PROGRAM: Access Code: [email or web address] URL: [email or web address] Date: 08/30/2022 Prepared by: Elease Etienne  Exercises - Side Stepping with  Resistance at Ankles and Counter Support  - 1 x daily - 7 x weekly - 3 sets - 10 reps - Scapular retraction with resistance  - 1 x daily - 7 x weekly - 3 sets - 10 reps - Bird Dog  - 1 x daily - 7 x weekly - 3 sets - 10 reps - n/a hold - Full Leg Press  - 1 x daily - 7 x weekly - 3 sets - 10 reps - Supine Bridge  - 1 x daily - 7 x weekly - 3 sets - 10 reps - 5 hold - Supine 90/90 Alternating Heel Touches with Posterior Pelvic Tilt  - 1 x daily - 7 x weekly - 3 sets - 10 reps - Supine Lower Trunk Rotation  - 1 x daily - 7 x weekly - 2 sets - 10 reps - Supine Single Knee to Chest Stretch  - 1-2 x daily - 7 x weekly - 1 sets - 5 reps - 30-60 hold - Supine Figure 4 Piriformis Stretch  - 1 x daily - 7 x weekly - 1 sets - 5 reps - 30-60 sec hold - Seated Piriformis Stretch with Trunk Bend  - 1 x daily - 7 x weekly - 1 sets - 5 reps - 30-60 sec hold - Child's Pose Stretch  - 1 x daily - 7 x weekly - 1 sets - 5 reps - 30-60 sec hold - Child's Pose with Sidebending  - 1 x daily - 7 x weekly - 1 sets - 5 reps - 30-60 sec hold   GOALS: Goals reviewed with patient? Yes  SHORT TERM GOALS=LTG due to length of POC   LONG TERM GOALS: Target date: 10/04/2022  Pt will be independent with final HEP for improved strength, balance, transfers and gait. Baseline:  Goal status: INITIAL  2.  Pt will improve score on NDI to 17/50 to demonstrate decreased disability level Baseline: 22/50 (9/29) Goal status: INITIAL  3.  Pt will improve gait velocity to at least 3.5 ft/sec for improved gait efficiency and performance at Independent level  Baseline: 2.47 ft/sec (9/29) Goal status: INITIAL  4.  Pt will improve FGA to 19/30 for decreased fall risk  Baseline: 13/30 (10/2) Goal status: INITIAL   ASSESSMENT:  CLINICAL IMPRESSION: Emphasis of skilled PT session on working on low back stretches and use of moist heat pack for pain management. Pt reports relief of pain with stretching and modalities this  session. Discussed PT POC and pt goals. Pt agreeable to d/c from PT services next session after goal assessment and review of HEP. Continue POC.   OBJECTIVE IMPAIRMENTS decreased balance, decreased endurance, decreased knowledge of condition, impaired perceived functional ability, impaired sensation, impaired UE functional use, improper body mechanics, postural dysfunction, and pain.   ACTIVITY LIMITATIONS carrying, lifting, bending, squatting, stairs, transfers, and bed mobility  PARTICIPATION LIMITATIONS: meal prep, cleaning, laundry, and community activity  PERSONAL FACTORS Age, Time since onset of injury/illness/exacerbation, and 1-2 comorbidities:    HTN, aortic atherosclerosis, hypothyroidism, chronic pain, prediabetesare also affecting patient's functional outcome.   REHAB POTENTIAL: Good  CLINICAL DECISION MAKING: Stable/uncomplicated  EVALUATION COMPLEXITY: Low  PLAN: PT FREQUENCY: 2x/week  PT DURATION: other: 13 visits (with PT evaluation)  PLANNED INTERVENTIONS: Therapeutic exercises, Therapeutic activity, Neuromuscular re-education, Balance training, Gait training, Patient/Family education, Self Care, Joint mobilization, Joint manipulation, Stair training, Vestibular training, Canalith repositioning, Visual/preceptual remediation/compensation, Orthotic/Fit training, DME instructions, Aquatic Therapy, Dry Needling, Electrical stimulation, Spinal manipulation, Spinal mobilization, Cryotherapy, Moist heat, Taping, Manual therapy, and Re-evaluation  PLAN FOR NEXT SESSION: assess LTG and HEP, pt agreeable to d/c? (cancel remaining appointments if so)    Excell Seltzer, PT, DPT, CSRS 09/25/2022, 2:34 PM

## 2022-09-30 ENCOUNTER — Ambulatory Visit: Payer: Medicare Other | Admitting: Physical Therapy

## 2022-10-02 ENCOUNTER — Ambulatory Visit: Payer: Medicare Other | Admitting: Physical Therapy

## 2022-10-02 DIAGNOSIS — M5459 Other low back pain: Secondary | ICD-10-CM

## 2022-10-02 DIAGNOSIS — M5412 Radiculopathy, cervical region: Secondary | ICD-10-CM

## 2022-10-02 DIAGNOSIS — R293 Abnormal posture: Secondary | ICD-10-CM

## 2022-10-02 DIAGNOSIS — R2689 Other abnormalities of gait and mobility: Secondary | ICD-10-CM

## 2022-10-02 NOTE — Therapy (Signed)
OUTPATIENT PHYSICAL THERAPY NEURO TREATMENT-10TH VISIT PROGRESS NOTE-DISCHARGE NOTE   Patient Name: [name] [name] MRN: [id number] DOB:04/20/1946, 76 y.o., female Today's Date: 10/02/2022   PCP: Tawnya Crook, MD REFERRING PROVIDER: Delrae Rend, MD    PHYSICAL THERAPY DISCHARGE SUMMARY  Visits from Start of Care: 10  Current functional level related to goals / functional outcomes: Independent   Remaining deficits: Ongoing chronic neck pain   Education / Equipment: HEP handout   Patient agrees to discharge. Patient goals were partially met. Patient is being discharged due to maximized rehab potential.    Physical Therapy Progress Note   Dates of Reporting Period:08/23/22 - 10/02/2022  See Note below for Objective Data and Assessment of Progress/Goals.  Thank you for the referral of this patient. Excell Seltzer, PT, DPT, CSRS     PT End of Session - 10/02/22 1318     Visit Number 10    Number of Visits 13   with eval   Date for PT Re-Evaluation 10/18/22   to allow for scheduling conflicts   Authorization Type [employer] Medicare    Progress Note Due on Visit 10    PT Start Time 1317    PT Stop Time 1340   d/c   PT Time Calculation (min) 23 min    Equipment Utilized During Treatment Gait belt    Activity Tolerance Patient tolerated treatment well    Behavior During Therapy WFL for tasks assessed/performed                    Past Medical History:  Diagnosis Date   Abnormal Pap smear    Abnormal peripheral vision    RT EYE   Allergy    Anemia    Arthritis    back and neck   Breast cancer ([place]) 1997   right breast   Bruises easily    Cataracts, bilateral    Chronic back pain    Chronic neck pain    Complication of anesthesia    slow to wake up   Family history of adverse reaction to anesthesia    Dad takes a long time to wake up   Frequency of urination    GERD (gastroesophageal reflux disease)    Glaucoma    Goiter    High  cholesterol    History of postoperative nausea and vomiting    Hypertension    Hypothyroidism    Neuromuscular disorder (HCC)    Numbness    hands / feet /arms / legs - followed by Dr. Georgann Housekeeper - [place]   PONV (postoperative nausea and vomiting)    Post-menopausal    Sexual dysfunction    Shortness of breath    with exertion and Wt gain   Urinary incontinence    Past Surgical History:  Procedure Laterality Date   BIOPSY THYROID     BREAST SURGERY Right    lymph node removed   CATARACT EXTRACTION, BILATERAL  2009,2010   COLONOSCOPY  02/15/2014   Dr.Stark   EYE SURGERY  08/2012   detached retina surg   LAPAROSCOPY  1978   BTL   lumpectomy  1997   Rt   neck fusion  2010   THYROIDECTOMY N/A 05/04/2013   Procedure:  TOTAL THYROIDECTOMY;  Surgeon: Earnstine Regal, MD;  Location: [facility];  Service: General;  Laterality: N/A;   TOTAL HIP ARTHROPLASTY Right 01/02/2021   Procedure: TOTAL HIP ARTHROPLASTY ANTERIOR APPROACH;  Surgeon: Paralee Cancel, MD;  Location: [facility];  Service: Orthopedics;  Laterality: Right;  70 mins   TUBAL LIGATION     Patient Active Problem List   Diagnosis Date Noted   Prediabetes 06/26/2022   Aortic atherosclerosis ([address]) 12/26/2021   Polyp of colon 12/26/2021   Familial hypercholesterolemia 10/28/2021   Osteoarthritis of right hip 01/02/2021   Status post right hip replacement 01/02/2021   Right sided weakness 10/28/2013   Cervical dystonia 10/28/2013   Chronic pain 10/28/2013   S/P total thyroidectomy 08/03/2013   Hypothyroidism 08/03/2013   Essential hypertension, benign 06/30/2013   Other forms of retinal detachment(361.89) 01/18/2013   Urinary incontinence, mixed 01/18/2013   Unspecified constipation 01/18/2013   HTN (hypertension) 12/28/2012   Family history of colon cancer 05/30/2012   DJD (degenerative joint disease), cervical 05/30/2012   Multiple thyroid nodules 04/06/2012   Glaucoma 03/19/2012   Former smoker 03/19/2012   Chronic pain  following surgery or procedure 03/19/2012   Breast calcifications on mammogram 03/19/2012   Anemia    Breast cancer (HCC)    Cataracts, bilateral    Depression    High cholesterol     ONSET DATE: 07/31/2022   REFERRING DIAG: M81.0 (ICD-10-CM) - Age-related osteoporosis without current pathological fracture   THERAPY DIAG:  Radiculopathy, cervical region  Abnormal posture  Other low back pain  Other abnormalities of gait and mobility  Rationale for Evaluation and Treatment Rehabilitation  SUBJECTIVE:                                                                                                                                                                                              SUBJECTIVE STATEMENT: Pt reports feeling tired today after a busy week with her sister visiting and driving to/from [place].  Pt accompanied by: self  PERTINENT HISTORY: HTN, aortic atherosclerosis, hypothyroidism, chronic pain, prediabetes  PAIN:  Are you having pain? Yes: NPRS scale: 3/10 Pain location: low back-right mostly Pain description: achy Aggravating factors: standing for extended period of time; mornings are worse-hard to get out of bed, trying to get in/out of car Relieving factors: gabapentin  PRECAUTIONS: Other: Osteoporosis  WEIGHT BEARING RESTRICTIONS No  FALLS: Has patient fallen in last 6 months? No  LIVING ENVIRONMENT: Lives with: lives alone Lives in: House/apartment Stairs: No Has following equipment at home: Gilford Rile - 2 wheeled, shower chair, and Grab bars  PLOF: Independent with gait and Independent with transfers  PATIENT GOALS:  "to strengthen my bones" "to avoid going on medicine to strengthen my bones if possible"  OBJECTIVE:   TODAY'S TREATMENT:     THER ACT:   Reassessed outcome measures for LTG assessment:  [facility] PT Assessment - 10/02/22 1328       Ambulation/Gait   Gait velocity 32.8 ft over 13 sec = 2.52 ft/sec      Functional Gait   Assessment   Gait assessed  Yes    Gait Level Surface Walks 20 ft in less than 7 sec but greater than 5.5 sec, uses assistive device, slower speed, mild gait deviations, or deviates 6-10 in outside of the 12 in walkway width.    Change in Gait Speed Able to smoothly change walking speed without loss of balance or gait deviation. Deviate no more than 6 in outside of the 12 in walkway width.    Gait with Horizontal Head Turns Performs head turns smoothly with no change in gait. Deviates no more than 6 in outside 12 in walkway width    Gait with Vertical Head Turns Performs head turns with no change in gait. Deviates no more than 6 in outside 12 in walkway width.    Gait and Pivot Turn Pivot turns safely within 3 sec and stops quickly with no loss of balance.    Step Over Obstacle Is able to step over one shoe box (4.5 in total height) without changing gait speed. No evidence of imbalance.    Gait with Narrow Base of Support Ambulates 4-7 steps.    Gait with Eyes Closed Walks 20 ft, uses assistive device, slower speed, mild gait deviations, deviates 6-10 in outside 12 in walkway width. Ambulates 20 ft in less than 9 sec but greater than 7 sec.    Ambulating Backwards Walks 20 ft, uses assistive device, slower speed, mild gait deviations, deviates 6-10 in outside 12 in walkway width.    Steps Alternating feet, no rail.    Total Score 24    FGA comment: 24/30   medium fall risk           NDI: 22/50  PATIENT EDUCATION: Education details: Continue dance classes, daily tasks, and HEP as able. Person educated: Patient Education method: Explanation and Handouts Education comprehension: verbalized understanding   HOME EXERCISE PROGRAM: Access Code: [email or web address] URL: [email or web address] Date: 08/30/2022 Prepared by: Elease Etienne  Exercises - Side Stepping with Resistance at Ankles and Counter Support  - 1 x daily - 7 x weekly - 3 sets - 10 reps - Scapular retraction with  resistance  - 1 x daily - 7 x weekly - 3 sets - 10 reps - Bird Dog  - 1 x daily - 7 x weekly - 3 sets - 10 reps - n/a hold - Full Leg Press  - 1 x daily - 7 x weekly - 3 sets - 10 reps - Supine Bridge  - 1 x daily - 7 x weekly - 3 sets - 10 reps - 5 hold - Supine 90/90 Alternating Heel Touches with Posterior Pelvic Tilt  - 1 x daily - 7 x weekly - 3 sets - 10 reps - Supine Lower Trunk Rotation  - 1 x daily - 7 x weekly - 2 sets - 10 reps - Supine Single Knee to Chest Stretch  - 1-2 x daily - 7 x weekly - 1 sets - 5 reps - 30-60 hold - Supine Figure 4 Piriformis Stretch  - 1 x daily - 7 x weekly - 1 sets - 5 reps - 30-60 sec hold - Seated Piriformis Stretch with Trunk Bend  - 1 x daily - 7 x weekly - 1 sets - 5 reps - 30-60 sec hold - Child's  Pose Stretch  - 1 x daily - 7 x weekly - 1 sets - 5 reps - 30-60 sec hold - Child's Pose with Sidebending  - 1 x daily - 7 x weekly - 1 sets - 5 reps - 30-60 sec hold   GOALS: Goals reviewed with patient? Yes  SHORT TERM GOALS=LTG due to length of POC   LONG TERM GOALS: Target date: 10/04/2022  Pt will be independent with final HEP for improved strength, balance, transfers and gait. Baseline:  Goal status: MET  2.  Pt will improve score on NDI to 17/50 to demonstrate decreased disability level Baseline: 22/50 (9/29), 22/50 (11/8) Goal status: NOT MET  3.  Pt will improve gait velocity to at least 3.5 ft/sec for improved gait efficiency and performance at Independent level  Baseline: 2.47 ft/sec (9/29), 2.52 ft/sec (11/8) Goal status: NOT MET  4.  Pt will improve FGA to 19/30 for decreased fall risk  Baseline: 13/30 (10/2), 24/30 (11/8) Goal status: MET   ASSESSMENT:  CLINICAL IMPRESSION: Emphasis of skilled PT session on reassessing LTG in preparation for d/c from PT this date. Pt has met 2/4 LTG due to being independent with her HEP and improving her FGA score from 13/30 to 24/30 this date, demonstrating improved balance and decreased  fall risk. Pt reported the same score on the NDI 22/50 indicating no change her overall neck pain level but pt does suffer from a chronic neck injury and pain, doesn't exhibit an increase in pain following therapy services. Additionally, pt did increase her gait speed from 2.47 ft/sec to 2.52 ft/sec but did not improve enough to meet her goal of 3.5 ft/sec. Pt is safe to d/c from PT services at this time and continue with her HEP for strengthening to prevent further bone loss.   OBJECTIVE IMPAIRMENTS decreased balance, decreased endurance, decreased knowledge of condition, impaired perceived functional ability, impaired sensation, impaired UE functional use, improper body mechanics, postural dysfunction, and pain.   ACTIVITY LIMITATIONS carrying, lifting, bending, squatting, stairs, transfers, and bed mobility  PARTICIPATION LIMITATIONS: meal prep, cleaning, laundry, and community activity  PERSONAL FACTORS Age, Time since onset of injury/illness/exacerbation, and 1-2 comorbidities:    HTN, aortic atherosclerosis, hypothyroidism, chronic pain, prediabetesare also affecting patient's functional outcome.   REHAB POTENTIAL: Good  CLINICAL DECISION MAKING: Stable/uncomplicated  EVALUATION COMPLEXITY: Low     Excell Seltzer, PT, DPT, CSRS 10/02/2022, 1:41 PM

## 2022-10-05 ENCOUNTER — Other Ambulatory Visit: Payer: Self-pay | Admitting: Family Medicine

## 2022-10-07 ENCOUNTER — Ambulatory Visit: Payer: Medicare Other | Admitting: Physical Therapy

## 2022-10-09 ENCOUNTER — Ambulatory Visit: Payer: Medicare Other | Admitting: Physical Therapy

## 2022-11-07 ENCOUNTER — Encounter: Payer: Self-pay | Admitting: *Deleted

## 2022-11-12 ENCOUNTER — Ambulatory Visit: Payer: Medicare Other | Admitting: Nurse Practitioner

## 2022-11-12 ENCOUNTER — Encounter: Payer: Self-pay | Admitting: Nurse Practitioner

## 2022-11-12 VITALS — BP 100/68 | HR 67 | Temp 98.0°F | Ht 63.0 in | Wt 167.6 lb

## 2022-11-12 DIAGNOSIS — J0110 Acute frontal sinusitis, unspecified: Secondary | ICD-10-CM

## 2022-11-12 MED ORDER — BENZONATATE 100 MG PO CAPS: 100.0000 mg | ORAL_CAPSULE | Freq: Three times a day (TID) | ORAL | 0 refills | Status: DC | PRN

## 2022-11-12 MED ORDER — AZITHROMYCIN 250 MG PO TABS: ORAL_TABLET | ORAL | 0 refills | Status: AC

## 2022-11-12 NOTE — Progress Notes (Signed)
Acute Office Visit  Subjective:     Patient ID: [name] [name], female    DOB: 04-Jul-1946, 76 y.o.   MRN: [id number]  Chief Complaint  Patient presents with   Acute Visit    Congestion. Sinus pressure. OTC sinus meds taken. Slight dry cough. Started last Saturday.    HPI Patient is in today for nasal congestion and sinus pressure for 4 days. Symptoms had started to improve then worsened again.    UPPER RESPIRATORY TRACT INFECTION  Fever: no Cough: yes - dry Shortness of breath: no Wheezing: no Chest pain: no Chest tightness: no Chest congestion: no Nasal congestion: yes Runny nose: no Post nasal drip: yes Sneezing: no Sore throat: yes Swollen glands: no Sinus pressure: yes Headache: yes Face pain: no Toothache: no Ear pain: no bilateral Ear pressure: no bilateral Eyes red/itching:yes Eye drainage/crusting: no  Vomiting: no Rash: no Fatigue: yes Sick contacts: no Strep contacts: no  Context: fluctuating Recurrent sinusitis: no Relief with OTC cold/cough medications:  a little   Treatments attempted: vicks, sinus medication   ROS See pertinent positives and negatives per HPI.     Objective:    BP 100/68 (BP Location: Left Arm, Patient Position: Sitting)   Pulse 67   Temp 98 F (36.7 C) (Oral)   Ht '5\' 3"'$  (1.6 m)   Wt 167 lb 9.6 oz (76 kg)   SpO2 99%   BMI 29.69 kg/m     Physical Exam Vitals and nursing note reviewed.  Constitutional:      General: She is not in acute distress.    Appearance: Normal appearance.  HENT:     Head: Normocephalic.     Right Ear: Tympanic membrane, ear canal and external ear normal.     Left Ear: Tympanic membrane, ear canal and external ear normal.     Nose:     Right Sinus: Frontal sinus tenderness present. No maxillary sinus tenderness.     Left Sinus: Frontal sinus tenderness present. No maxillary sinus tenderness.     Mouth/Throat:     Mouth: Mucous membranes are moist.     Pharynx: Posterior  oropharyngeal erythema present.  Eyes:     Conjunctiva/sclera: Conjunctivae normal.  Cardiovascular:     Rate and Rhythm: Normal rate and regular rhythm.     Pulses: Normal pulses.     Heart sounds: Normal heart sounds.  Pulmonary:     Effort: Pulmonary effort is normal.     Breath sounds: Normal breath sounds.  Musculoskeletal:     Cervical back: Normal range of motion and neck supple. No tenderness.  Lymphadenopathy:     Cervical: No cervical adenopathy.  Skin:    General: Skin is warm.  Neurological:     General: No focal deficit present.     Mental Status: She is alert and oriented to person, place, and time.  Psychiatric:        Mood and Affect: Mood normal.        Behavior: Behavior normal.        Thought Content: Thought content normal.        Judgment: Judgment normal.      Assessment & Plan:   Problem List Items Addressed This Visit   None Visit Diagnoses     Acute non-recurrent frontal sinusitis    -  Primary   Declines covid test today. With symptoms improving then worsening will treat with zpak and tessalon prn cough. Encourage fluids. F/U if not improving  Relevant Medications   benzonatate (TESSALON) 100 MG capsule   azithromycin (ZITHROMAX) 250 MG tablet       Meds ordered this encounter  Medications   benzonatate (TESSALON) 100 MG capsule    Sig: Take 1 capsule (100 mg total) by mouth 3 (three) times daily as needed for cough.    Dispense:  30 capsule    Refill:  0   azithromycin (ZITHROMAX) 250 MG tablet    Sig: Take 2 tablets on day 1, then 1 tablet daily on days 2 through 5    Dispense:  6 tablet    Refill:  0    Return if symptoms worsen or fail to improve.  Charyl Dancer, NP

## 2022-11-12 NOTE — Patient Instructions (Signed)
It was great to see you!  Start zpak 2 tablets today, and then 1 tablet the following days until gone.   You can use flonase nasal spray and saline nasal spray daily.   You can take tessalon as needed for cough  Let's follow-up if your symptoms worsen or don't improve.   Take care,  Vance Peper, NP

## 2022-12-13 ENCOUNTER — Encounter: Payer: Medicare Other | Attending: Internal Medicine | Admitting: Dietician

## 2022-12-13 ENCOUNTER — Encounter: Payer: Self-pay | Admitting: Dietician

## 2022-12-13 DIAGNOSIS — R7303 Prediabetes: Secondary | ICD-10-CM | POA: Insufficient documentation

## 2022-12-13 NOTE — Progress Notes (Signed)
Patient was seen on 12/13/2022 for the Core Session 1 of Diabetes Prevention Program course at [facility]. The following learning objectives were met by the patient during this class:   Learning Objectives:  Be able to explain the purpose and benefits of the National Diabetes Prevention Program.  Be able to describe the events that will take place at every session.  Know the weight loss and physical activity goals established by the [facility] Diabetes Prevention Program.  Know their own individual weight loss and physical activity goals.  Be able to explain the important effect of self-monitoring on behavior change.   Goals:  Record food and beverage intake in "Food and Activity Tracker" over the next week.  Bring completed "Food and Activity Tracker" for session 1 to session 2 next week. Circle the foods or beverages you think are highest in fat and calories in your food tracker. Read the labels on the food you buy, and consider using measuring cups and spoons to help you calculate the amount you eat. We will talk about measuring in more detail in the coming weeks.   Follow-Up Plan: Attend Core Session 2 next week.  Bring completed "Food and Activity Tracker" next week to be reviewed by Lifestyle Coach.

## 2022-12-15 ENCOUNTER — Other Ambulatory Visit: Payer: Self-pay | Admitting: Family Medicine

## 2022-12-20 ENCOUNTER — Encounter: Payer: Self-pay | Admitting: Dietician

## 2022-12-20 ENCOUNTER — Encounter: Payer: Medicare Other | Admitting: Dietician

## 2022-12-20 DIAGNOSIS — R7303 Prediabetes: Secondary | ICD-10-CM

## 2022-12-20 NOTE — Progress Notes (Signed)
Patient was seen on 12/20/2022 for the Core Session 2 of Diabetes Prevention Program course at [facility]. By the end of this session patients are able to complete the following objectives:   Learning Objectives: Self-monitor their weight during the weeks following Session 2.  Describe the relationship between fat and calories.  Explain the reason for, and basic principles of, self-monitoring fat grams and calories.  Identify their personal fat gram goals.  Use the ?Fat and Calorie Counter? to calculate the calories and fat grams of a given selection of foods.  Keep a running total of the fat grams they eat each day.  Calculate fat, calories, and serving sizes from nutrition labels.   Goals:  Weigh yourself at the same time each day, or every few days, and record your weight in your Food and Activity Tracker. Write down everything you eat and drink in your Food and Activity Tracker. Measure portions as much as you can, and start reading labels.  Use the ?Fat and Calorie Counter? to figure out the amount of fat and calories in what you ate, and write the amount down in your Food and Activity Tracker. Keep a running fat gram total throughout the day. Come as close to your fat gram goal as you can.   Follow-Up Plan: Attend Core Session 3 next week.  Bring completed "Food and Activity Tracker" next week to be reviewed by Lifestyle Coach.

## 2022-12-22 ENCOUNTER — Other Ambulatory Visit: Payer: Self-pay | Admitting: Family Medicine

## 2022-12-22 DIAGNOSIS — E890 Postprocedural hypothyroidism: Secondary | ICD-10-CM

## 2022-12-25 ENCOUNTER — Encounter: Payer: Self-pay | Admitting: Family Medicine

## 2022-12-25 ENCOUNTER — Ambulatory Visit (INDEPENDENT_AMBULATORY_CARE_PROVIDER_SITE_OTHER): Payer: Medicare Other | Admitting: Family Medicine

## 2022-12-25 VITALS — BP 136/70 | HR 47 | Temp 97.3°F | Ht 63.0 in | Wt 167.2 lb

## 2022-12-25 DIAGNOSIS — I10 Essential (primary) hypertension: Secondary | ICD-10-CM

## 2022-12-25 DIAGNOSIS — R7303 Prediabetes: Secondary | ICD-10-CM

## 2022-12-25 DIAGNOSIS — E890 Postprocedural hypothyroidism: Secondary | ICD-10-CM

## 2022-12-25 DIAGNOSIS — E7800 Pure hypercholesterolemia, unspecified: Secondary | ICD-10-CM

## 2022-12-25 DIAGNOSIS — G8928 Other chronic postprocedural pain: Secondary | ICD-10-CM

## 2022-12-25 MED ORDER — GABAPENTIN 300 MG PO CAPS: 300.0000 mg | ORAL_CAPSULE | Freq: Three times a day (TID) | ORAL | 3 refills | Status: DC

## 2022-12-25 NOTE — Patient Instructions (Signed)

## 2022-12-25 NOTE — Progress Notes (Signed)
Subjective:     Patient ID: [name] [name], female    DOB: 1946-01-06, 77 y.o.   MRN: [id number]  Chief Complaint  Patient presents with   Follow-up    Pt states had physical but had to cancel due to going out of town    Hypertension   Hypothyroidism    HPI  HTN-Pt is on avapro 300.  Bp's running 120-130/70-80 .  No ha/dizziness/cp/palp/edema/cough/sob  Hypothyroidism-synthroid 88-seeing Dr Buddy Duty tsh in range-reviewed labs HLD-rosuvastatin '5mg'$ .  Some achey-tolerable.  Years ago-myalgias on statin-was seeing Card.  Zetia-aches as well.  Osteoporosis-Prolia-Dr. Buddy Duty PreDM-working on diet-new-seeing nutritionist.  Chronic pain-on gabapentin-mostly holds but w/crestor and prolia-achey.  L hip pain some days. Exercises.    There are no preventive care reminders to display for this patient.  Past Medical History:  Diagnosis Date   Abnormal Pap smear    Abnormal peripheral vision    RT EYE   Allergy    Anemia    Arthritis    back and neck   Breast cancer ([place]) 1997   right breast   Bruises easily    Cataracts, bilateral    Chronic back pain    Chronic neck pain    Complication of anesthesia    slow to wake up   Family history of adverse reaction to anesthesia    Dad takes a long time to wake up   Frequency of urination    GERD (gastroesophageal reflux disease)    Glaucoma    Goiter    High cholesterol    History of postoperative nausea and vomiting    Hypertension    Hypothyroidism    Neuromuscular disorder (HCC)    Numbness    hands / feet /arms / legs - followed by Dr. Georgann Housekeeper - [place]   PONV (postoperative nausea and vomiting)    Post-menopausal    Sexual dysfunction    Shortness of breath    with exertion and Wt gain   Urinary incontinence     Past Surgical History:  Procedure Laterality Date   BIOPSY THYROID     BREAST SURGERY Right    lymph node removed   CATARACT EXTRACTION, BILATERAL  2009,2010   COLONOSCOPY  02/15/2014   Dr.Stark   EYE  SURGERY  08/2012   detached retina surg   LAPAROSCOPY  1978   BTL   lumpectomy  1997   Rt   neck fusion  2010   THYROIDECTOMY N/A 05/04/2013   Procedure:  TOTAL THYROIDECTOMY;  Surgeon: Earnstine Regal, MD;  Location: [facility];  Service: General;  Laterality: N/A;   TOTAL HIP ARTHROPLASTY Right 01/02/2021   Procedure: TOTAL HIP ARTHROPLASTY ANTERIOR APPROACH;  Surgeon: Paralee Cancel, MD;  Location: [facility];  Service: Orthopedics;  Laterality: Right;  70 mins   TUBAL LIGATION      Outpatient Medications Prior to Visit  Medication Sig Dispense Refill   aspirin 81 MG chewable tablet 162 mg 2 (two) times a week.     Biotin w/ Vitamins C & E (HAIR/SKIN/NAILS PO) Take by mouth.     denosumab (PROLIA) 60 MG/ML SOSY injection Inject 60 mg into the skin every 6 (six) months.     dorzolamide-timolol (COSOPT) 22.3-6.8 MG/ML ophthalmic solution Place 1 drop into both eyes 2 (two) times daily.      gabapentin (NEURONTIN) 300 MG capsule Take 1 capsule (300 mg total) by mouth 3 (three) times daily. 270 capsule 3   irbesartan (AVAPRO) 300 MG  tablet TAKE 1 TABLET BY MOUTH DAILY 90 tablet 3   latanoprost (XALATAN) 0.005 % ophthalmic solution Place 1 drop into the left eye at bedtime.     levothyroxine (SYNTHROID) 88 MCG tablet TAKE 1 TABLET BY MOUTH DAILY 90 tablet 3   Multiple Vitamin (MULTIVITAMIN ADULT) TABS Take 1 tablet by mouth daily.     polyvinyl alcohol (LIQUIFILM TEARS) 1.4 % ophthalmic solution Place 1 drop into both eyes daily as needed (for dry eyes).     rosuvastatin (CRESTOR) 5 MG tablet TAKE 1 TABLET BY MOUTH TWICE  WEEKLY 26 tablet 3   benzonatate (TESSALON) 100 MG capsule Take 1 capsule (100 mg total) by mouth 3 (three) times daily as needed for cough. 30 capsule 0   BIOTIN PO Take 1 capsule by mouth daily. (Patient not taking: Reported on 11/12/2022)     No facility-administered medications prior to visit.    Allergies  Allergen Reactions   Erythromycin Nausea Only    Other  reaction(s): Unknown   Ezetimibe     Other reaction(s): myalgias   Other Itching    Unknown Dye PET scan dye possibly= itching   Statins     Other reaction(s): myalgias   ROS neg/noncontributory except as noted HPI/below Alt constipation/diarrhea Neck can get achey-has rod      Objective:     BP 136/70 (BP Location: Left Arm, Patient Position: Sitting)   Pulse (!) 47   Temp (!) 97.3 F (36.3 C) (Temporal)   Ht '5\' 3"'$  (1.6 m)   Wt 167 lb 3.2 oz (75.8 kg)   SpO2 98%   BMI 29.62 kg/m  Wt Readings from Last 3 Encounters:  12/25/22 167 lb 3.2 oz (75.8 kg)  11/12/22 167 lb 9.6 oz (76 kg)  06/26/22 169 lb 4 oz (76.8 kg)    Physical Exam   Gen: WDWN NAD HEENT: NCAT, conjunctiva not injected, sclera nonicteric NECK:  supple, no thyromegaly, no nodes, no carotid bruits CARDIAC:brady RRR, S1S2+,1/6 sys murmur RSB. DP 2+B LUNGS: CTAB. No wheezes ABDOMEN:  BS+, soft, NTND, No HSM, no masses EXT:  no edema MSK: no gross abnormalities.  NEURO: A&O x3.  CN II-XII intact.  PSYCH: normal mood. Good eye contact     Assessment & Plan:   Problem List Items Addressed This Visit       Cardiovascular and Mediastinum   Essential hypertension, benign - Primary     Endocrine   Hypothyroidism     Other   High cholesterol   Chronic pain following surgery or procedure   Prediabetes   HTN-chronic.  Well controlled.  Cont avapro '300mg'$ -renewed few days ago.  Will check cbc,cmp HLD-chronic.  Intol of statins/zetia.  Doing ok on crestor '5mg'$ -check lipids,cmp Hypothyroidism-chronic.  Controlled on synthroid 88.  Renewed recently.  Followed by edo. PreDM-chronic.  Seeing nutritionist now.  Will work on diet/exercise.  Check A1C Chronic pain-staple on gabapentin '300mg'$  tid-renewed Osteoporosis-managed by endo.  On Prolia now.  F/u 6 mo  No orders of the defined types were placed in this encounter.   Wellington Hampshire, MD

## 2022-12-27 ENCOUNTER — Other Ambulatory Visit (INDEPENDENT_AMBULATORY_CARE_PROVIDER_SITE_OTHER): Payer: Medicare Other

## 2022-12-27 ENCOUNTER — Encounter: Payer: Self-pay | Admitting: Dietician

## 2022-12-27 ENCOUNTER — Encounter: Payer: Medicare Other | Attending: Internal Medicine | Admitting: Dietician

## 2022-12-27 DIAGNOSIS — R7303 Prediabetes: Secondary | ICD-10-CM | POA: Insufficient documentation

## 2022-12-27 DIAGNOSIS — E7800 Pure hypercholesterolemia, unspecified: Secondary | ICD-10-CM

## 2022-12-27 DIAGNOSIS — I10 Essential (primary) hypertension: Secondary | ICD-10-CM

## 2022-12-27 LAB — COMPREHENSIVE METABOLIC PANEL
ALT: 12 U/L (ref 0–35)
AST: 15 U/L (ref 0–37)
Albumin: 4.6 g/dL (ref 3.5–5.2)
Alkaline Phosphatase: 68 U/L (ref 39–117)
BUN: 12 mg/dL (ref 6–23)
CO2: 26 mEq/L (ref 19–32)
Calcium: 9 mg/dL (ref 8.4–10.5)
Chloride: 96 mEq/L (ref 96–112)
Creatinine, Ser: 0.62 mg/dL (ref 0.40–1.20)
GFR: 86.87 mL/min (ref >60.00)
Glucose, Bld: 93 mg/dL (ref 70–99)
Potassium: 4.4 mEq/L (ref 3.5–5.1)
Sodium: 130 mEq/L — ABNORMAL LOW (ref 135–145)
Total Bilirubin: 0.8 mg/dL (ref 0.2–1.2)
Total Protein: 7 g/dL (ref 6.0–8.3)

## 2022-12-27 LAB — HEMOGLOBIN A1C: Hgb A1c MFr Bld: 5.6 % (ref 4.6–6.5)

## 2022-12-27 LAB — LIPID PANEL
Cholesterol: 210 mg/dL — ABNORMAL HIGH (ref 0–200)
HDL: 51.5 mg/dL (ref >39.00)
LDL Cholesterol: 132 mg/dL — ABNORMAL HIGH (ref 0–99)
NonHDL: 158.21
Total CHOL/HDL Ratio: 4
Triglycerides: 133 mg/dL (ref 0.0–149.0)
VLDL: 26.6 mg/dL (ref 0.0–40.0)

## 2022-12-27 LAB — CBC WITH DIFFERENTIAL/PLATELET
Basophils Absolute: 0 10*3/uL (ref 0.0–0.1)
Basophils Relative: 0.3 % (ref 0.0–3.0)
Eosinophils Absolute: 0.1 10*3/uL (ref 0.0–0.7)
Eosinophils Relative: 1.6 % (ref 0.0–5.0)
HCT: 38.6 % (ref 36.0–46.0)
Hemoglobin: 13.4 g/dL (ref 12.0–15.0)
Lymphocytes Relative: 39.5 % (ref 12.0–46.0)
Lymphs Abs: 2.4 10*3/uL (ref 0.7–4.0)
MCHC: 34.7 g/dL (ref 30.0–36.0)
MCV: 91 fl (ref 78.0–100.0)
Monocytes Absolute: 0.5 10*3/uL (ref 0.1–1.0)
Monocytes Relative: 7.6 % (ref 3.0–12.0)
Neutro Abs: 3.2 10*3/uL (ref 1.4–7.7)
Neutrophils Relative %: 51 % (ref 43.0–77.0)
Platelets: 274 10*3/uL (ref 150.0–400.0)
RBC: 4.24 Mil/uL (ref 3.87–5.11)
RDW: 12.8 % (ref 11.5–15.5)
WBC: 6.2 10*3/uL (ref 4.0–10.5)

## 2022-12-27 NOTE — Progress Notes (Signed)
Patient was seen on 12/27/2022 for the Core Session 3 of Diabetes Prevention Program course at [facility]. By the end of this session patients are able to complete the following objectives:   Learning Objectives: Weigh and measure foods. Estimate the fat and calorie content of common foods. Describe three ways to eat less fat and fewer calories. Create a plan to eat less fat for the following week.   Goals:  Track weight when weighing outside of class.  Track food and beverages eaten each day in Food and Activity Tracker and include fat grams and calories for each.  Try to stay within fat gram goal.  Complete plan for eating less high fat foods and answer related homework questions.    Follow-Up Plan: Attend Core Session 4 next week.  Bring completed "Food and Activity Tracker" next week to be reviewed by Lifestyle Coach.

## 2022-12-29 NOTE — Progress Notes (Signed)
1.  Cholesterol is better.  Continue crestor 2.  Sodium is low again-is she drinking too much water?  Repeat bmp, urine sodium, urine osm, and serum osm in 1 month

## 2022-12-30 ENCOUNTER — Other Ambulatory Visit: Payer: Self-pay | Admitting: *Deleted

## 2022-12-30 ENCOUNTER — Ambulatory Visit
Admission: EM | Admit: 2022-12-30 | Discharge: 2022-12-30 | Disposition: A | Discharge status: Home / Self Care | Payer: Medicare Other | Attending: Family Medicine | Admitting: Family Medicine

## 2022-12-30 ENCOUNTER — Telehealth: Payer: Self-pay | Admitting: Family Medicine

## 2022-12-30 DIAGNOSIS — I7389 Other specified peripheral vascular diseases: Secondary | ICD-10-CM | POA: Diagnosis not present

## 2022-12-30 DIAGNOSIS — E871 Hypo-osmolality and hyponatremia: Secondary | ICD-10-CM

## 2022-12-30 NOTE — Telephone Encounter (Signed)
Patient states:  - She has noticed her thumb and joints of hands are purple  - Denies any out of the ordinary activities or pain  - She is concerned; she is used to cold hands but nothing like this   Patient has been transferred to triage.

## 2022-12-30 NOTE — ED Triage Notes (Signed)
Pt c/o purple color to hands that started yesterday. First started in thumbs and has spread over the last 24 hours. Denies pain. Spoke to triage nurse at [facility], was recommended to come to Urgent Care.

## 2022-12-30 NOTE — Telephone Encounter (Signed)
Spoke with patient and she stated that she was pulling up at urgent care because the triage nurse told her to go there.

## 2022-12-30 NOTE — ED Provider Notes (Signed)
[facility]    CSN: [id number] Arrival date & time: 12/30/22  1631      History   Chief Complaint Chief Complaint  Patient presents with   Joint Problems    HPI [name] [name] is a 77 y.o. female.   Patient complains of sudden onset of purplish discoloration of her fingers yesterday starting with her thumbs and second fingers. She initially thought she had some type of stain on her fingers, but it would not wipe off.  She then noticed gradual development of darkening in all her fingers, but no discoloration of remainder of hands.  She denies pain or changes in sensation.  She denies recent trauma or changes in activities using her hands.  She has no discoloration elsewhere, including feet/toes, ears, nose, etc.  She feels well otherwise.  She has had no changes in medications other than addition of supplement biotin and beet preparations. She states that her hands feel chronically cold, but have felt more cold during the past two weeks.  She is familiar with Raynaud syndrome, but denies changes in hand color with exposure to cold  The history is provided by the patient.    Past Medical History:  Diagnosis Date   Abnormal Pap smear    Abnormal peripheral vision    RT EYE   Allergy    Anemia    Arthritis    back and neck   Breast cancer ([place]) 1997   right breast   Bruises easily    Cataracts, bilateral    Chronic back pain    Chronic neck pain    Complication of anesthesia    slow to wake up   Family history of adverse reaction to anesthesia    Dad takes a long time to wake up   Frequency of urination    GERD (gastroesophageal reflux disease)    Glaucoma    Goiter    High cholesterol    History of postoperative nausea and vomiting    Hypertension    Hypothyroidism    Neuromuscular disorder (HCC)    Numbness    hands / feet /arms / legs - followed by Dr. 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   Chronic pain 10/28/2013   S/P total thyroidectomy 08/03/2013   Hypothyroidism 08/03/2013   Essential hypertension, benign 06/30/2013   Other forms of retinal detachment(361.89) 01/18/2013   Urinary incontinence, mixed 01/18/2013   Unspecified constipation 01/18/2013   HTN (hypertension) 12/28/2012   Family history of colon cancer 05/30/2012   DJD (degenerative joint disease), cervical 05/30/2012   Multiple thyroid nodules 04/06/2012   Glaucoma 03/19/2012   Former smoker 03/19/2012   Chronic pain following surgery or procedure 03/19/2012   Breast calcifications on mammogram 03/19/2012   Anemia    Breast cancer ([place])    Cataracts, bilateral    Depression    High cholesterol     Past Surgical  History:  Procedure Laterality Date   BIOPSY THYROID     BREAST SURGERY Right    lymph node removed   CATARACT EXTRACTION, BILATERAL  2009,2010   COLONOSCOPY  02/15/2014   Dr.Stark   EYE SURGERY  08/2012   detached retina surg   LAPAROSCOPY  1978   BTL   lumpectomy  1997   Rt   neck fusion  2010   THYROIDECTOMY N/A 05/04/2013   Procedure:  TOTAL THYROIDECTOMY;  Surgeon: Earnstine Regal, MD;  Location: [facility];  Service: General;  Laterality: N/A;   TOTAL HIP ARTHROPLASTY Right 01/02/2021   Procedure: TOTAL HIP ARTHROPLASTY ANTERIOR APPROACH;  Surgeon: Paralee Cancel, MD;  Location: [facility];  Service: Orthopedics;  Laterality: Right;  70 mins   TUBAL LIGATION      OB History     Gravida  3   Para  3    Term  3   Preterm      AB      Living  3      SAB      IAB      Ectopic      Multiple      Live Births               Home Medications    Prior to Admission medications   Medication Sig Start Date End Date Taking? Authorizing Provider  aspirin 81 MG chewable tablet 162 mg 2 (two) times a week. 01/03/21   [provider]  Biotin w/ Vitamins C & E (HAIR/SKIN/NAILS PO) Take by mouth.    [provider]  denosumab (PROLIA) 60 MG/ML SOSY injection Inject 60 mg into the skin every 6 (six) months.    [provider]  dorzolamide-timolol (COSOPT) 22.3-6.8 MG/ML ophthalmic solution Place 1 drop into both eyes 2 (two) times daily.     [provider]  gabapentin (NEURONTIN) 300 MG capsule Take 1 capsule (300 mg total) by mouth 3 (three) times daily. 12/25/22   Tawnya Crook, MD  irbesartan (AVAPRO) 300 MG tablet TAKE 1 TABLET BY MOUTH DAILY 12/16/22   Tawnya Crook, MD  latanoprost (XALATAN) 0.005 % ophthalmic solution Place 1 drop into the left eye at bedtime. 05/04/20   [provider]  levothyroxine (SYNTHROID) 88 MCG tablet TAKE 1 TABLET BY MOUTH DAILY 12/23/22   Tawnya Crook, MD  Multiple Vitamin (MULTIVITAMIN ADULT) TABS Take 1 tablet by mouth daily.    [provider]  polyvinyl alcohol (LIQUIFILM TEARS) 1.4 % ophthalmic solution Place 1 drop into both eyes daily as needed (for dry eyes).    [provider]  rosuvastatin (CRESTOR) 5 MG tablet TAKE 1 TABLET BY MOUTH TWICE  WEEKLY 10/06/22   Tawnya Crook, MD    Family History Family History  Problem Relation Age of Onset   Hyperlipidemia Mother    Heart disease Mother    Diabetes Mother    Hypertension Mother    Hyperlipidemia Father    Colon cancer Father 82   Hearing loss Sister    Hyperlipidemia Sister    Heart disease Sister    Heart disease Sister    Hyperlipidemia Sister    Stroke Sister    Hypertension Sister    Thyroid disease Sister     Hyperlipidemia Brother    Drug abuse Brother    Early death Brother    Esophageal cancer Brother 42       smoker   Diabetes Brother  Heart disease Brother    Hypertension Brother    Heart attack Brother    Early death Brother    Depression Daughter    Cervical cancer Daughter        dx in her 68s   Colon cancer Paternal Aunt 89   Heart disease Maternal Grandmother    Early death Maternal Grandmother    Diabetes Maternal Grandmother    Asthma Maternal Grandmother    Diabetes Paternal Grandmother    Hypertension Paternal Grandmother    Heart disease Paternal Grandmother    Breast cancer Paternal Grandmother        dx in her 53s   Heart disease Paternal Grandfather    Thyroid disease Other 74       niece    Social History Social History   Tobacco Use   Smoking status: Former    Packs/day: 0.50    Years: 3.00    Total pack years: 1.50    Types: Cigarettes    Start date: 01/24/1966    Quit date: 04/27/1968    Years since quitting: 54.7   Smokeless tobacco: Never   Tobacco comments:    quit over 40 years ago  Vaping Use   Vaping Use: Never used  Substance Use Topics   Alcohol use: Yes    Comment: 1 every other month   Drug use: No     Allergies   Erythromycin, Ezetimibe, Other, and Statins   Review of Systems Review of Systems  Constitutional: Negative.   HENT: Negative.    Eyes: Negative.   Respiratory: Negative.    Cardiovascular: Negative.   Gastrointestinal: Negative.   Endocrine: Negative for cold intolerance.  Genitourinary: Negative.   Musculoskeletal:  Negative for arthralgias, joint swelling and myalgias.  Skin:  Positive for color change. Negative for rash.  Neurological:  Negative for weakness and numbness.  Hematological:  Negative for adenopathy.     Physical Exam Triage Vital Signs ED Triage Vitals  Enc Vitals Group     BP 12/30/22 1729 (!) 181/79     Pulse Rate 12/30/22 1729 (!) 50     Resp 12/30/22 1729 17     Temp 12/30/22  1729 97.8 F (36.6 C)     Temp Source 12/30/22 1729 Oral     SpO2 12/30/22 1729 96 %     Weight --      Height --      Head Circumference --      Peak Flow --      Pain Score 12/30/22 1733 0     Pain Loc --      Pain Edu? --      Excl. in [place]? --    No data found.  Updated Vital Signs BP (!) 181/79 (BP Location: Left Arm)   Pulse (!) 50   Temp 97.8 F (36.6 C) (Oral)   Resp 17   SpO2 96%   Visual Acuity Right Eye Distance:   Left Eye Distance:   Bilateral Distance:    Right Eye Near:   Left Eye Near:    Bilateral Near:     Physical Exam Vitals and nursing note reviewed.  Constitutional:      General: She is not in acute distress.    Appearance: She is not ill-appearing.  HENT:     Head: Normocephalic.     Right Ear: External ear normal.     Left Ear: External ear normal.     Nose: Nose normal.  Eyes:  Conjunctiva/sclera: Conjunctivae normal.     Pupils: Pupils are equal, round, and reactive to light.  Cardiovascular:     Rate and Rhythm: Bradycardia present.  Pulmonary:     Effort: Pulmonary effort is normal.  Musculoskeletal:       Hands:     Cervical back: Normal range of motion.     Right lower leg: No edema.     Left lower leg: No edema.     Comments: All fingers are cyanotic, more pronounced on first, second, and third fingers.  There is no tenderness to palpation or swelling.  Distal sensation is intact.  Strength intact.  Fingers are cool to palpation.  Cap refill is decreased in fingers but improved in proximal hands.  Radial pulses are normal bilaterally. No finger skin lesions or signs of necrosis.  Skin:    Findings: No rash.  Neurological:     Mental Status: She is alert.      UC Treatments / Results  Labs (all labs ordered are listed, but only abnormal results are displayed) Labs Reviewed - No data to display  EKG   Radiology No results found.  Procedures Procedures (including critical care time)  Medications Ordered in  UC Medications - No data to display  Initial Impression / Assessment and Plan / UC Course  I have reviewed the triage vital signs and the nursing notes.  Pertinent labs & imaging results that were available during my care of the patient were reviewed by me and considered in my medical decision making (see chart for details).    Patient's symptoms probably represent a new onset Raynaud syndrome ("Secondary Raynaud phenomenon"). Her finger microangiopathy does not appear to be due to occlusive vascular disease because her symptoms are bilateral and symmetric. Diagnostic possibilities include an autoimmune disorder, possible paraneoplastic syndrome (note that patient has a past history of breast cancer), decreased control of her hypothyroid condition (her last TSH done 08/14/22 was normal:  1.01), and new onset acrocyanosis. Review of lab tests done three days ago reveal the following: Normal CBC; CMP normal except hyponatremia 130; HgbA1C normal 5.6; lipid panel cholesterol elevated 210 (stable/improved), elevated LDL 132 (stable). Suggest additional screening tests:  U/A with sediment, ANA, ESR/CMP, repeat TSH. Recommend follow-up with vascular specialist as soon as possible.   Final Clinical Impressions(s) / UC Diagnoses   Final diagnoses:  Acrocyanosis ([place])     Discharge Instructions      Keep hands warm:  wear gloves when outside in cold weather.  If symptoms become significantly worse during the night or over the weekend, proceed to the local emergency room.     ED Prescriptions   None       Kandra Nicolas, MD 12/31/22 1250

## 2022-12-30 NOTE — Discharge Instructions (Signed)
Keep hands warm:  wear gloves when outside in cold weather.  If symptoms become significantly worse during the night or over the weekend, proceed to the local emergency room.

## 2022-12-31 NOTE — Telephone Encounter (Signed)
Pt was seen at [facility] yesterday   Patient Name: [name] [name] Gender: Female DOB: [birth year] Age: 77 Y 9 M 26 D Return Phone Number: [phone] (Primary) Address: City/ State/ Zip: [place]  [address] Client [place] [facility] [place] Client Site [place] [facility] [place] [facility] Contact Type Call Who Is Calling Patient / Member / Family / Caregiver Call Type Triage / Clinical Relationship To Patient Self Return Phone Number [phone] (Primary) Chief Complaint Numbness Reason for Call Symptomatic / Request for Health Information Initial Comment Caller states her thumb and joints on her hands have turned purple and are numb. She has a rod in her neck and thyroid problems so her hands and feet are always cold. Additional Comment Provider: Wellington Hampshire, MD (location confirmed) Translation No Nurse Assessment Nurse: Hassell Done, RN, Melanie Date/Time Eilene Ghazi Time): 12/30/2022 3:42:15 PM Confirm and document reason for call. If symptomatic, describe symptoms. ---Caller states her thumb and joints in her hand have turned purple. Hands are always cold Does the patient have any new or worsening symptoms? ---Yes Will a triage be completed? ---Yes Related visit to physician within the last 2 weeks? ---Yes Does the PT have any chronic conditions? (i.e. diabetes, asthma, this includes High risk factors for pregnancy, etc.) ---Yes List chronic conditions. ---no thyroid poor circulation Is this a behavioral health or substance abuse call? ---No Guidelines Guideline Title Affirmed Question Affirmed Notes Nurse Date/Time (Eastern Time) Finger Pain Patient sounds very sick or weak to the triager Hassell Done, RN, [facility] 12/30/2022 3:46:07 PM Disp. Time Eilene Ghazi Time) Disposition Final User 12/30/2022 3:49:00 PM Go to ED Now (or PCP triage) Yes Hassell Done, RN, Melanie Final Disposition 12/30/2022 3:49:00 PM Go to ED Now (or PCP triage) Yes Hassell Done, RN,  Melanie Caller Disagree/Comply Comply Caller Understands Yes PreDisposition Call Doctor Care Advice Given Per Guideline GO TO ED NOW (OR PCP TRIAGE): ANOTHER ADULT SHOULD DRIVE: * It is better and safer if another adult drives instead of you. BRING MEDICINES: * Please bring a list of your current medicines when you go to see the doctor. CARE ADVICE given per Finger Pain (Adult) guideline. Referrals [facility] [place]

## 2022-12-31 NOTE — Telephone Encounter (Signed)
FYI

## 2023-01-01 ENCOUNTER — Other Ambulatory Visit: Payer: Self-pay

## 2023-01-01 ENCOUNTER — Ambulatory Visit: Payer: Medicare Other | Admitting: Family Medicine

## 2023-01-01 ENCOUNTER — Encounter: Payer: Self-pay | Admitting: Family Medicine

## 2023-01-01 VITALS — BP 130/80 | HR 61 | Temp 97.2°F | Ht 63.0 in | Wt 167.2 lb

## 2023-01-01 DIAGNOSIS — E871 Hypo-osmolality and hyponatremia: Secondary | ICD-10-CM | POA: Diagnosis not present

## 2023-01-01 DIAGNOSIS — R230 Cyanosis: Secondary | ICD-10-CM

## 2023-01-01 DIAGNOSIS — N939 Abnormal uterine and vaginal bleeding, unspecified: Secondary | ICD-10-CM

## 2023-01-01 DIAGNOSIS — N950 Postmenopausal bleeding: Secondary | ICD-10-CM | POA: Diagnosis not present

## 2023-01-01 NOTE — Progress Notes (Signed)
Subjective:     Patient ID: [name] [name], female    DOB: 06/14/1946, 77 y.o.   MRN: [id number]  Chief Complaint  Patient presents with   Follow-up    Follow-up from Urgent Care visit on 12/30/22 for purplish fingers, better Discuss referral to vascular doctor    HPI Seen at [facility] 12/30/22 for cyannosis fingers.  No pain, just blue fingers-all of them.  Reviewed note from uc.  Refer to vascular.  Hasn't had in past.  Was just sitting when happened.  Hands always cold. Can't get hands warm-decades.  Never turned blue till other day.  Drinking more water and trying to keep warm.  Feet always cold as well.  Not blue  Urinates frequently and doesn't drink a lot.  Neck achey-has rods.  Light vag spotting for 6 mo  Health Maintenance Due  Topic Date Due   COVID-19 Vaccine (1) Never done   Pneumonia Vaccine 59+ Years old (1 - PCV) Never done   Medicare Annual Wellness (AWV)  02/27/2023    Past Medical History:  Diagnosis Date   Abnormal Pap smear    Abnormal peripheral vision    RT EYE   Allergy    Anemia    Arthritis    back and neck   Breast cancer ([place]) 1997   right breast   Bruises easily    Cataracts, bilateral    Chronic back pain    Chronic neck pain    Complication of anesthesia    slow to wake up   Family history of adverse reaction to anesthesia    Dad takes a long time to wake up   Frequency of urination    GERD (gastroesophageal reflux disease)    Glaucoma    Goiter    High cholesterol    History of postoperative nausea and vomiting    Hypertension    Hypothyroidism    Neuromuscular disorder (HCC)    Numbness    hands / feet /arms / legs - followed [place] Dr. Georgann Housekeeper [place]   Osteoporosis    PONV (postoperative nausea and vomiting)    Post-menopausal    Prediabetes    Sexual dysfunction    Shortness of breath    with exertion and Wt gain   Urinary incontinence     Past Surgical History:  Procedure Laterality Date   BIOPSY THYROID     BREAST  SURGERY Right    lymph node removed   CATARACT EXTRACTION, BILATERAL  2009,2010   COLONOSCOPY  02/15/2014   Dr.Stark   EYE SURGERY  08/2012   detached retina surg   LAPAROSCOPY  1978   BTL   lumpectomy  1997   Rt   neck fusion  2010   THYROIDECTOMY N/A 05/04/2013   Procedure:  TOTAL THYROIDECTOMY;  Surgeon: Earnstine Regal, MD;  Location: [facility];  Service: General;  Laterality: N/A;   TOTAL HIP ARTHROPLASTY Right 01/02/2021   Procedure: TOTAL HIP ARTHROPLASTY ANTERIOR APPROACH;  Surgeon: Paralee Cancel, MD;  Location: [facility];  Service: Orthopedics;  Laterality: Right;  70 mins   TUBAL LIGATION      Outpatient Medications Prior to Visit  Medication Sig Dispense Refill   aspirin 81 MG chewable tablet 162 mg 2 (two) times a week.     Biotin w/ Vitamins C & E (HAIR/SKIN/NAILS PO) Take by mouth.     denosumab (PROLIA) 60 MG/ML SOSY injection Inject 60 mg into the skin every 6 (six) months.  dorzolamide-timolol (COSOPT) 22.3-6.8 MG/ML ophthalmic solution Place 1 drop into both eyes 2 (two) times daily.      gabapentin (NEURONTIN) 300 MG capsule Take 1 capsule (300 mg total) by mouth 3 (three) times daily. 270 capsule 3   irbesartan (AVAPRO) 300 MG tablet TAKE 1 TABLET BY MOUTH DAILY 90 tablet 3   latanoprost (XALATAN) 0.005 % ophthalmic solution Place 1 drop into the left eye at bedtime.     levothyroxine (SYNTHROID) 88 MCG tablet TAKE 1 TABLET BY MOUTH DAILY (Patient taking differently: Take 88 mcg by mouth daily.) 90 tablet 3   Multiple Vitamin (MULTIVITAMIN ADULT) TABS Take 1 tablet by mouth daily.     polyvinyl alcohol (LIQUIFILM TEARS) 1.4 % ophthalmic solution Place 1 drop into both eyes daily as needed (for dry eyes).     rosuvastatin (CRESTOR) 5 MG tablet TAKE 1 TABLET BY MOUTH TWICE  WEEKLY 26 tablet 3   No facility-administered medications prior to visit.    Allergies  Allergen Reactions   Erythromycin Nausea Only    Other reaction(s): Unknown   Ezetimibe     Other  reaction(s): myalgias   Latex Other (See Comments)   Other Itching    Unknown Dye PET scan dye possibly= itching   Statins     Other reaction(s): myalgias   ROS neg/noncontributory except as noted HPI/below      Objective:     BP 130/80   Pulse 61   Temp (!) 97.2 F (36.2 C) (Temporal)   Ht '5\' 3"'$  (1.6 m)   Wt 167 lb 3 oz (75.8 kg)   SpO2 94%   BMI 29.62 kg/m  Wt Readings from Last 3 Encounters:  01/01/23 167 lb 3 oz (75.8 kg)  12/25/22 167 lb 3.2 oz (75.8 kg)  11/12/22 167 lb 9.6 oz (76 kg)    Physical Exam   Gen: WDWN NAD HEENT: NCAT, conjunctiva not injected, sclera nonicteric CARDIAC: RRR, S1S2+, 1-2/6 murmur.  EXT: radial pulses 2+-fingers very red and warm right now. Cap refill <3s.  MSK: no gross abnormalities.  NEURO: A&O x3.  CN II-XII intact.  PSYCH: normal mood. Good eye contact     Assessment & Plan:   Problem List Items Addressed This Visit   None Visit Diagnoses     Cyanosis of fingertip    -  Primary   Relevant Orders   Urinalysis, Routine w reflex microscopic   ANA   Sedimentation rate   C-reactive protein   TSH   Ambulatory referral to Vascular Surgery   Vaginal spotting       Relevant Orders   Ambulatory referral to Obstetrics / Gynecology   Post-menopausal bleeding       Relevant Orders   Ambulatory referral to Obstetrics / Gynecology      Cyanosis of fingertips-poss raynaud's, other.  Pt wants to hold on amlodipine.  Check ua,tsh, esr, ana.  Keep hands warm.  Refer vasc Vag spotting/post men bleeding-to gyn Hyponatremia-pt will get labs today rather than 1 mo  No orders of the defined types were placed in this encounter.   Wellington Hampshire, MD

## 2023-01-01 NOTE — Addendum Note (Signed)
Addended by: Loura Back on: 01/01/2023 03:09 PM   Modules accepted: Orders

## 2023-01-01 NOTE — Addendum Note (Signed)
Addended by: Loura Back on: 01/01/2023 03:11 PM   Modules accepted: Orders

## 2023-01-02 LAB — URINALYSIS, ROUTINE W REFLEX MICROSCOPIC
Bilirubin Urine: NEGATIVE
Hgb urine dipstick: NEGATIVE
Ketones, ur: NEGATIVE
Nitrite: NEGATIVE
Specific Gravity, Urine: 1.01 (ref 1.000–1.030)
Total Protein, Urine: NEGATIVE
Urine Glucose: NEGATIVE
Urobilinogen, UA: 0.2 (ref 0.0–1.0)
pH: 7 (ref 5.0–8.0)

## 2023-01-02 LAB — BASIC METABOLIC PANEL
BUN: 15 mg/dL (ref 6–23)
CO2: 23 mEq/L (ref 19–32)
Calcium: 9.2 mg/dL (ref 8.4–10.5)
Chloride: 99 mEq/L (ref 96–112)
Creatinine, Ser: 0.83 mg/dL (ref 0.40–1.20)
GFR: 68.76 mL/min (ref >60.00)
Glucose, Bld: 82 mg/dL (ref 70–99)
Potassium: 4.2 mEq/L (ref 3.5–5.1)
Sodium: 136 mEq/L (ref 135–145)

## 2023-01-02 LAB — SEDIMENTATION RATE: Sed Rate: 21 mm/hr (ref 0–30)

## 2023-01-02 LAB — TSH: TSH: 1.4 u[IU]/mL (ref 0.35–5.50)

## 2023-01-02 LAB — C-REACTIVE PROTEIN: CRP: <1 mg/dL (ref 0.5–20.0)

## 2023-01-02 NOTE — Progress Notes (Signed)
Labs better.  Some still pending

## 2023-01-02 NOTE — Addendum Note (Signed)
Addended by: Loura Back on: 01/02/2023 01:55 PM   Modules accepted: Orders

## 2023-01-03 ENCOUNTER — Encounter: Payer: Self-pay | Admitting: Dietician

## 2023-01-03 ENCOUNTER — Telehealth: Payer: Self-pay | Admitting: Family Medicine

## 2023-01-03 ENCOUNTER — Encounter (HOSPITAL_BASED_OUTPATIENT_CLINIC_OR_DEPARTMENT_OTHER): Payer: Medicare Other | Admitting: Dietician

## 2023-01-03 DIAGNOSIS — R7303 Prediabetes: Secondary | ICD-10-CM

## 2023-01-03 LAB — ANA: Anti Nuclear Antibody (ANA): POSITIVE — AB

## 2023-01-03 LAB — ANTI-NUCLEAR AB-TITER (ANA TITER): ANA Titer 1: 1:40 {titer} — ABNORMAL HIGH

## 2023-01-03 NOTE — Telephone Encounter (Signed)
Returned call to patient, gave lab results. Patient verbalized understanding.

## 2023-01-03 NOTE — Telephone Encounter (Signed)
Patient states she was returning [name]'s call in regards to lab results. Requests a callback when able.

## 2023-01-03 NOTE — Progress Notes (Signed)
Patient was seen on 01/03/2023 for the Core Session 4 of Diabetes Prevention Program course at [facility]. By the end of this session patients are able to complete the following objectives:   Learning Objectives: Explain the health benefits of eating less fat and fewer calories. Describe the MyPlate food guide and its recommendations, including how to reduce fat and calories in our diet. Compare and contrast MyPlate guidelines with participants' eating habits. List ways to replace high-fat and high-calorie foods with low-fat and low-calorie foods. Explain the importance of eating plenty of whole grains, vegetables, and fruits, while staying within fat gram goals. Explain the importance of eating foods from all groups of MyPlate and of eating a variety of foods from within each group. Explain why a balanced diet is beneficial to health. Explain why eating the same foods over and over is not the best strategy for long-term success.   Goals:  Record weight taken outside of class.  Track foods and beverages eaten each day in the "Food and Activity Tracker," including calories and fat grams for each item.  Practice comparing what you eat with the recommendations of MyPlate using the "Rate Your Plate" handout.  Complete the "Rate Your Plate" handout form on at least 3 days.  Answer homework questions.   Follow-Up Plan: Attend Core Session 5 next week.  Bring completed "Food and Activity Tracker" next week to be reviewed by Lifestyle Coach.

## 2023-01-08 LAB — EXTRA URINE SPECIMEN

## 2023-01-08 LAB — OSMOLALITY, URINE: Osmolality, Ur: 262 mOsm/kg (ref 50–1200)

## 2023-01-08 LAB — OSMOLALITY

## 2023-01-08 LAB — SODIUM, URINE, RANDOM: Sodium, Ur: 45 mmol/L (ref 28–272)

## 2023-01-09 ENCOUNTER — Other Ambulatory Visit: Payer: Self-pay | Admitting: *Deleted

## 2023-01-09 DIAGNOSIS — R230 Cyanosis: Secondary | ICD-10-CM

## 2023-01-13 ENCOUNTER — Encounter (HOSPITAL_BASED_OUTPATIENT_CLINIC_OR_DEPARTMENT_OTHER): Payer: Medicare Other | Admitting: Dietician

## 2023-01-13 ENCOUNTER — Encounter: Payer: Self-pay | Admitting: Dietician

## 2023-01-13 DIAGNOSIS — R7303 Prediabetes: Secondary | ICD-10-CM

## 2023-01-13 NOTE — Progress Notes (Signed)
Patient was seen on 01/13/2023 for the Core Session 5 of Diabetes Prevention Program course at [facility]. By the end of this session patients are able to complete the following objectives:   Learning Objectives: Establish a physical activity goal. Explain the importance of the physical activity goal. Describe their current level of physical activity. Name ways that they are already physically active. Develop personal plans for physical activity for the next week.   Goals:  Record weight taken outside of class.  Track foods and beverages eaten each day in the "Food and Activity Tracker," including calories and fat grams for each item.  Make an Activity Plan including date, specific type of activity, and length of time you plan to be active that includes at last 60 minutes of activity for the week.  Track activity type, minutes you were active, and distance you reached each day in the "Food and Activity Tracker."   Follow-Up Plan: Attend Core Session 6 next week.  Bring completed "Food and Activity Tracker" next week to be reviewed by Lifestyle Coach.

## 2023-01-14 ENCOUNTER — Encounter: Payer: Self-pay | Admitting: Dietician

## 2023-01-14 ENCOUNTER — Encounter (HOSPITAL_BASED_OUTPATIENT_CLINIC_OR_DEPARTMENT_OTHER): Payer: Medicare Other | Admitting: Dietician

## 2023-01-14 DIAGNOSIS — R7303 Prediabetes: Secondary | ICD-10-CM | POA: Diagnosis not present

## 2023-01-14 NOTE — Progress Notes (Signed)
Patient was seen on 01/14/2023 for the Core Session 6 of Diabetes Prevention Program course at [facility]. By the end of this session patients are able to complete the following objectives:   Learning Objectives: Graph their daily physical activity.  Describe two ways of finding the time to be active.  Define "lifestyle activity."  Describe how to prevent injury.  Develop an activity plan for the coming week.   Goals:  Record weight taken outside of class.  Track foods and beverages eaten each day in the "Food and Activity Tracker," including calories and fat grams for each item.   Track activity type, minutes you were active, and distance you reached each day in the "Food and Activity Tracker."  Set aside one 20 to 30-minute block of time every day or find two or more periods of 10 to15 minutes each for physical activity.  Warm up, cool down, and stretch. Make a Physical Activities Plan for the Week.   Follow-Up Plan: Attend Core Session 7 next week.  Bring completed "Food and Activity Tracker" next week to be reviewed by Lifestyle Coach.

## 2023-01-27 ENCOUNTER — Ambulatory Visit: Payer: Medicare Other | Admitting: Surgery

## 2023-01-27 ENCOUNTER — Encounter: Payer: Self-pay | Admitting: Surgery

## 2023-01-27 ENCOUNTER — Ambulatory Visit (HOSPITAL_COMMUNITY)
Admission: RE | Admit: 2023-01-27 | Discharge: 2023-01-27 | Disposition: A | Discharge status: Home / Self Care | Payer: Medicare Other | Source: Ambulatory Visit | Attending: Surgery | Admitting: Surgery

## 2023-01-27 VITALS — BP 147/82 | HR 45 | Temp 97.9°F | Resp 20 | Ht 63.0 in | Wt 164.0 lb

## 2023-01-27 DIAGNOSIS — I7300 Raynaud's syndrome without gangrene: Secondary | ICD-10-CM

## 2023-01-27 DIAGNOSIS — R230 Cyanosis: Secondary | ICD-10-CM | POA: Diagnosis present

## 2023-01-27 MED ORDER — AMLODIPINE BESYLATE 5 MG PO TABS: 5.0000 mg | ORAL_TABLET | Freq: Every day | ORAL | 11 refills | Status: DC

## 2023-01-27 NOTE — Progress Notes (Signed)
[facility] [place]  Patient name: [name] [name] MRN: [id number] DOB: 10/06/1946 Sex: female   REQUESTING PROVIDER:    Dr. Cherlynn Kaiser   REASON FOR CONSULT:    Discolored finger  HISTORY OF PRESENT ILLNESS:   [name] [name] is a 77 y.o. female, who is referred for evaluation of bluish discoloration in her fingers.  This happened for the very first time a few weeks ago.  The right hand was worse than the left.  She has always had issues with cold hands and cold feet.  This was also present in her father.  She does report a history of neck surgery in 2010.  This was done for numbness and clumsiness, which she attributes to her job when she worked for [employer].  She does not have any other systemic symptoms.  She is medically managed for hypertension.  She takes a statin for hypercholesterolemia.  She is a former smoker.  PAST MEDICAL HISTORY    Past Medical History:  Diagnosis Date   Abnormal Pap smear    Abnormal peripheral vision    RT EYE   Allergy    Anemia    Arthritis    back and neck   Breast cancer ([place]) 1997   right breast   Bruises easily    Cataracts, bilateral    Chronic back pain    Chronic neck pain    Complication of anesthesia    slow to wake up   Family history of adverse reaction to anesthesia    Dad takes a long time to wake up   Frequency of urination    GERD (gastroesophageal reflux disease)    Glaucoma    Goiter    High cholesterol    History of postoperative nausea and vomiting    Hypertension    Hypothyroidism    Neuromuscular disorder (HCC)    Numbness    hands / feet /arms / legs - followed by Dr. Georgann Housekeeper - [place]   Osteoporosis    PONV (postoperative nausea and vomiting)    Post-menopausal    Prediabetes    Sexual dysfunction    Shortness of breath    with exertion and Wt gain   Urinary incontinence      FAMILY HISTORY   Family History  Problem Relation Age of Onset    Hyperlipidemia Mother    Heart disease Mother    Diabetes Mother    Hypertension Mother    Hyperlipidemia Father    Colon cancer Father 30   Hearing loss Sister    Hyperlipidemia Sister    Heart disease Sister    Heart disease Sister    Hyperlipidemia Sister    Stroke Sister    Hypertension Sister    Thyroid disease Sister    Hyperlipidemia Brother    Drug abuse Brother    Early death Brother    Esophageal cancer Brother 63       smoker   Diabetes Brother    Heart disease Brother    Hypertension Brother    Heart attack Brother    Early death Brother    Depression Daughter    Cervical cancer Daughter        dx in her 41s   Colon cancer Paternal Aunt 89   Heart disease Maternal Grandmother    Early death Maternal Grandmother    Diabetes Maternal Grandmother    Asthma Maternal Grandmother    Diabetes Paternal Grandmother    Hypertension  Paternal Grandmother    Heart disease Paternal Grandmother    Breast cancer Paternal Grandmother        dx in her 72s   Heart disease Paternal Grandfather    Thyroid disease Other 78       niece    SOCIAL HISTORY:   Social History   Socioeconomic History   Marital status: Single    Spouse name: Not on file   Number of children: 3   Years of education: 14   Highest education level: Not on file  Occupational History   Occupation: retired  Tobacco Use   Smoking status: Former    Packs/day: 0.50    Years: 3.00    Total pack years: 1.50    Types: Cigarettes    Start date: 01/24/1966    Quit date: 04/27/1968    Years since quitting: 54.7   Smokeless tobacco: Never   Tobacco comments:    quit over 40 years ago  Vaping Use   Vaping Use: Never used  Substance and Sexual Activity   Alcohol use: Yes    Comment: 1 every other month   Drug use: No   Sexual activity: Not Currently  Other Topics Concern   Not on file  Social History Narrative   Patient is single and lives alone.Patient has 3 children and 1 step daughter. 5 grands,  +3 steps,  6 great grands(step). 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   Social Determinants of Health   Financial Resource Strain: Low Risk  (02/26/2022)   Overall Financial Resource Strain (CARDIA)    Difficulty of Paying Living Expenses: Not hard at all  Food Insecurity: No Food Insecurity (02/26/2022)   Hunger Vital Sign    Worried About Running Out of Food in the Last Year: Never true    Ran Out of Food in the Last Year: Never true  Transportation Needs: No Transportation Needs (02/26/2022)   PRAPARE - Hydrologist (Medical): No    Lack of Transportation (Non-Medical): No  Physical Activity: Sufficiently Active (02/26/2022)   Exercise Vital Sign    Days of Exercise per Week: 3 days    Minutes of Exercise per Session: 60 min  Stress: No Stress Concern Present (02/26/2022)   [place]    Feeling of Stress : Not at all  Social Connections: Moderately Integrated (02/26/2022)   Social Connection and Isolation Panel [NHANES]    Frequency of Communication with Friends and Family: More than three times a week    Frequency of Social Gatherings with Friends and Family: More than three times a week    Attends Religious Services: 1 to 4 times per year    Active Member of [employer] or Organizations: Yes    Attends Archivist Meetings: 1 to 4 times per year    Marital Status: Never married  Intimate Partner Violence: Not At Risk (02/26/2022)   Humiliation, Afraid, Rape, and Kick questionnaire    Fear of Current or Ex-Partner: No    Emotionally Abused: No    Physically Abused: No    Sexually Abused: No    ALLERGIES:    Allergies  Allergen Reactions   Erythromycin Nausea Only    Other reaction(s): Unknown   Ezetimibe     Other reaction(s): myalgias   Latex Other (See Comments)    Other Itching  Unknown Dye PET scan dye possibly= itching   Statins     Other reaction(s): myalgias    CURRENT MEDICATIONS:    Current Outpatient Medications  Medication Sig Dispense Refill   aspirin 81 MG chewable tablet 162 mg 2 (two) times a week.     Biotin w/ Vitamins C & E (HAIR/SKIN/NAILS PO) Take by mouth.     denosumab (PROLIA) 60 MG/ML SOSY injection Inject 60 mg into the skin every 6 (six) months.     dorzolamide-timolol (COSOPT) 22.3-6.8 MG/ML ophthalmic solution Place 1 drop into both eyes 2 (two) times daily.      gabapentin (NEURONTIN) 300 MG capsule Take 1 capsule (300 mg total) by mouth 3 (three) times daily. 270 capsule 3   irbesartan (AVAPRO) 300 MG tablet TAKE 1 TABLET BY MOUTH DAILY 90 tablet 3   latanoprost (XALATAN) 0.005 % ophthalmic solution Place 1 drop into the left eye at bedtime.     levothyroxine (SYNTHROID) 88 MCG tablet TAKE 1 TABLET BY MOUTH DAILY (Patient taking differently: Take 88 mcg by mouth daily.) 90 tablet 3   Multiple Vitamin (MULTIVITAMIN ADULT) TABS Take 1 tablet by mouth daily.     polyvinyl alcohol (LIQUIFILM TEARS) 1.4 % ophthalmic solution Place 1 drop into both eyes daily as needed (for dry eyes).     rosuvastatin (CRESTOR) 5 MG tablet TAKE 1 TABLET BY MOUTH TWICE  WEEKLY 26 tablet 3   No current facility-administered medications for this visit.    REVIEW OF SYSTEMS:   '[X]'$  denotes positive finding, '[ ]'$  denotes negative finding Cardiac  Comments:  Chest pain or chest pressure:    Shortness of breath upon exertion:    Short of breath when lying flat:    Irregular heart rhythm:        Vascular    Pain in calf, thigh, or hip brought on by ambulation:    Pain in feet at night that wakes you up from your sleep:     Blood clot in your veins:    Leg swelling:         Pulmonary    Oxygen at home:    Productive cough:     Wheezing:         Neurologic    Sudden weakness in arms or legs:     Sudden numbness in arms or legs:      Sudden onset of difficulty speaking or slurred speech:    Temporary loss of vision in one eye:     Problems with dizziness:         Gastrointestinal    Blood in stool:      Vomited blood:         Genitourinary    Burning when urinating:     Blood in urine:        Psychiatric    Major depression:         Hematologic    Bleeding problems:    Problems with blood clotting too easily:        Skin    Rashes or ulcers:        Constitutional    Fever or chills:     PHYSICAL EXAM:   Vitals:   01/27/23 1234  BP: (!) 147/82  Pulse: (!) 45  Resp: 20  Temp: 97.9 F (36.6 C)  SpO2: 97%  Weight: 164 lb (74.4 kg)  Height: '5\' 3"'$  (1.6 m)    GENERAL: The patient is a well-nourished female, in no  acute distress. The vital signs are documented above. CARDIAC: There is a regular rate and rhythm.  VASCULAR: Easily palpable bilateral radial arteries.  She has triphasic ulnar and radial artery Doppler signals PULMONARY: Nonlabored respirations MUSCULOSKELETAL: There are no major deformities or cyanosis. NEUROLOGIC: No focal weakness or paresthesias are detected. SKIN: There are no ulcers or rashes noted. PSYCHIATRIC: The patient has a normal affect.  STUDIES:   I have reviewed the following:  Right: PPG tracings from the 1st, 2nd, 3rd, 4th and 5th finger demonstrate  diminished amplitude ar rest.  Left: PPG tracings from the 1st, 2nd, 3rd and 5th finger demonstrate  diminished amplitude at rest,.   Patients hads were warmed with a blanket for 5 minutes.   PPF bilateral waveforms post warming improved significantly, not returning  to the resting baseline..   ASSESSMENT and PLAN   Nonobstructive Raynaud's: This potentially could be related to her prior neck surgery and degeneration over time of her nerves.  This should probably be reevaluated since her surgery was almost 15 years ago.  As far as treating her current symptoms, I have started her on amlodipine 5 mg.  I have  encouraged her to avoid environmental cold temperature exposures and to passively try to keep her extremities is warm as possible   Annamarie Major, IV, MD, FACS [facility] [phone] Pager [phone]

## 2023-01-29 ENCOUNTER — Emergency Department (HOSPITAL_COMMUNITY)
Admission: EM | Admit: 2023-01-29 | Discharge: 2023-01-29 | Disposition: A | Discharge status: Home / Self Care | Payer: Medicare Other | Attending: Emergency Medicine | Admitting: Emergency Medicine

## 2023-01-29 ENCOUNTER — Emergency Department (HOSPITAL_COMMUNITY): Payer: Medicare Other

## 2023-01-29 DIAGNOSIS — F17200 Nicotine dependence, unspecified, uncomplicated: Secondary | ICD-10-CM | POA: Insufficient documentation

## 2023-01-29 DIAGNOSIS — I10 Essential (primary) hypertension: Secondary | ICD-10-CM | POA: Insufficient documentation

## 2023-01-29 DIAGNOSIS — Z79899 Other long term (current) drug therapy: Secondary | ICD-10-CM | POA: Insufficient documentation

## 2023-01-29 DIAGNOSIS — R079 Chest pain, unspecified: Secondary | ICD-10-CM | POA: Diagnosis present

## 2023-01-29 DIAGNOSIS — Z91040 Latex allergy status: Secondary | ICD-10-CM | POA: Insufficient documentation

## 2023-01-29 LAB — BASIC METABOLIC PANEL
Anion gap: 7 (ref 5–15)
BUN: 9 mg/dL (ref 8–23)
CO2: 23 mmol/L (ref 22–32)
Calcium: 8.2 mg/dL — ABNORMAL LOW (ref 8.9–10.3)
Chloride: 105 mmol/L (ref 98–111)
Creatinine, Ser: 0.76 mg/dL (ref 0.44–1.00)
GFR, Estimated: >60 mL/min (ref >60)
Glucose, Bld: 81 mg/dL (ref 70–99)
Potassium: 3.6 mmol/L (ref 3.5–5.1)
Sodium: 135 mmol/L (ref 135–145)

## 2023-01-29 LAB — CBC
HCT: 36 % (ref 36.0–46.0)
Hemoglobin: 12.1 g/dL (ref 12.0–15.0)
MCH: 31.5 pg (ref 26.0–34.0)
MCHC: 33.6 g/dL (ref 30.0–36.0)
MCV: 93.8 fL (ref 80.0–100.0)
Platelets: 220 10*3/uL (ref 150–400)
RBC: 3.84 MIL/uL — ABNORMAL LOW (ref 3.87–5.11)
RDW: 12.5 % (ref 11.5–15.5)
WBC: 6.7 10*3/uL (ref 4.0–10.5)
nRBC: 0 % (ref 0.0–0.2)

## 2023-01-29 LAB — TROPONIN I (HIGH SENSITIVITY)
Troponin I (High Sensitivity): 8 ng/L (ref <18)
Troponin I (High Sensitivity): 12 ng/L (ref <18)

## 2023-01-29 NOTE — ED Triage Notes (Signed)
Pt BIB EMS from home after checking her blood pressure at home because of chest pain, pressure was 0000000 systolic after checking several times. PCP told her to come into hospital for abnormally high blood pressure. Takes BP meds, says its in chart. Took 2 325 ASA and chest pain resolved. Describes chest pain as a discomfort feeling. Aox4.

## 2023-01-29 NOTE — Discharge Instructions (Addendum)
You were seen for your chest pain in the emergency department.   At home, please take Tylenol as needed for any chest discomfort that you have.    Follow-up with your primary doctor in 2-3 days regarding your visit.  Cardiology will be calling you regarding an appointment within the next 72 hours.  Please contact your cardiologist if you do not hear from them in that timeframe.  Return immediately to the emergency department if you experience any of the following: Worsening pain, difficulty breathing, unexplained vomiting or sweating, or any other concerning symptoms.    Thank you for visiting our Emergency Department. It was a pleasure taking care of you today.

## 2023-01-29 NOTE — ED Provider Notes (Signed)
[facility] Provider Note   CSN: [id number] Arrival date & time: 01/29/23  [id number]     History  Chief Complaint  Patient presents with   Hypertension   Chest Pain    [name] [name] is a 77 y.o. female.  77 year old female with a history of hypertension, hyperlipidemia, prior tobacco use, and family history of MI presents emergency department chest pain.  At 7 AM this morning the patient reports that she experienced severe left-sided chest pain while she was drinking coffee.  Says that it was not sharp.  Did not radiate.  Has waxed and waned and typically will last for several minutes at a time.  Not exertional.  No diaphoresis or vomiting.  No personal history of MI but brother, sisters, and mother have all had heart attacks or stents placed.  Brother was the youngest in his 82s with an MI.  Quit smoking over 40 years ago.   Did take 2 doses of 325 of oral aspirin prior to arrival which she reports improved her pain.  Denies any chest pain, cough, or recent illnesses.  Took her blood pressure at home which was 192/80 which is spontaneously improved.       Home Medications Prior to Admission medications   Medication Sig Start Date End Date Taking? Authorizing Provider  amLODipine (NORVASC) 5 MG tablet Take 1 tablet (5 mg total) by mouth daily. 01/27/23   Serafina Mitchell, MD  Biotin w/ Vitamins C & E (HAIR/SKIN/NAILS PO) Take by mouth.    [provider]  denosumab (PROLIA) 60 MG/ML SOSY injection Inject 60 mg into the skin every 6 (six) months.    [provider]  dorzolamide-timolol (COSOPT) 22.3-6.8 MG/ML ophthalmic solution Place 1 drop into both eyes 2 (two) times daily.     [provider]  gabapentin (NEURONTIN) 300 MG capsule Take 1 capsule (300 mg total) by mouth 3 (three) times daily. 12/25/22   Tawnya Crook, MD  irbesartan (AVAPRO) 300 MG tablet TAKE 1 TABLET BY MOUTH DAILY 12/16/22   Tawnya Crook,  MD  latanoprost (XALATAN) 0.005 % ophthalmic solution Place 1 drop into the left eye at bedtime. 05/04/20   [provider]  levothyroxine (SYNTHROID) 88 MCG tablet TAKE 1 TABLET BY MOUTH DAILY Patient taking differently: Take 88 mcg by mouth daily. 12/23/22   Tawnya Crook, MD  Multiple Vitamin (MULTIVITAMIN ADULT) TABS Take 1 tablet by mouth daily.    [provider]  polyvinyl alcohol (LIQUIFILM TEARS) 1.4 % ophthalmic solution Place 1 drop into both eyes daily as needed (for dry eyes).    [provider]  rosuvastatin (CRESTOR) 5 MG tablet TAKE 1 TABLET BY MOUTH TWICE  WEEKLY 10/06/22   Tawnya Crook, MD  VITAMIN D PO Take by mouth.    [provider]      Allergies    Erythromycin, Ezetimibe, Latex, Other, and Statins    Review of Systems   Review of Systems  Physical Exam Updated Vital Signs BP (!) 158/70   Pulse 71   Temp 98.1 F (36.7 C) (Oral)   Resp 19   Ht '5\' 3"'$  (1.6 m)   Wt 74.8 kg   SpO2 98%   BMI 29.23 kg/m  Physical Exam Vitals and nursing note reviewed.  Constitutional:      General: She is not in acute distress.    Appearance: She is well-developed.  HENT:     Head:  Normocephalic and atraumatic.     Right Ear: External ear normal.     Left Ear: External ear normal.     Nose: Nose normal.  Eyes:     Extraocular Movements: Extraocular movements intact.     Conjunctiva/sclera: Conjunctivae normal.     Pupils: Pupils are equal, round, and reactive to light.  Cardiovascular:     Rate and Rhythm: Normal rate and regular rhythm.     Heart sounds: No murmur heard.    Comments: Radial pulses 2+ bilaterally Pulmonary:     Effort: Pulmonary effort is normal. No respiratory distress.     Breath sounds: Normal breath sounds.  Abdominal:     General: Abdomen is flat.  Musculoskeletal:     Cervical back: Normal range of motion and neck supple.     Right lower leg: No edema.     Left lower leg: No edema.  Skin:     General: Skin is warm and dry.  Neurological:     Mental Status: She is alert and oriented to person, place, and time. Mental status is at baseline.  Psychiatric:        Mood and Affect: Mood normal.     ED Results / Procedures / Treatments   Labs (all labs ordered are listed, but only abnormal results are displayed) Labs Reviewed  BASIC METABOLIC PANEL - Abnormal; Notable for the following components:      Result Value   Calcium 8.2 (*)    All other components within normal limits  CBC - Abnormal; Notable for the following components:   RBC 3.84 (*)    All other components within normal limits  TROPONIN I (HIGH SENSITIVITY)  TROPONIN I (HIGH SENSITIVITY)    EKG EKG Interpretation  Date/Time:  Wednesday January 29 2023 09:20:20 EST Ventricular Rate:  48 PR Interval:    QRS Duration: 102 QT Interval:  433 QTC Calculation: 387 R Axis:   34 Text Interpretation: Sinus bradycardia Low voltage, precordial leads Probable anteroseptal infarct, old Confirmed by Margaretmary Eddy [phone]) on 01/29/2023 9:24:31 AM  Radiology No results found.  Procedures Procedures   Medications Ordered in ED Medications - No data to display  ED Course/ Medical Decision Making/ A&P                             Medical Decision Making Amount and/or Complexity of Data Reviewed Labs: ordered. Radiology: ordered.   [name] [name] is a 77 y.o. female with comorbidities that complicate the patient evaluation including  hypertension, hyperlipidemia, prior tobacco use, and family history of MI presents emergency department chest pain.   Initial Ddx:  MI, PE, GERD/heartburn, pleurisy  MDM:  With patient's family history and risk factors concern about possible MI however does not have description that is typical of angina.  No respiratory symptoms to suggest PE or pleurisy.  Since it occurred whilst eating may be related to GERD if workup today is reassuring.  Plan:  Labs Troponin EKG Chest  x-ray  ED Summary/Re-evaluation:  Patient underwent the above workup which was reassuring.  EKG and serial troponins without concerning findings.  Did discuss the patient's chest pain and admission versus outpatient workup and she preferred to follow-up with cardiology as an outpatient.  Ambulatory referral placed to cardiology.  Also instructed her to follow-up with her primary doctor in several days.  This patient presents to the ED for concern of complaints listed in HPI, this  involves an extensive number of treatment options, and is a complaint that carries with it a high risk of complications and morbidity. Disposition including potential need for admission considered.   Dispo: DC Home. Return precautions discussed including, but not limited to, those listed in the AVS. Allowed pt time to ask questions which were answered fully prior to dc.  Additional history obtained from daughter Records reviewed Outpatient Clinic Notes The following labs were independently interpreted: Serial Troponins and show no acute abnormality I independently reviewed the following imaging with scope of interpretation limited to determining acute life threatening conditions related to emergency care: Chest x-ray and agree with the radiologist interpretation with the following exceptions: None I personally reviewed and interpreted cardiac monitoring: sinus bradycardia I personally reviewed and interpreted the pt's EKG: see above for interpretation  I have reviewed the patients home medications and made adjustments as needed Social Determinants of health:  Elderly  Final Clinical Impression(s) / ED Diagnoses Final diagnoses:  Chest pain, unspecified type    Rx / DC Orders ED Discharge Orders          Ordered    Ambulatory referral to Cardiology        01/29/23 1515              Fransico Meadow, MD 02/02/23 1116

## 2023-01-30 ENCOUNTER — Encounter: Payer: Self-pay | Admitting: Family Medicine

## 2023-01-30 ENCOUNTER — Telehealth: Payer: Self-pay | Admitting: Family Medicine

## 2023-01-30 ENCOUNTER — Telehealth: Payer: Self-pay

## 2023-01-30 ENCOUNTER — Ambulatory Visit (INDEPENDENT_AMBULATORY_CARE_PROVIDER_SITE_OTHER): Payer: Medicare Other | Admitting: Family Medicine

## 2023-01-30 VITALS — BP 120/70 | HR 58 | Temp 97.5°F | Ht 63.0 in | Wt 165.4 lb

## 2023-01-30 DIAGNOSIS — R0789 Other chest pain: Secondary | ICD-10-CM

## 2023-01-30 NOTE — Telephone Encounter (Signed)
  Pt was seen in [facility] on 01/29/23  Patient Name: [name] [name] Gender: Female DOB: December 18, 1945 Age: 77 Y 10 M 25 D Return Phone Number: [phone] (Primary), [phone] (Secondary) Address: City/ State/ Zip: [place]  [address] Client [facility] Client Site [facility] Night Provider Esther Hardy Contact Type Call Who Is Calling Patient / Member / Family / Caregiver Call Type Triage / Clinical Relationship To Patient Self Return Phone Number Please choose phone number Chief Complaint CHEST PAIN - pain, pressure, heaviness or tightness Reason for Call Symptomatic / Request for [place] states she has chest pain and high blood pressure. Last reading was 192/89. Translation No Nurse Assessment Nurse: Kirk Ruths, RN, [facility] Date/Time (Eastern Time): 01/29/2023 7:59:44 AM Confirm and document reason for call. If symptomatic, describe symptoms. ---Caller states she has chest pain and her most recent b/p this morning is 192/89 no shortness of breath no headache no palpitations the chest pain has seemed to resolve the chest pain was on the left side Does the patient have any new or worsening symptoms? ---Yes Will a triage be completed? ---Yes Related visit to physician within the last 2 weeks? ---No Does the PT have any chronic conditions? (i.e. diabetes, asthma, this includes High risk factors for pregnancy, etc.) ---Yes List chronic conditions. ---htn pain cholesterol Is this a behavioral health or substance abuse call? ---No Guidelines Guideline Title Affirmed Question Affirmed Notes Nurse Date/Time (Eastern Time) Blood Pressure - High [1] Weakness of the face, arm or leg on one side of the body AND [2] new-onset Kirk Ruths, RN, [facility] 01/29/2023 8:02:44 AM Disp. Time Eilene Ghazi Time) Disposition Final User 01/29/2023 7:56:51 AM Send to Urgent Jeannette Corpus, Tiffany 01/29/2023  8:04:19 AM Call EMS 911 Now Yes Kirk Ruths, RN, [facility] Final Disposition 01/29/2023 8:04:19 AM Call EMS 911 Now Yes Kirk Ruths, RN, [facility] Caller Disagree/Comply Comply Caller Understands Yes PreDisposition Call Doctor Care Advice Given Per Guideline CALL EMS 911 NOW: * Immediate medical attention is needed. You need to hang up and call 911 (or an ambulance). CARE ADVICE given per High Blood Pressure (Adult) guideline.

## 2023-01-30 NOTE — Transitions of Care (Post Inpatient/ED Visit) (Signed)
   01/30/2023  Name: [name] [name] MRN: [id number] DOB: 1945/12/04  Today's TOC FU Call Status:    Transition Care Management Follow-up Telephone Call Date of Discharge: 01/29/23 Discharge Facility: [facility]) Type of Discharge: Emergency Department Reason for ED Visit: Cardiac Conditions Cardiac Conditions Diagnosis: Chest Pain Persisting How have you been since you were released from the hospital?: Same Any questions or concerns?: Yes Patient Questions/Concerns:: Blood pressure stabalized at ER, but elevated again this morning. Patient Questions/Concerns Addressed: Notified Provider of Patient Questions/Concerns  Items Reviewed: Did you receive and understand the discharge instructions provided?: Yes Medications obtained and verified?: Yes (Medications Reviewed) Any new allergies since your discharge?: No Dietary orders reviewed?: NA Do you have support at home?: Yes  Home Care and Equipment/Supplies: [place] Ordered?: No Any new equipment or medical supplies ordered?: No  Functional Questionnaire: Do you need assistance with bathing/showering or dressing?: Yes Do you need assistance with meal preparation?: Yes Do you need assistance with eating?: Yes Do you have difficulty maintaining continence: Yes Do you need assistance with getting out of bed/getting out of a chair/moving?: Yes Do you have difficulty managing or taking your medications?: Yes  Folllow up appointments reviewed: PCP Follow-up appointment confirmed?: Yes Date of PCP follow-up appointment?: 01/30/23 Follow-up Provider: [facility] Follow-up appointment confirmed?: No Reason Specialist Follow-Up Not Confirmed: Appointment Sceduled by [facility] Calling Clinician Do you need transportation to your follow-up appointment?: No Do you understand care options if your condition(s) worsen?: Yes-patient verbalized understanding    Marissa, [facility]

## 2023-01-30 NOTE — Patient Instructions (Signed)
Follow up cardiology

## 2023-01-30 NOTE — Progress Notes (Signed)
Subjective:     Patient ID: [name] [name], female    DOB: 11-09-1946, 77 y.o.   MRN: [id number]  Chief Complaint  Patient presents with   Follow-up    ER follow-up for chest pain, HTN     HPI ED f/u-seen yesterday-for cp after drinking coffee and reading devotions. Pain was L side. No rad. Bp was 170/70, 151/70's, 192/80's.  Still having pain  took 2 reg asa.  Strong pain for 1 min, then another few minutes, returns.   W/u neg.  Referred to card.  Had just gotten back from [facility] but stopped every 2 hrs.. Calcium score in 10/2021 of 0.    Dad and pt w/bradycardia.    FH HLD. Bro MI <60 and sis CAD  Health Maintenance Due  Topic Date Due   Medicare Annual Wellness (AWV)  02/27/2023    Past Medical History:  Diagnosis Date   Abnormal Pap smear    Abnormal peripheral vision    RT EYE   Allergy    Anemia    Arthritis    back and neck   Breast cancer ([place]) 1997   right breast   Bruises easily    Cataracts, bilateral    Chronic back pain    Chronic neck pain    Complication of anesthesia    slow to wake up   Family history of adverse reaction to anesthesia    Dad takes a long time to wake up   Frequency of urination    GERD (gastroesophageal reflux disease)    Glaucoma    Goiter    High cholesterol    History of postoperative nausea and vomiting    Hypertension    Hypothyroidism    Neuromuscular disorder (HCC)    Numbness    hands / feet /arms / legs - followed by Dr. Georgann Housekeeper - [place]   Osteoporosis    PONV (postoperative nausea and vomiting)    Post-menopausal    Prediabetes    Sexual dysfunction    Shortness of breath    with exertion and Wt gain   Urinary incontinence     Past Surgical History:  Procedure Laterality Date   BIOPSY THYROID     BREAST SURGERY Right    lymph node removed   CATARACT EXTRACTION, BILATERAL  2009,2010   COLONOSCOPY  02/15/2014   Dr.Stark   EYE SURGERY  08/2012   detached retina surg   LAPAROSCOPY  1978   BTL    lumpectomy  1997   Rt   neck fusion  2010   THYROIDECTOMY N/A 05/04/2013   Procedure:  TOTAL THYROIDECTOMY;  Surgeon: Earnstine Regal, MD;  Location: [facility];  Service: General;  Laterality: N/A;   TOTAL HIP ARTHROPLASTY Right 01/02/2021   Procedure: TOTAL HIP ARTHROPLASTY ANTERIOR APPROACH;  Surgeon: Paralee Cancel, MD;  Location: [facility];  Service: Orthopedics;  Laterality: Right;  70 mins   TUBAL LIGATION      Outpatient Medications Prior to Visit  Medication Sig Dispense Refill   amLODipine (NORVASC) 5 MG tablet Take 1 tablet (5 mg total) by mouth daily. 90 tablet 11   Biotin w/ Vitamins C & E (HAIR/SKIN/NAILS PO) Take by mouth.     denosumab (PROLIA) 60 MG/ML SOSY injection Inject 60 mg into the skin every 6 (six) months.     dorzolamide-timolol (COSOPT) 22.3-6.8 MG/ML ophthalmic solution Place 1 drop into both eyes 2 (two) times daily.      gabapentin (NEURONTIN)  300 MG capsule Take 1 capsule (300 mg total) by mouth 3 (three) times daily. 270 capsule 3   irbesartan (AVAPRO) 300 MG tablet TAKE 1 TABLET BY MOUTH DAILY 90 tablet 3   latanoprost (XALATAN) 0.005 % ophthalmic solution Place 1 drop into the left eye at bedtime.     levothyroxine (SYNTHROID) 88 MCG tablet TAKE 1 TABLET BY MOUTH DAILY (Patient taking differently: Take 88 mcg by mouth daily.) 90 tablet 3   Multiple Vitamin (MULTIVITAMIN ADULT) TABS Take 1 tablet by mouth daily.     polyvinyl alcohol (LIQUIFILM TEARS) 1.4 % ophthalmic solution Place 1 drop into both eyes daily as needed (for dry eyes).     rosuvastatin (CRESTOR) 5 MG tablet TAKE 1 TABLET BY MOUTH TWICE  WEEKLY 26 tablet 3   VITAMIN D PO Take by mouth.     aspirin 81 MG chewable tablet 162 mg 2 (two) times a week.     No facility-administered medications prior to visit.    Allergies  Allergen Reactions   Erythromycin Nausea Only and Nausea And Vomiting    Other reaction(s): Unknown   Ezetimibe     Other reaction(s): myalgias   Latex Other (See Comments)    Other Itching    Unknown Dye PET scan dye possibly= itching   Statins     Other reaction(s): myalgias   ROS neg/noncontributory except as noted HPI/below      Objective:     BP 120/70   Pulse (!) 58   Temp (!) 97.5 F (36.4 C) (Temporal)   Ht '5\' 3"'$  (1.6 m)   Wt 165 lb 6 oz (75 kg)   SpO2 97%   BMI 29.29 kg/m  Wt Readings from Last 3 Encounters:  01/30/23 165 lb 6 oz (75 kg)  01/29/23 165 lb (74.8 kg)  01/27/23 164 lb (74.4 kg)    Physical Exam   Gen: WDWN NAD HEENT: NCAT, conjunctiva not injected, sclera nonicteric NECK:  supple, no thyromegaly, no nodes, no carotid bruits CARDIAC: brady RRR, S1S2+, no murmur. DP 2+B LUNGS: CTAB. No wheezes ABDOMEN:  BS+, soft, NTND, No HSM, no masses EXT:  no edema MSK: no gross abnormalities.  NEURO: A&O x3.  CN II-XII intact.  PSYCH: normal mood. Good eye contact  Reviewewd ER and other notes.      Assessment & Plan:   Problem List Items Addressed This Visit   None Visit Diagnoses     Other chest pain    -  Primary      Atypical cp-ER w/u ok.  Had calcium score of 0 11/13/2021.  Reassuring.  Pt will call and get sch w/Card-has see Dr. Sallyanne Kuster  No orders of the defined types were placed in this encounter.   Wellington Hampshire, MD

## 2023-01-30 NOTE — Telephone Encounter (Signed)
Patient had ER follow-up with PCP on 01/30/23 at 9:30 am.

## 2023-01-31 ENCOUNTER — Encounter: Payer: Self-pay | Admitting: Dietician

## 2023-01-31 ENCOUNTER — Encounter: Payer: Medicare Other | Attending: Internal Medicine | Admitting: Dietician

## 2023-01-31 DIAGNOSIS — R7303 Prediabetes: Secondary | ICD-10-CM | POA: Diagnosis not present

## 2023-01-31 NOTE — Progress Notes (Signed)
Patient was seen on 01/31/2023 for the Core Session 8 of Diabetes Prevention Program course at [facility]. By the end of this session patients are able to complete the following objectives:   Learning Objectives: Recognize positive and negative food and activity cues.  Change negative food and activity cues to positive cues.  Add positive cues for activity and eliminate cues for inactivity.  Develop a plan for removing one problem food cue for the coming week.   Goals:  Record weight taken outside of class.  Track foods and beverages eaten each day in the "Food and Activity Tracker," including calories and fat grams for each item.   Track activity type, minutes you were active, and distance you reached each day in the "Food and Activity Tracker."  Set aside one 20 to 30-minute block of time every day or find two or more periods of 10 to15 minutes each for physical activity.  Remove one problem food cue.  Add one positive cue for being more active.  Follow-Up Plan: Attend Core Session 9 next week.  Bring completed "Food and Activity Tracker" next week to be reviewed by Lifestyle Coach.

## 2023-02-04 ENCOUNTER — Other Ambulatory Visit: Payer: Medicare Other

## 2023-02-05 NOTE — Progress Notes (Signed)
Cardiology Clinic Note   Patient Name: [name] [name] Date of Encounter: 02/07/2023  Primary Care Provider:  Tawnya Crook, MD Primary Cardiologist:  Sanda Klein, MD  Patient Profile    [name] [name] 77 year old female presents the clinic today for follow-up evaluation of her hyperlipidemia.  Past Medical History    Past Medical History:  Diagnosis Date   Abnormal Pap smear    Abnormal peripheral vision    RT EYE   Allergy    Anemia    Arthritis    back and neck   Breast cancer ([place]) 1997   right breast   Bruises easily    Cataracts, bilateral    Chronic back pain    Chronic neck pain    Complication of anesthesia    slow to wake up   Family history of adverse reaction to anesthesia    Dad takes a long time to wake up   Frequency of urination    GERD (gastroesophageal reflux disease)    Glaucoma    Goiter    High cholesterol    History of postoperative nausea and vomiting    Hypertension    Hypothyroidism    Neuromuscular disorder (HCC)    Numbness    hands / feet /arms / legs - followed by Dr. Georgann Housekeeper - [place]   Osteoporosis    PONV (postoperative nausea and vomiting)    Post-menopausal    Prediabetes    Sexual dysfunction    Shortness of breath    with exertion and Wt gain   Urinary incontinence    Past Surgical History:  Procedure Laterality Date   BIOPSY THYROID     BREAST SURGERY Right    lymph node removed   CATARACT EXTRACTION, BILATERAL  2009,2010   COLONOSCOPY  02/15/2014   Dr.Stark   EYE SURGERY  08/2012   detached retina surg   LAPAROSCOPY  1978   BTL   lumpectomy  1997   Rt   neck fusion  2010   THYROIDECTOMY N/A 05/04/2013   Procedure:  TOTAL THYROIDECTOMY;  Surgeon: Earnstine Regal, MD;  Location: [facility];  Service: General;  Laterality: N/A;   TOTAL HIP ARTHROPLASTY Right 01/02/2021   Procedure: TOTAL HIP ARTHROPLASTY ANTERIOR APPROACH;  Surgeon: Paralee Cancel, MD;  Location: [facility];  Service: Orthopedics;   Laterality: Right;  70 mins   TUBAL LIGATION      Allergies  Allergies  Allergen Reactions   Erythromycin Nausea Only and Nausea And Vomiting    Other reaction(s): Unknown   Ezetimibe     Other reaction(s): myalgias   Latex Other (See Comments)   Other Itching    Unknown Dye PET scan dye possibly= itching   Statins     Other reaction(s): myalgias    History of Present Illness    [name] [name] has a PMH of essential hypertension, hypothyroidism, and hyperlipidemia.  She is statin intolerant.  Her coronary calcium score 11/13/2021 showed a score of 1 which placed her in the 1st percentile for age gender and race matched controls.  She was noted to have an aortic valve calcium score of 309.  She has 1 family member with early onset coronary artery disease (her brother had an MI in his 26s).  She was prescribed rosuvastatin 5 mg twice weekly.  She was seen in follow-up by Dr. Sallyanne Kuster on 10/24/2021.  During that time her LDL cholesterol was 209.  Her triglycerides were mildly elevated at  259.  Her hemoglobin A1c was around 5.4.  She was trying to follow a healthy diet but was not exercising regularly due to orthopedic problems to include right total hip replacement and cervical spine surgery.  She presented to the emergency department on 01/29/2023 with complaints of severe left-sided chest pain while she was drinking coffee.  The pain did not radiate.  Her pain was waxing and waning for several minutes at a time and was nonexertional.  She denied diaphoresis and vomiting.  She took aspirin 325 and reported improvement in her pain.  Her blood pressure was noted to be 192/80 and spontaneously improved.  Her EKG showed sinus bradycardia 48 bpm.  She had no respiratory symptoms to suggest PE or pleurisy.  Her serial troponins (8 and 12) were reassuring and her symptoms were felt to be related to GERD.  She presents to the clinic today for follow-up evaluation and states she has had 1 brief  episode of chest discomfort on the right side of her chest.  She feels the discomfort was related to gas.  The discomfort resolved without intervention.  We reviewed her recent emergency department visit and symptoms.  She expressed understanding.  She is very strict with low-cholesterol diet and physical activity.  She enjoys line dancing 3 to 4 days/week.  We reviewed symptoms for acid reflux.  I will give her GERD diet instructions.  I will have her continue her current exercise regimen and plan follow-up in 12 months.  I will also provide her with the [employer] support stocking sheet.  She does note that when she takes long trips her left leg becomes more edematous.  Today she denies chest pain, shortness of breath, lower extremity edema, fatigue, palpitations, melena, hematuria, hemoptysis, diaphoresis, weakness, presyncope, syncope, orthopnea, and PND.    Home Medications    Prior to Admission medications   Medication Sig Start Date End Date Taking? Authorizing Provider  amLODipine (NORVASC) 5 MG tablet Take 1 tablet (5 mg total) by mouth daily. 01/27/23   Serafina Mitchell, MD  Biotin w/ Vitamins C & E (HAIR/SKIN/NAILS PO) Take by mouth.    [provider]  denosumab (PROLIA) 60 MG/ML SOSY injection Inject 60 mg into the skin every 6 (six) months.    [provider]  dorzolamide-timolol (COSOPT) 22.3-6.8 MG/ML ophthalmic solution Place 1 drop into both eyes 2 (two) times daily.     [provider]  gabapentin (NEURONTIN) 300 MG capsule Take 1 capsule (300 mg total) by mouth 3 (three) times daily. 12/25/22   Tawnya Crook, MD  irbesartan (AVAPRO) 300 MG tablet TAKE 1 TABLET BY MOUTH DAILY 12/16/22   Tawnya Crook, MD  latanoprost (XALATAN) 0.005 % ophthalmic solution Place 1 drop into the left eye at bedtime. 05/04/20   [provider]  levothyroxine (SYNTHROID) 88 MCG tablet TAKE 1 TABLET BY MOUTH DAILY Patient taking differently: Take 88 mcg by mouth daily.  12/23/22   Tawnya Crook, MD  Multiple Vitamin (MULTIVITAMIN ADULT) TABS Take 1 tablet by mouth daily.    [provider]  polyvinyl alcohol (LIQUIFILM TEARS) 1.4 % ophthalmic solution Place 1 drop into both eyes daily as needed (for dry eyes).    [provider]  rosuvastatin (CRESTOR) 5 MG tablet TAKE 1 TABLET BY MOUTH TWICE  WEEKLY 10/06/22   Tawnya Crook, MD  VITAMIN D PO Take by mouth.    [provider]    Family History    Family  History  Problem Relation Age of Onset   Hyperlipidemia Mother    Heart disease Mother    Diabetes Mother    Hypertension Mother    Hyperlipidemia Father    Colon cancer Father 81   Hearing loss Sister    Hyperlipidemia Sister    Heart disease Sister    Heart disease Sister    Hyperlipidemia Sister    Stroke Sister    Hypertension Sister    Thyroid disease Sister    Hyperlipidemia Brother    Drug abuse Brother    Early death Brother    Esophageal cancer Brother 70       smoker   Diabetes Brother    Heart disease Brother    Hypertension Brother    Heart attack Brother    Early death Brother    Depression Daughter    Cervical cancer Daughter        dx in her 76s   Colon cancer Paternal Aunt 89   Heart disease Maternal Grandmother    Early death Maternal Grandmother    Diabetes Maternal Grandmother    Asthma Maternal Grandmother    Diabetes Paternal Grandmother    Hypertension Paternal Grandmother    Heart disease Paternal Grandmother    Breast cancer Paternal Grandmother        dx in her 34s   Heart disease Paternal Grandfather    Thyroid disease Other 15       niece   She indicated that her mother is deceased. She indicated that her father is deceased. She indicated that only one of her three sisters is alive. She indicated that both of her brothers are deceased. She indicated that her maternal grandmother is deceased. She indicated that her maternal grandfather is deceased. She indicated that her  paternal grandmother is deceased. She indicated that her paternal grandfather is deceased. She indicated that both of her daughters are alive. She indicated that her son is alive. She indicated that her maternal aunt is deceased. She indicated that both of her maternal uncles are deceased. She indicated that only one of her two paternal aunts is alive. She indicated that her paternal uncle is alive. She indicated that her other is alive.  Social History    Social History   Socioeconomic History   Marital status: Single    Spouse name: Not on file   Number of children: 3   Years of education: 14   Highest education level: Not on file  Occupational History   Occupation: retired  Tobacco Use   Smoking status: Former    Packs/day: 0.50    Years: 3.00    Additional pack years: 0.00    Total pack years: 1.50    Types: Cigarettes    Start date: 01/24/1966    Quit date: 04/27/1968    Years since quitting: 54.8   Smokeless tobacco: Never   Tobacco comments:    quit over 40 years ago  Vaping Use   Vaping Use: Never used  Substance and Sexual Activity   Alcohol use: Yes    Comment: 1 every other month   Drug use: No   Sexual activity: Not Currently  Other Topics Concern   Not on file  Social History Narrative   Patient is single and lives alone.Patient has 3 children and 1 step daughter. 5 grands, +3 steps,  6 great grands(step). 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   Social Determinants of Health   Financial Resource Strain: Low Risk  (02/26/2022)   Overall Financial Resource Strain (CARDIA)    Difficulty of Paying Living Expenses: Not hard at all  Food Insecurity: No Food Insecurity (02/26/2022)   Hunger Vital Sign    Worried About Running Out of Food in the Last Year: Never true    Ran Out of Food in the Last Year: Never true  Transportation Needs: No Transportation  Needs (02/26/2022)   PRAPARE - Hydrologist (Medical): No    Lack of Transportation (Non-Medical): No  Physical Activity: Sufficiently Active (02/26/2022)   Exercise Vital Sign    Days of Exercise per Week: 3 days    Minutes of Exercise per Session: 60 min  Stress: No Stress Concern Present (02/26/2022)   [place]    Feeling of Stress : Not at all  Social Connections: Moderately Integrated (02/26/2022)   Social Connection and Isolation Panel [NHANES]    Frequency of Communication with Friends and Family: More than three times a week    Frequency of Social Gatherings with Friends and Family: More than three times a week    Attends Religious Services: 1 to 4 times per year    Active Member of [employer] or Organizations: Yes    Attends Archivist Meetings: 1 to 4 times per year    Marital Status: Never married  Intimate Partner Violence: Not At Risk (02/26/2022)   Humiliation, Afraid, Rape, and Kick questionnaire    Fear of Current or Ex-Partner: No    Emotionally Abused: No    Physically Abused: No    Sexually Abused: No     Review of Systems    General:  No chills, fever, night sweats or weight changes.  Cardiovascular:  No chest pain, dyspnea on exertion, edema, orthopnea, palpitations, paroxysmal nocturnal dyspnea. Dermatological: No rash, lesions/masses Respiratory: No cough, dyspnea Urologic: No hematuria, dysuria Abdominal:   No nausea, vomiting, diarrhea, bright red blood per rectum, melena, or hematemesis Neurologic:  No visual changes, wkns, changes in mental status. All other systems reviewed and are otherwise negative except as noted above.  Physical Exam    VS:  BP 126/68 (BP Location: Left Arm, Patient Position: Sitting, Cuff Size: Normal)   Ht 5\' 3"  (1.6 m)   Wt 161 lb 12.8 oz (73.4 kg)   BMI 28.66 kg/m  , BMI Body mass index is 28.66 kg/m. GEN: Well nourished, well  developed, in no acute distress. HEENT: normal. Neck: Supple, no JVD, carotid bruits, or masses. Cardiac: RRR, no murmurs, rubs, or gallops. No clubbing, cyanosis, edema.  Radials/DP/PT 2+ and equal bilaterally.  Respiratory:  Respirations regular and unlabored, clear to auscultation bilaterally. GI: Soft, nontender, nondistended, BS + x 4. MS: no deformity or atrophy. Skin: warm and dry, no rash. Neuro:  Strength and sensation are intact. Psych: Normal affect.  Accessory Clinical Findings    Recent Labs: 12/27/2022: ALT 12 01/01/2023: TSH 1.40 01/29/2023: BUN 9; Creatinine, Ser 0.76; Hemoglobin 12.1; Platelets 220; Potassium 3.6; Sodium 135   Recent Lipid Panel    Component Value Date/Time   CHOL 210 (H) 12/27/2022 0911   TRIG 133.0 12/27/2022 0911   HDL 51.50 12/27/2022 0911   CHOLHDL 4 12/27/2022 0911   VLDL 26.6 12/27/2022 0911   LDLCALC 132 (H) 12/27/2022 0911         ECG personally reviewed by me today-none  today.  Assessment & Plan   1.  Chest discomfort-no chest pain today.  Denies further episodes of arm neck back or chest discomfort.  Denies exertional chest discomfort.  Limited in her physical activity due to orthopedic issues.  Reviewed ED visit.  Chest discomfort appears to be related to acid reflux. Reassured that her symptoms were not related to cardiac issues. GERD diet instructions  Hyperlipidemia-LDL 132 on 12/27/22. Continue rosuvastatin Heart healthy low-sodium high-fiber diet Maintain physical activity Follows with PCP  Essential hypertension-BP today 126/68. Heart healthy low-sodium diet-salty 6 given Continue current medical therapy Maintain blood pressure log  Hypothyroidism-TSH 1.4 on 01/01/2023 Continue current medical therapy Follows with PCP  Disposition: Follow-up with Dr. Sallyanne Kuster in 9-12 months.   Jossie Ng. Pharell Rolfson NP-C     02/07/2023, 9:02 AM [place] [address] Office [phone] Fax  [phone]    I spent 14 minutes examining this patient, reviewing medications, and using patient centered shared decision making involving her cardiac care.  Prior to her visit I spent greater than 20 minutes reviewing her past medical history,  medications, and prior cardiac tests.

## 2023-02-07 ENCOUNTER — Encounter (HOSPITAL_BASED_OUTPATIENT_CLINIC_OR_DEPARTMENT_OTHER): Payer: Medicare Other | Admitting: Dietician

## 2023-02-07 ENCOUNTER — Encounter: Payer: Self-pay | Admitting: Dietician

## 2023-02-07 ENCOUNTER — Ambulatory Visit: Payer: Medicare Other | Attending: General Practice | Admitting: General Practice

## 2023-02-07 ENCOUNTER — Encounter: Payer: Self-pay | Admitting: General Practice

## 2023-02-07 VITALS — BP 126/68 | Ht 63.0 in | Wt 161.8 lb

## 2023-02-07 DIAGNOSIS — R7303 Prediabetes: Secondary | ICD-10-CM | POA: Diagnosis not present

## 2023-02-07 DIAGNOSIS — I10 Essential (primary) hypertension: Secondary | ICD-10-CM | POA: Diagnosis not present

## 2023-02-07 DIAGNOSIS — R0789 Other chest pain: Secondary | ICD-10-CM | POA: Diagnosis not present

## 2023-02-07 DIAGNOSIS — E7801 Familial hypercholesterolemia: Secondary | ICD-10-CM

## 2023-02-07 DIAGNOSIS — E039 Hypothyroidism, unspecified: Secondary | ICD-10-CM

## 2023-02-07 NOTE — Progress Notes (Signed)
Patient was seen on 02/07/2023 for the Core Session 9 of Diabetes Prevention Program course at [facility]. By the end of this session patients are able to complete the following objectives:   Learning Objectives: List and describe five steps to problem solving.  Apply the five problem solving steps to resolve a problem he or she has with eating less fat and fewer calories or being more active.   Goals:  Record weight taken outside of class.  Track foods and beverages eaten each day in the "Food and Activity Tracker," including calories and fat grams for each item.   Track activity type, minutes you were active, and distance you reached each day in the "Food and Activity Tracker."  Set aside one 20 to 30-minute block of time every day or find two or more periods of 10 to15 minutes each for physical activity.  Use problem solving action plan created during session to problem solve.   Follow-Up Plan: Attend Core Session 10 next week.  Bring completed "Food and Activity Tracker" next week to be reviewed by Lifestyle Coach. Bring menus from favorite restaurants to next session for future discussion.

## 2023-02-07 NOTE — Patient Instructions (Signed)
Medication Instructions:  The current medical regimen is effective;  continue present plan and medications as directed. Please refer to the Current Medication list given to you today.  *If you need a refill on your cardiac medications before your next appointment, please call your pharmacy*   Lab Work: NONE If you have labs (blood work) drawn today and your tests are completely normal, you will receive your results only by:  [place] (if you have MyChart) OR  A paper copy in the mail If you have any lab test that is abnormal or we need to change your treatment, we will call you to review the results.  Testing/Procedures: NONE  Follow-Up: At [facility], you and your health needs are our priority.  As part of our continuing mission to provide you with exceptional heart care, we have created designated Provider Care Teams.  These Care Teams include your primary Cardiologist (physician) and Advanced Practice Providers (APPs -  Physician Assistants and Nurse Practitioners) who all work together to provide you with the care you need, when you need it.  Your next appointment:   12 month(s)  Provider:   Sanda Klein, MD     Other Instructions PLEASE READ AND FOLLOW GERD DIET ATTACHED  PLEASE CONTINUE YOUR DIET AND EXERCISE    Food Choices for Gastroesophageal Reflux Disease, Adult When you have gastroesophageal reflux disease (GERD), the foods you eat and your eating habits are very important. Choosing the right foods can help ease your discomfort. Think about working with a food expert (dietitian) to help you make good choices. What are tips for following this plan? Reading food labels Look for foods that are low in saturated fat. Foods that may help with your symptoms include: Foods that have less than 5% of daily value (DV) of fat. Foods that have 0 grams of trans fat. Cooking Do not fry your food. Cook your food by baking, steaming, grilling, or broiling. These  are all methods that do not need a lot of fat for cooking. To add flavor, try to use herbs that are low in spice and acidity. Meal planning  Choose healthy foods that are low in fat, such as: Fruits and vegetables. Whole grains. Low-fat dairy products. Lean meats, fish, and poultry. Eat small meals often instead of eating 3 large meals each day. Eat your meals slowly in a place where you are relaxed. Avoid bending over or lying down until 2-3 hours after eating. Limit high-fat foods such as fatty meats or fried foods. Limit your intake of fatty foods, such as oils, butter, and shortening. Avoid the following as told by your doctor: Foods that cause symptoms. These may be different for different people. Keep a food diary to keep track of foods that cause symptoms. Alcohol. Drinking a lot of liquid with meals. Eating meals during the 2-3 hours before bed. Lifestyle Stay at a healthy weight. Ask your doctor what weight is healthy for you. If you need to lose weight, work with your doctor to do so safely. Exercise for at least 30 minutes on 5 or more days each week, or as told by your doctor. Wear loose-fitting clothes. Do not smoke or use any products that contain nicotine or tobacco. If you need help quitting, ask your doctor. Sleep with the head of your bed higher than your feet. Use a wedge under the mattress or blocks under the bed frame to raise the head of the bed. Chew sugar-free gum after meals. What foods  should eat?  Eat a healthy, well-balanced diet of fruits, vegetables, whole grains, low-fat dairy products, lean meats, fish, and poultry. Each person is different. Foods that may cause symptoms in one person may not cause any symptoms in another person. Work with your doctor to find foods that are safe for you. The items listed above may not be a complete list of what you can eat and drink. Contact a food expert for more options. What foods should I avoid? Limiting some of these  foods may help in managing the symptoms of GERD. Everyone is different. Talk with a food expert or your doctor to help you find the exact foods to avoid, if any. Fruits Any fruits prepared with added fat. Any fruits that cause symptoms. For some people, this may include citrus fruits, such as oranges, grapefruit, pineapple, and lemons. Vegetables Deep-fried vegetables. [place] fries. Any vegetables prepared with added fat. Any vegetables that cause symptoms. For some people, this may include tomatoes and tomato products, chili peppers, onions and garlic, and horseradish. Grains Pastries or quick breads with added fat. Meats and other proteins High-fat meats, such as fatty beef or pork, hot dogs, ribs, ham, sausage, salami, and bacon. Fried meat or protein, including fried fish and fried chicken. Nuts and nut butters, in large amounts. Dairy Whole milk and chocolate milk. Sour cream. Cream. Ice cream. Cream cheese. Milkshakes. Fats and oils Butter. Margarine. Shortening. Ghee. Beverages Coffee and tea, with or without caffeine. Carbonated beverages. Sodas. Energy drinks. Fruit juice made with acidic fruits, such as orange or grapefruit. Tomato juice. Alcoholic drinks. Sweets and desserts Chocolate and cocoa. Donuts. Seasonings and condiments Pepper. Peppermint and spearmint. Added salt. Any condiments, herbs, or seasonings that cause symptoms. For some people, this may include curry, hot sauce, or vinegar-based salad dressings. The items listed above may not be a complete list of what you should not eat and drink. Contact a food expert for more options. Questions to ask your doctor Diet and lifestyle changes are often the first steps that are taken to manage symptoms of GERD. If diet and lifestyle changes do not help, talk with your doctor about taking medicines. Where to find more information International Foundation for Gastrointestinal Disorders: aboutgerd.org Summary When you have GERD,  food and lifestyle choices are very important in easing your symptoms. Eat small meals often instead of 3 large meals a day. Eat your meals slowly and in a place where you are relaxed. Avoid bending over or lying down until 2-3 hours after eating. Limit high-fat foods such as fatty meats or fried foods. This information is not intended to replace advice given to you by your health care provider. Make sure you discuss any questions you have with your health care provider. Document Revised: 05/22/2020 Document Reviewed: 05/22/2020 Elsevier Patient Education  [place].

## 2023-02-11 ENCOUNTER — Encounter: Payer: Medicare Other | Attending: Internal Medicine | Admitting: Dietician

## 2023-02-11 ENCOUNTER — Encounter: Payer: Self-pay | Admitting: Dietician

## 2023-02-11 DIAGNOSIS — R7303 Prediabetes: Secondary | ICD-10-CM | POA: Diagnosis not present

## 2023-02-11 NOTE — Progress Notes (Signed)
Patient was seen on 02/11/2023 for the Core Session 7 of Diabetes Prevention Program course at [facility]. By the end of this session patients are able to complete the following objectives:   Learning Objectives: Define calorie balance. Explain how healthy eating and being active are related in terms of calorie balance.  Describe the relationship between calorie balance and weight loss.  Describe his or her progress as it relates to calorie balance.  Develop an activity plan for the coming week.   Goals:  Record weight taken outside of class.  Track foods and beverages eaten each day in the "Food and Activity Tracker," including calories and fat grams for each item.   Track activity type, minutes you were active, and distance you reached each day in the "Food and Activity Tracker."  Set aside one 20 to 30-minute block of time every day or find two or more periods of 10 to15 minutes each for physical activity.  Make a Physical Activities Plan for the Week.  Make active lifestyle choices all through the day  Stay at or go slightly over activity goal.   Follow-Up Plan: Attend Core Session 8 next week.  Bring completed "Food and Activity Tracker" next week to be reviewed by Lifestyle Coach.

## 2023-02-14 ENCOUNTER — Encounter: Payer: Self-pay | Admitting: Dietician

## 2023-02-14 ENCOUNTER — Encounter (HOSPITAL_BASED_OUTPATIENT_CLINIC_OR_DEPARTMENT_OTHER): Payer: Medicare Other | Admitting: Dietician

## 2023-02-14 DIAGNOSIS — R7303 Prediabetes: Secondary | ICD-10-CM | POA: Diagnosis not present

## 2023-02-14 NOTE — Progress Notes (Signed)
Patient was seen on 02/14/2023 for the Core Session 10 of Diabetes Prevention Program course at [facility]. By the end of this session patients are able to complete the following objectives:   Learning Objectives: List and describe the four keys for healthy eating out.  Give examples of how to apply these keys at the type of restaurants that the participants go to regularly.  Make an appropriate meal selection from a restaurant menu.  Demonstrate how to ask for a substitute item using assertive language and a polite tone of voice.    Goals:  Record weight taken outside of class.  Track foods and beverages eaten each day in the "Food and Activity Tracker," including calories and fat grams for each item.   Track activity type, minutes you were active, and distance you reached each day in the "Food and Activity Tracker."  Set aside one 20 to 30-minute block of time every day or find two or more periods of 10 to15 minutes each for physical activity.  Utilize positive action plan and complete questions on "To Do List."   Follow-Up Plan: Attend Core Session 11 next week.  Bring completed "Food and Activity Tracker" next week to be reviewed by Lifestyle Coach.

## 2023-02-28 ENCOUNTER — Encounter: Payer: Self-pay | Admitting: Dietician

## 2023-02-28 ENCOUNTER — Encounter: Payer: Medicare Other | Attending: Internal Medicine | Admitting: Dietician

## 2023-02-28 DIAGNOSIS — R7303 Prediabetes: Secondary | ICD-10-CM | POA: Diagnosis not present

## 2023-02-28 NOTE — Progress Notes (Signed)
On 02/28/2023 completed Session 11 of Diabetes Prevention Program course  with [facility]. By the end of this session patients are able to complete the following objectives:   Learning Objectives: Give examples of negative thoughts that could prevent them from meeting their goals of losing weight and being more physically active.  Describe how to stop negative thoughts and talk back to them with positive thoughts.  Practice 1) stopping negative thoughts and 2) talking back to negative thoughts with positive ones.    Goals:  Record weight taken outside of class.  Track foods and beverages eaten each day in the "Food and Activity Tracker," including calories and fat grams for each item.   Track activity type, minutes you were active, and distance you reached each day in the "Food and Activity Tracker."  If you have any negative thoughts-write them in your Food and Activity Trackers, along with how you talked back to them. Practice stopping negative thoughts and talking back to them with positive thoughts.   Follow-Up Plan: Attend Core Session 12 next week.  Bring completed "Food and Activity Tracker" next week to be reviewed by Lifestyle Coach.  

## 2023-03-07 ENCOUNTER — Encounter: Payer: Medicare Other | Admitting: Dietician

## 2023-03-07 ENCOUNTER — Encounter: Payer: Self-pay | Admitting: Dietician

## 2023-03-07 DIAGNOSIS — R7303 Prediabetes: Secondary | ICD-10-CM

## 2023-03-07 NOTE — Progress Notes (Signed)
Patient was seen on 03/07/2023 for the Core Session 12 of Diabetes Prevention Program course at [facility]. By the end of this session patients are able to complete the following objectives:   Learning Objectives: Describe their current progress toward defined goals. Describe common causes for slipping from healthy eating or being active. Explain what to do to get back on their feet after a slip.  Goals:  Record weight taken outside of class.  Track foods and beverages eaten each day in the "Food and Activity Tracker," including calories and fat grams for each item.   Track activity type, minutes active, and distance reached each day in the "Food and Activity Tracker."  Try out the two action plans created during session- "Slips from Healthy Eating: Action Plan" and "Slips from Being Active: Action Plan" Answer questions on the handout.   Follow-Up Plan: Attend Core Session 13 next week.  Bring completed "Food and Activity Tracker" next week to be reviewed by Lifestyle Coach.  

## 2023-03-21 ENCOUNTER — Encounter (HOSPITAL_BASED_OUTPATIENT_CLINIC_OR_DEPARTMENT_OTHER): Payer: Medicare Other | Admitting: Dietician

## 2023-03-21 ENCOUNTER — Encounter: Payer: Self-pay | Admitting: Dietician

## 2023-03-21 DIAGNOSIS — R7303 Prediabetes: Secondary | ICD-10-CM | POA: Diagnosis not present

## 2023-03-21 NOTE — Progress Notes (Signed)
Patient was seen on 03/21/2023 for the Core Session 13 of Diabetes Prevention Program course at [facility]. By the end of this session patients are able to complete the following objectives:   Learning Objectives: Describe ways to add interest and variety to their activity plans. Define ?aerobic fitness. Explain the four F.I.T.T. principles (frequency, intensity, time, and type of activity) and how they relate to aerobic fitness.   Goals:  Record weight taken outside of class.  Track foods and beverages eaten each day in the "Food and Activity Tracker," including calories and fat grams for each item.   Track activity type, minutes you were active, and distance you reached each day in the "Food and Activity Tracker."  Do your best to reach activity goal for the week. Use one of the F.I.T.T. principles to jump start workouts. Document activity level on the "To Do Next Week" handout.  Follow-Up Plan: Attend Core Session 14 next week.  Bring completed "Food and Activity Tracker" next week to be reviewed by Lifestyle Coach.  

## 2023-03-28 ENCOUNTER — Encounter: Payer: Self-pay | Admitting: Dietician

## 2023-03-28 ENCOUNTER — Encounter: Payer: Medicare Other | Attending: Internal Medicine | Admitting: Dietician

## 2023-03-28 DIAGNOSIS — R7303 Prediabetes: Secondary | ICD-10-CM | POA: Diagnosis not present

## 2023-03-28 NOTE — Progress Notes (Signed)
Patient was seen on 03/28/2023 for the Core Session 14 of Diabetes Prevention Program course at [facility]. By the end of this session patients are able to complete the following objectives:   Learning Objectives: Give examples of problem social cues and helpful social cues.  Explain how to remove problem social cues and add helpful ones.  Describe ways of coping with vacations and social events such as parties, holidays, and visits from relatives and friends.  Create an action plan to change a problem social cue and add a helpful one.   Goals:  Record weight taken outside of class.  Track foods and beverages eaten each day in the "Food and Activity Tracker," including calories and fat grams for each item.   Track activity type, minutes you were active, and distance you reached each day in the "Food and Activity Tracker."  Do your best to reach activity goal for the week. Use action plan created during session to change a problem social cue and add a helpful social cue.  Answer questions regarding success of changing social cues on "To Do Next Week" handout.   Follow-Up Plan: Attend Core Session 15 next week.  Bring completed "Food and Activity Tracker" next week to be reviewed by Lifestyle Coach.  

## 2023-04-02 LAB — HM MAMMOGRAPHY

## 2023-04-04 ENCOUNTER — Encounter (HOSPITAL_BASED_OUTPATIENT_CLINIC_OR_DEPARTMENT_OTHER): Payer: Medicare Other | Admitting: Dietician

## 2023-04-04 ENCOUNTER — Encounter: Payer: Self-pay | Admitting: Dietician

## 2023-04-04 DIAGNOSIS — R7303 Prediabetes: Secondary | ICD-10-CM

## 2023-04-04 NOTE — Progress Notes (Signed)
Patient was seen on 04/04/2023 for the Core Session 15 of Diabetes Prevention Program course at [facility]. By the end of this session patients are able to complete the following objectives:   Learning Objectives: Explain how to prevent stress or cope with unavoidable stress.  Describe how this program can be a source of stress.  Explain how to manage stressful situations.  Create and follow an action plan for either preventing or coping with a stressful situation.   Goals:  Record weight taken outside of class.  Track foods and beverages eaten each day in the "Food and Activity Tracker," including calories and fat grams for each item.   Track activity type, minutes you were active, and distance you reached each day in the "Food and Activity Tracker."  Do your best to reach activity goal for the week. Follow your action plan to reduce stress.  Answer questions on handout regarding success of action plan.   Follow-Up Plan: Attend Core Session 16 next week.  Bring completed "Food and Activity Tracker" next week to be reviewed by Lifestyle Coach.

## 2023-04-11 ENCOUNTER — Encounter: Payer: Self-pay | Admitting: Dietician

## 2023-04-11 ENCOUNTER — Encounter: Payer: Medicare Other | Admitting: Dietician

## 2023-04-11 DIAGNOSIS — R7303 Prediabetes: Secondary | ICD-10-CM

## 2023-04-11 NOTE — Progress Notes (Signed)
On 04/11/23 patient completed a post core session of the Diabetes Prevention Program course with [facility]. By the end of this session patients are able to complete the following objectives:    Learning Objectives: Identify which foods contain carbohydrates.  List functions for carbohydrates on the body.  Describe the relationship between carbohydrate intake and blood sugar.  Create balanced snack choices.   Goals:  Record weight taken outside of class.  Track foods and beverages eaten each day in the "Food and Activity Tracker," including calories and fat grams for each item.   Track activity type, minutes you were active, and distance you reached each day in the "Food and Activity Tracker."   Follow-Up Plan: Attend next session.  Email completed "Food and Activity Trackers" before next session to be reviewed by Lifestyle Coach.  

## 2023-05-16 ENCOUNTER — Ambulatory Visit (HOSPITAL_BASED_OUTPATIENT_CLINIC_OR_DEPARTMENT_OTHER): Payer: Medicare Other | Admitting: Dietician

## 2023-05-16 ENCOUNTER — Encounter: Payer: Self-pay | Admitting: Dietician

## 2023-05-16 DIAGNOSIS — R7303 Prediabetes: Secondary | ICD-10-CM | POA: Diagnosis not present

## 2023-05-16 NOTE — Progress Notes (Signed)
Patient was seen on 05/16/2023 for the Core Session 16 of Diabetes Prevention Program course at [facility]. By the end of this session patients are able to complete the following objectives:   Learning Objectives: Measure their progress toward weight and physical activity goals since Session 1.  Develop a plan for improving progress, if their goals have not yet been attained.  Describe ways to stay motivated long-term.   Goals:  Record weight taken outside of class.  Track foods and beverages eaten each day in the "Food and Activity Tracker," including calories and fat grams for each item.   Track activity type, minutes you were active, and distance you reached each day in the "Food and Activity Tracker."  Utilize action plan to help stay motivated and complete questions on "To Do List."   Follow-Up Plan: Attend session 17 in two weeks.  Bring completed "Food and Activity Tracker" next session to be reviewed by Lifestyle Coach.  

## 2023-06-06 ENCOUNTER — Encounter: Payer: Self-pay | Admitting: Dietician

## 2023-06-06 ENCOUNTER — Encounter: Payer: Medicare Other | Attending: Internal Medicine | Admitting: Dietician

## 2023-06-06 DIAGNOSIS — R7303 Prediabetes: Secondary | ICD-10-CM | POA: Insufficient documentation

## 2023-06-06 NOTE — Progress Notes (Signed)
Patient was seen on 06/06/2023 for Session 17 of Diabetes Prevention Program course at [facility]. By the end of this session patients are able to complete the following objectives:   Learning Objectives: Identify how to maintain and/or continue working toward program goals for the remainder of the program.  Describe ways that food and activity tracking can assist them in maintaining/reaching program goals.  Identify progress they have made since the beginning of the program.   Goals:  Record weight taken outside of class.  Track foods and beverages eaten each day in the "Food and Activity Tracker," including calories and fat grams for each item.   Track activity type, minutes you were active, and distance you reached each day in the "Food and Activity Tracker."   Follow-Up Plan: Attend session 18 in two weeks.  Bring completed "Food and Activity Trackers" next session to be reviewed by Lifestyle Coach.

## 2023-06-18 ENCOUNTER — Ambulatory Visit: Payer: Medicare Other | Admitting: Family Medicine

## 2023-06-18 ENCOUNTER — Encounter: Payer: Self-pay | Admitting: Family Medicine

## 2023-06-18 VITALS — BP 128/64 | HR 44 | Temp 98.0°F | Resp 16 | Ht 63.0 in | Wt 155.0 lb

## 2023-06-18 DIAGNOSIS — R7303 Prediabetes: Secondary | ICD-10-CM | POA: Diagnosis not present

## 2023-06-18 DIAGNOSIS — I10 Essential (primary) hypertension: Secondary | ICD-10-CM | POA: Diagnosis not present

## 2023-06-18 DIAGNOSIS — G894 Chronic pain syndrome: Secondary | ICD-10-CM

## 2023-06-18 DIAGNOSIS — E7800 Pure hypercholesterolemia, unspecified: Secondary | ICD-10-CM

## 2023-06-18 DIAGNOSIS — E890 Postprocedural hypothyroidism: Secondary | ICD-10-CM

## 2023-06-18 LAB — CBC WITH DIFFERENTIAL/PLATELET
Basophils Absolute: 0 10*3/uL (ref 0.0–0.1)
Basophils Relative: 0.6 % (ref 0.0–3.0)
Eosinophils Absolute: 0.1 10*3/uL (ref 0.0–0.7)
Eosinophils Relative: 1.1 % (ref 0.0–5.0)
HCT: 38.7 % (ref 36.0–46.0)
Hemoglobin: 12.8 g/dL (ref 12.0–15.0)
Lymphocytes Relative: 37.2 % (ref 12.0–46.0)
Lymphs Abs: 2.3 10*3/uL (ref 0.7–4.0)
MCHC: 33 g/dL (ref 30.0–36.0)
MCV: 94.7 fl (ref 78.0–100.0)
Monocytes Absolute: 0.5 10*3/uL (ref 0.1–1.0)
Monocytes Relative: 7.9 % (ref 3.0–12.0)
Neutro Abs: 3.4 10*3/uL (ref 1.4–7.7)
Neutrophils Relative %: 53.2 % (ref 43.0–77.0)
Platelets: 254 10*3/uL (ref 150.0–400.0)
RBC: 4.09 Mil/uL (ref 3.87–5.11)
RDW: 13 % (ref 11.5–15.5)
WBC: 6.3 10*3/uL (ref 4.0–10.5)

## 2023-06-18 LAB — COMPREHENSIVE METABOLIC PANEL
ALT: 13 U/L (ref 0–35)
AST: 19 U/L (ref 0–37)
Albumin: 4.3 g/dL (ref 3.5–5.2)
Alkaline Phosphatase: 43 U/L (ref 39–117)
BUN: 15 mg/dL (ref 6–23)
CO2: 26 mEq/L (ref 19–32)
Calcium: 9.5 mg/dL (ref 8.4–10.5)
Chloride: 99 mEq/L (ref 96–112)
Creatinine, Ser: 0.6 mg/dL (ref 0.40–1.20)
GFR: 87.27 mL/min (ref >60.00)
Glucose, Bld: 83 mg/dL (ref 70–99)
Potassium: 4 mEq/L (ref 3.5–5.1)
Sodium: 134 mEq/L — ABNORMAL LOW (ref 135–145)
Total Bilirubin: 0.7 mg/dL (ref 0.2–1.2)
Total Protein: 7.1 g/dL (ref 6.0–8.3)

## 2023-06-18 LAB — LIPID PANEL
Cholesterol: 247 mg/dL — ABNORMAL HIGH (ref 0–200)
HDL: 50.2 mg/dL (ref >39.00)
LDL Cholesterol: 168 mg/dL — ABNORMAL HIGH (ref 0–99)
NonHDL: 196.35
Total CHOL/HDL Ratio: 5
Triglycerides: 140 mg/dL (ref 0.0–149.0)
VLDL: 28 mg/dL (ref 0.0–40.0)

## 2023-06-18 LAB — TSH: TSH: 0.39 u[IU]/mL (ref 0.35–5.50)

## 2023-06-18 LAB — HEMOGLOBIN A1C: Hgb A1c MFr Bld: 5.4 % (ref 4.6–6.5)

## 2023-06-18 NOTE — Patient Instructions (Addendum)
It was very nice to see you today!  Hold cholesterol med(rosuvastatin) for 2 wks and see if weakness/pain improve    get X-ray/labs at [facility].  [address]  hours 8=M-F 8:30-5.  closed 12:30-1 lunch    PLEASE NOTE:  If you had any lab tests please let us know if you have not heard back within a few days. You may see your results on MyChart before we have a chance to review them but we will give you a call once they are reviewed by [place]. If we ordered any referrals today, please let us know if you have not heard from their office within the next week.   Please try these tips to maintain a healthy lifestyle:  Eat most of your calories during the day when you are active. Eliminate processed foods including packaged sweets (pies, cakes, cookies), reduce intake of potatoes, white bread, white pasta, and white rice. Look for whole grain options, oat flour or almond flour.  Each meal should contain half fruits/vegetables, one quarter protein, and one quarter carbs (no bigger than a computer mouse).  Cut down on sweet beverages. This includes juice, soda, and sweet tea. Also watch fruit intake, though this is a healthier sweet option, it still contains natural sugar! Limit to 3 servings daily.  Drink at least 1 glass of water with each meal and aim for at least 8 glasses per day  Exercise at least 150 minutes every week.

## 2023-06-18 NOTE — Progress Notes (Signed)
Subjective:     Patient ID: [name] [name], female    DOB: 12-29-1945, 77 y.o.   MRN: [id number]  Chief Complaint  Patient presents with   Medical Management of Chronic Issues    6 month follow-up on htn, hld, predm Had coffee, no food Been having more pain since taking prolia   Neck Issue    Been having problems with neck pain    HPI  HTN - Pt is on Amlodipine. Stopped taking Irbesartan a couple weeks ago due to too low BP readings at 80s/40s. Started monitoring her blood pressure daily and after stopping Irbesartan BP was 110-120s/70-80s. This morning it was 130/80. Has also started only drinking decaf. Bp was 140/80 at initial check and improved to 128/64 when rechecked. No ha/dizziness/cp/palp/cough/sob.  Neck pain - She complains of recently neck pain. Believes having a rod in her neck along with her osteoporosis could be worsening the neck pain. Is interested in a referral to neurology.   Mid back pain - She reports mid back pain when doing activities when moving her arms or using her hands, such as doing the dishes. States pain is greater across her bra line. No abd pain.   LE swelling - Worsens when not eating well with high sodium or when she is on her feet for long periods. Has not tried compression socks. Swelling did not worsen when she stopped irbesartan.   HLD - On Rosuvastatin 5 mg 2x/wk.   PreDM -. Has started seeing a nutritionist.   Osteoporosis - Is taking Prolia 60 mg.  Follows up with Dr. Sharl Ma from endocrinology  Fatigue - She has been getting fatigued more easily-like w/doing dishes.    Health Maintenance Due  Topic Date Due   Medicare Annual Wellness (AWV)  02/27/2023    Past Medical History:  Diagnosis Date   Abnormal Pap smear    Abnormal peripheral vision    RT EYE   Allergy    Anemia    Arthritis    back and neck   Breast cancer (HCC) 1997   right breast   Bruises easily    Cataracts, bilateral    Chronic back pain    Chronic neck  pain    Complication of anesthesia    slow to wake up   Family history of adverse reaction to anesthesia    Dad takes a long time to wake up   Frequency of urination    GERD (gastroesophageal reflux disease)    Glaucoma    Goiter    High cholesterol    History of postoperative nausea and vomiting    Hypertension    Hypothyroidism    Neuromuscular disorder (HCC)    Numbness    hands / feet /arms / legs - followed by Dr. Sol Passer - [place]   Osteoporosis    PONV (postoperative nausea and vomiting)    Post-menopausal    Prediabetes    Sexual dysfunction    Shortness of breath    with exertion and Wt gain   Urinary incontinence     Past Surgical History:  Procedure Laterality Date   BIOPSY THYROID     BREAST SURGERY Right    lymph node removed   CATARACT EXTRACTION, BILATERAL  2009,2010   COLONOSCOPY  02/15/2014   Dr.Stark   EYE SURGERY  08/2012   detached retina surg   LAPAROSCOPY  1978   BTL   lumpectomy  1997   Rt   neck fusion  2010   THYROIDECTOMY N/A 05/04/2013   Procedure:  TOTAL THYROIDECTOMY;  Surgeon: Velora Heckler, MD;  Location: [facility];  Service: General;  Laterality: N/A;   TOTAL HIP ARTHROPLASTY Right 01/02/2021   Procedure: TOTAL HIP ARTHROPLASTY ANTERIOR APPROACH;  Surgeon: Durene Romans, MD;  Location: [facility];  Service: Orthopedics;  Laterality: Right;  70 mins   TUBAL LIGATION       Current Outpatient Medications:    amLODipine (NORVASC) 5 MG tablet, Take 1 tablet (5 mg total) by mouth daily., Disp: 90 tablet, Rfl: 11   Biotin w/ Vitamins C & E (HAIR/SKIN/NAILS PO), Take by mouth., Disp: , Rfl:    denosumab (PROLIA) 60 MG/ML SOSY injection, Inject 60 mg into the skin every 6 (six) months., Disp: , Rfl:    dorzolamide-timolol (COSOPT) 22.3-6.8 MG/ML ophthalmic solution, Place 1 drop into both eyes 2 (two) times daily. , Disp: , Rfl:    gabapentin (NEURONTIN) 300 MG capsule, Take 1 capsule (300 mg total) by mouth 3 (three) times daily., Disp: 270  capsule, Rfl: 3   irbesartan (AVAPRO) 300 MG tablet, TAKE 1 TABLET BY MOUTH DAILY, Disp: 90 tablet, Rfl: 3   latanoprost (XALATAN) 0.005 % ophthalmic solution, Place 1 drop into the left eye at bedtime., Disp: , Rfl:    levothyroxine (SYNTHROID) 88 MCG tablet, TAKE 1 TABLET BY MOUTH DAILY (Patient taking differently: Take 88 mcg by mouth daily.), Disp: 90 tablet, Rfl: 3   Multiple Vitamin (MULTIVITAMIN ADULT) TABS, Take 1 tablet by mouth daily., Disp: , Rfl:    polyvinyl alcohol (LIQUIFILM TEARS) 1.4 % ophthalmic solution, Place 1 drop into both eyes daily as needed (for dry eyes)., Disp: , Rfl:    rosuvastatin (CRESTOR) 5 MG tablet, TAKE 1 TABLET BY MOUTH TWICE  WEEKLY, Disp: 26 tablet, Rfl: 3   VITAMIN D PO, Take by mouth., Disp: , Rfl:   Allergies  Allergen Reactions   Erythromycin Nausea Only and Nausea And Vomiting    Other reaction(s): Unknown   Ezetimibe     Other reaction(s): myalgias   Latex Other (See Comments)   Other Itching    Unknown Dye PET scan dye possibly= itching   Statins     Other reaction(s): myalgias   ROS neg/noncontributory except as noted HPI/below      Objective:     BP 128/64 (BP Location: Left Arm, Patient Position: Sitting, Cuff Size: Large)   Pulse (!) 44   Temp 98 F (36.7 C) (Temporal)   Resp 16   Ht 5\' 3"  (1.6 m)   Wt 155 lb (70.3 kg)   SpO2 95%   BMI 27.46 kg/m  Wt Readings from Last 3 Encounters:  06/18/23 155 lb (70.3 kg)  02/07/23 161 lb 12.8 oz (73.4 kg)  01/30/23 165 lb 6 oz (75 kg)    Physical Exam   Gen: [facility] NAD HEENT: NCAT, conjunctiva not injected, sclera nonicteric NECK:  supple, no nodes, no carotid bruits. Thyroid absent.  CARDIAC: +Bradycardia, RRR, S1S2+, DP 2+B. +1/6 murmur. LUNGS: CTAB. No wheezes ABDOMEN:  BS+, soft, NTND, No HSM, no masses EXT:  no edema MSK: no gross abnormalities. MS 5/5 BUE NEURO: A&O x3.  CN II-XII intact.  PSYCH: normal mood. Good eye contact  No TTP cervical and thoracic spine.     Assessment & Plan:  Essential hypertension, benign -     Comprehensive metabolic panel -     CBC with Differential/Platelet  Prediabetes -     Comprehensive metabolic  panel -     Hemoglobin A1c  High cholesterol -     Lipid panel -     Comprehensive metabolic panel  Postoperative hypothyroidism -     TSH  Chronic pain syndrome -     DG Cervical Spine Complete; Future -     DG Thoracic Spine 2 View; Future   HTN-chronic.  Went low after wt loss so pt stopped avapro.  Still on amlodipine 5mg .  Controlled for now.  Continue to monitor.  If increases, re start avapro but at 150mg  daily.   PreDM-working on diet/exercise.  Seeing nutritionist.  Continue HLD-chronic.  Intolerant of statins but doing fair on crestor 5mg  2x/wk.  Hold for 2 wks to see if causing the weakness Post op hypothyroidism-managed by endo Chronic neck pain and low thoracic-some weakness as well in arms.  Check x-rays.  Has rods in neck.  May need neuro.  Also, hold the statin to see if helps.   Return in about 6 months (around 12/19/2023) for HTN, preDM.   I,Rachel Rivera,acting as a scribe for Angelena Sole, MD.,have documented all relevant documentation on the behalf of Angelena Sole, MD,as directed by  Angelena Sole, MD while in the presence of Angelena Sole, MD.  I, Angelena Sole, MD, have reviewed all documentation for this visit. The documentation on 06/18/23 for the exam, diagnosis, procedures, and orders are all accurate and complete.   Angelena Sole, MD

## 2023-06-19 NOTE — Progress Notes (Signed)
Labs are ok except cholesterol-but will address that later.   Thinking more, the arm pain and fatigue could be some other things as well-please place orders for cpk,sed rate and crp

## 2023-06-20 ENCOUNTER — Ambulatory Visit (INDEPENDENT_AMBULATORY_CARE_PROVIDER_SITE_OTHER)
Admission: RE | Admit: 2023-06-20 | Discharge: 2023-06-20 | Disposition: A | Discharge status: Home / Self Care | Payer: Medicare Other | Source: Ambulatory Visit | Attending: Family Medicine | Admitting: Family Medicine

## 2023-06-20 DIAGNOSIS — G894 Chronic pain syndrome: Secondary | ICD-10-CM

## 2023-06-24 ENCOUNTER — Other Ambulatory Visit: Payer: Self-pay | Admitting: *Deleted

## 2023-06-24 DIAGNOSIS — M79603 Pain in arm, unspecified: Secondary | ICD-10-CM

## 2023-07-02 ENCOUNTER — Other Ambulatory Visit (INDEPENDENT_AMBULATORY_CARE_PROVIDER_SITE_OTHER): Payer: Medicare Other

## 2023-07-02 DIAGNOSIS — M79603 Pain in arm, unspecified: Secondary | ICD-10-CM

## 2023-07-02 LAB — CK: Total CK: 85 U/L (ref 7–177)

## 2023-07-02 LAB — SEDIMENTATION RATE: Sed Rate: 29 mm/hr (ref 0–30)

## 2023-07-02 LAB — C-REACTIVE PROTEIN: CRP: <1 mg/dL (ref 0.5–20.0)

## 2023-07-02 NOTE — Progress Notes (Signed)
Labs normal-how is she feeling?

## 2023-07-04 ENCOUNTER — Encounter: Payer: Self-pay | Admitting: Dietician

## 2023-07-04 ENCOUNTER — Encounter: Payer: Medicare Other | Attending: Internal Medicine | Admitting: Dietician

## 2023-07-04 DIAGNOSIS — R7303 Prediabetes: Secondary | ICD-10-CM | POA: Insufficient documentation

## 2023-07-04 NOTE — Progress Notes (Signed)
Patient was seen on 07/04/2023 for a post core session of Diabetes Prevention Program course at [facility]. By the end of this session patients are able to complete the following objectives:    Learning Objectives: Explain how glucose is used in the body and it's relationship with insulin/insulin resistance.  Identify symptoms of diabetes.  Describe lab tests used to diagnose diabetes.  Describe health complications and conditions related to diabetes.    Goals:  Record weight taken outside of class.  Track foods and beverages eaten each day in the "Food and Activity Tracker," including calories and fat grams for each item.   Track activity type, minutes you were active, and distance you reached each day in the "Food and Activity Tracker."    Follow-Up Plan: Attend session 19 in two weeks.  Bring completed "Food and Activity Trackers" to next session to be reviewed by Lifestyle Coach.

## 2023-08-06 ENCOUNTER — Other Ambulatory Visit: Payer: Self-pay | Admitting: Family Medicine

## 2023-08-15 ENCOUNTER — Encounter: Payer: Medicare Other | Attending: Internal Medicine | Admitting: Dietician

## 2023-08-15 ENCOUNTER — Encounter: Payer: Self-pay | Admitting: Dietician

## 2023-08-15 DIAGNOSIS — R7303 Prediabetes: Secondary | ICD-10-CM

## 2023-08-15 NOTE — Progress Notes (Signed)
Patient was seen on 08/15/2023 for the Diabetes Prevention Program course at [facility]. By the end of this session patients are able to complete the following objectives:   Learning Objectives: Describe the differences between unsaturated, saturated, and trans fat on heart health.  List dietary sources of unsaturated, saturated, and trans fats. Explain ways to reduce intake of saturated fat and replace them with heart healthy fats.  Goals:  Record weight taken outside of class.  Track foods and beverages eaten each day in the "Food and Activity Tracker," including calories and fat grams for each item.   Track activity type, minutes you were active, and distance you reached each day in the "Food and Activity Tracker."   Follow-Up Plan: Attend next session.  Bring completed "Food and Activity Trackers" to next session to be reviewed by Lifestyle Coach.

## 2023-09-02 ENCOUNTER — Telehealth: Payer: Self-pay | Admitting: Pharmacy Technician

## 2023-09-02 ENCOUNTER — Other Ambulatory Visit: Payer: Self-pay

## 2023-09-02 DIAGNOSIS — M810 Age-related osteoporosis without current pathological fracture: Secondary | ICD-10-CM | POA: Insufficient documentation

## 2023-09-02 NOTE — Telephone Encounter (Signed)
Dr. Sharl Ma,  Patient will be scheduled as soon as possible.  Auth Submission: NO AUTH NEEDED Site of care: Site of care: [facility] Payer: [employer] MEDICARE Medication & CPT/J Code(s) submitted: Reclast (Zolendronic acid) [id number] Route of submission (phone, fax, portal):  Phone # Fax # Auth type: Buy/Bill PB Units/visits requested: X1 Reference number:  Approval from: 09/02/23 to 11/25/23

## 2023-09-05 ENCOUNTER — Telehealth: Payer: Self-pay | Admitting: Family Medicine

## 2023-09-05 ENCOUNTER — Encounter: Payer: Self-pay | Admitting: Dietician

## 2023-09-05 ENCOUNTER — Encounter: Payer: Medicare Other | Attending: Internal Medicine | Admitting: Dietician

## 2023-09-05 DIAGNOSIS — R7303 Prediabetes: Secondary | ICD-10-CM | POA: Insufficient documentation

## 2023-09-05 NOTE — Telephone Encounter (Signed)
Andrey Campanile called wanting to confirm that PCP approved of patient's manufacturer for levothyroxine is okay to change from Amneal to Lupin. Andrey Campanile can be reached @ [phone] for confirmation.

## 2023-09-05 NOTE — Telephone Encounter (Signed)
Spoke with pharmacy and confirmed it was okay.

## 2023-09-05 NOTE — Progress Notes (Signed)
Patient was seen on 09/05/2023 for the Diabetes Prevention Program course at [facility]. By the end of this session patients are able to complete the following objectives:   Learning Objectives: Define fiber and describe the difference between insoluble and soluble fiber  List foods that are good sources of fiber Explain the health benefits of fiber  Describe ways to increase volume of meals and snacks while staying within fat goal.   Goals:  Record weight taken outside of class.  Track foods and beverages eaten each day in the "Food and Activity Tracker," including calories and fat grams for each item.   Track activity type, minutes you were active, and distance you reached each day in the "Food and Activity Tracker."   Follow-Up Plan: Attend next session.  Bring completed "Food and Activity Trackers" to next session to be reviewed by Lifestyle Coach.

## 2023-09-10 ENCOUNTER — Ambulatory Visit: Payer: Medicare Other

## 2023-10-29 ENCOUNTER — Other Ambulatory Visit: Payer: Self-pay | Admitting: Family Medicine

## 2023-11-07 ENCOUNTER — Encounter: Payer: Medicare Other | Attending: Internal Medicine | Admitting: Dietician

## 2023-11-07 ENCOUNTER — Encounter: Payer: Self-pay | Admitting: Dietician

## 2023-11-07 DIAGNOSIS — R7303 Prediabetes: Secondary | ICD-10-CM | POA: Insufficient documentation

## 2023-11-07 NOTE — Progress Notes (Signed)
Patient was seen on 11/07/2023 for the Diabetes Prevention Program course at [facility]. By the end of this session patients are able to complete the following objectives:   Learning Objectives: Reflect on lifestyle changes they have made since starting the DPP.  Set long-term goals to promote continued maintenance of lifestyle changes made during the program.   Goals:  Work toward reaching new long-term goals set during class.   Follow-Up Plan: Contact Lifestyle Coach with questions/concerns PRN.

## 2023-11-12 ENCOUNTER — Other Ambulatory Visit: Payer: Self-pay | Admitting: Family Medicine

## 2023-11-12 DIAGNOSIS — E890 Postprocedural hypothyroidism: Secondary | ICD-10-CM

## 2023-11-12 NOTE — Telephone Encounter (Signed)
Copied from CRM [id number]. Topic: Clinical - Medication Refill >> Nov 12, 2023 10:15 AM Efraim Kaufmann C wrote: Most Recent Primary Care Visit:  Provider: [facility]  Department: [facility]  Visit Type: LAB  Date: 07/02/2023  Medication: levothyroxine (SYNTHROID) 88 MCG tablet  Has the patient contacted their pharmacy? Yes (Agent: If no, request that the patient contact the pharmacy for the refill. If patient does not wish to contact the pharmacy document the reason why and proceed with request.) (Agent: If yes, when and what did the pharmacy advise?)  Is this the correct pharmacy for this prescription? Yes If no, delete pharmacy and type the correct one.  This is the patient's preferred pharmacy:  [facility]) - [place], [place] - [address] [facility] [address] [place] [address] [place] [address] Phone: [phone] Fax: [phone]    Has the prescription been filled recently? No  Is the patient out of the medication? No  Has the patient been seen for an appointment in the last year OR does the patient have an upcoming appointment? Yes  Can we respond through MyChart? Yes  Agent: Please be advised that Rx refills may take up to 3 business days. We ask that you follow-up with your pharmacy.

## 2023-12-24 ENCOUNTER — Encounter: Payer: Self-pay | Admitting: Family Medicine

## 2023-12-24 ENCOUNTER — Ambulatory Visit (INDEPENDENT_AMBULATORY_CARE_PROVIDER_SITE_OTHER): Payer: Medicare Other | Admitting: Family Medicine

## 2023-12-24 VITALS — BP 138/84 | HR 43 | Temp 97.5°F | Resp 18 | Ht 63.0 in | Wt 156.5 lb

## 2023-12-24 DIAGNOSIS — I10 Essential (primary) hypertension: Secondary | ICD-10-CM

## 2023-12-24 DIAGNOSIS — R0989 Other specified symptoms and signs involving the circulatory and respiratory systems: Secondary | ICD-10-CM

## 2023-12-24 DIAGNOSIS — D649 Anemia, unspecified: Secondary | ICD-10-CM | POA: Diagnosis not present

## 2023-12-24 DIAGNOSIS — E7800 Pure hypercholesterolemia, unspecified: Secondary | ICD-10-CM | POA: Diagnosis not present

## 2023-12-24 DIAGNOSIS — E039 Hypothyroidism, unspecified: Secondary | ICD-10-CM

## 2023-12-24 DIAGNOSIS — R7303 Prediabetes: Secondary | ICD-10-CM | POA: Diagnosis not present

## 2023-12-24 LAB — COMPREHENSIVE METABOLIC PANEL
ALT: 15 U/L (ref 0–35)
AST: 18 U/L (ref 0–37)
Albumin: 4.2 g/dL (ref 3.5–5.2)
Alkaline Phosphatase: 68 U/L (ref 39–117)
BUN: 11 mg/dL (ref 6–23)
CO2: 33 meq/L — ABNORMAL HIGH (ref 19–32)
Calcium: 9.4 mg/dL (ref 8.4–10.5)
Chloride: 103 meq/L (ref 96–112)
Creatinine, Ser: 0.61 mg/dL (ref 0.40–1.20)
GFR: 86.61 mL/min (ref >60.00)
Glucose, Bld: 85 mg/dL (ref 70–99)
Potassium: 4.9 meq/L (ref 3.5–5.1)
Sodium: 141 meq/L (ref 135–145)
Total Bilirubin: 0.6 mg/dL (ref 0.2–1.2)
Total Protein: 6.7 g/dL (ref 6.0–8.3)

## 2023-12-24 LAB — CBC WITH DIFFERENTIAL/PLATELET
Basophils Absolute: 0 10*3/uL (ref 0.0–0.1)
Basophils Relative: 0.4 % (ref 0.0–3.0)
Eosinophils Absolute: 0.1 10*3/uL (ref 0.0–0.7)
Eosinophils Relative: 1 % (ref 0.0–5.0)
HCT: 39.1 % (ref 36.0–46.0)
Hemoglobin: 13.1 g/dL (ref 12.0–15.0)
Lymphocytes Relative: 36 % (ref 12.0–46.0)
Lymphs Abs: 2.5 10*3/uL (ref 0.7–4.0)
MCHC: 33.5 g/dL (ref 30.0–36.0)
MCV: 92.6 fL (ref 78.0–100.0)
Monocytes Absolute: 0.6 10*3/uL (ref 0.1–1.0)
Monocytes Relative: 8 % (ref 3.0–12.0)
Neutro Abs: 3.8 10*3/uL (ref 1.4–7.7)
Neutrophils Relative %: 54.6 % (ref 43.0–77.0)
Platelets: 265 10*3/uL (ref 150.0–400.0)
RBC: 4.22 Mil/uL (ref 3.87–5.11)
RDW: 12.8 % (ref 11.5–15.5)
WBC: 7 10*3/uL (ref 4.0–10.5)

## 2023-12-24 LAB — IBC + FERRITIN
Ferritin: 154.9 ng/mL (ref 10.0–291.0)
Iron: 108 ug/dL (ref 42–145)
Saturation Ratios: 33.7 % (ref 20.0–50.0)
TIBC: 320.6 ug/dL (ref 250.0–450.0)
Transferrin: 229 mg/dL (ref 212.0–360.0)

## 2023-12-24 LAB — TSH: TSH: 0.31 u[IU]/mL — ABNORMAL LOW (ref 0.35–5.50)

## 2023-12-24 LAB — T4, FREE: Free T4: 1.04 ng/dL (ref 0.60–1.60)

## 2023-12-24 LAB — HEMOGLOBIN A1C: Hgb A1c MFr Bld: 5.8 % (ref 4.6–6.5)

## 2023-12-24 LAB — VITAMIN B12: Vitamin B-12: 451 pg/mL (ref 211–911)

## 2023-12-24 LAB — T3, FREE: T3, Free: 2.6 pg/mL (ref 2.3–4.2)

## 2023-12-24 LAB — LIPID PANEL
Cholesterol: 267 mg/dL — ABNORMAL HIGH (ref 0–200)
HDL: 51.7 mg/dL (ref >39.00)
LDL Cholesterol: 180 mg/dL — ABNORMAL HIGH (ref 0–99)
NonHDL: 215.33
Total CHOL/HDL Ratio: 5
Triglycerides: 179 mg/dL — ABNORMAL HIGH (ref 0.0–149.0)
VLDL: 35.8 mg/dL (ref 0.0–40.0)

## 2023-12-24 MED ORDER — GABAPENTIN 300 MG PO CAPS: 300.0000 mg | ORAL_CAPSULE | Freq: Three times a day (TID) | ORAL | 3 refills | Status: DC

## 2023-12-24 MED ORDER — AMLODIPINE BESYLATE 5 MG PO TABS: 5.0000 mg | ORAL_TABLET | Freq: Every day | ORAL | 3 refills | Status: DC

## 2023-12-24 NOTE — Progress Notes (Signed)
Subjective:     Patient ID: [name] [name], female    DOB: 1946-04-22, 78 y.o.   MRN: [id number]  Chief Complaint  Patient presents with   Medical Management of Chronic Issues    6 month follow-up on predm, htn Not fasting     HPI  HTN - Pt is on Amlodipine. 5mg  and back on irbesartan 300mg .  Running 150's in the am and dbp 59-80. No ha/dizziness/cp/palp/cough/sob.  Neck pain - She complains of recently neck pain. Believes having a rod in her neck along with her osteoporosis could be worsening the neck pain.    Mid back pain - She reports mid back pain when doing activities when moving her arms or using her hands, such as doing the dishes. States pain is greater across her bra line. No abd pain.   LE swelling - Worsens when not eating well with high sodium or when she is on her feet for long periods. Has not tried compression socks. Swelling did not worsen when she stopped irbesartan.   HLD - not taking rosuvastatin d/t pain.   PreDM -. Has seen a nutritionist- last year.   Osteoporosis - Prolia caused a lot of pain  Follows up with Dr. Sharl Ma from endocrinology  Hypothyroidism-on synthroid 88 - hair thinning Can feel and hear R carotid pulsations Discussed the use of AI scribe software for clinical note transcription with the patient, who gave verbal consent to proceed.  History of Present Illness   The patient presents with concerns about hair thinning and blood pressure management.  They have been experiencing significant hair thinning since before Christmas. There have been no recent procedures or dietary changes, although they acknowledge not maintaining a healthy diet. They are concerned about a potential thyroid issue related to the hair thinning.  They have a history of hypertension and have been struggling with blood pressure management. After stopping irbesartan, they experienced low blood pressure, with diastolic readings sometimes below 40. Upon resuming  irbesartan at 300 mg, their blood pressure has been inconsistent, with systolic pressures in the 150s in the morning and lower in the afternoon. Diastolic pressure ranges from 59 to over 80. No dizziness, lightheadedness, chest pain, heart racing, coughing, or shortness of breath. They currently take amlodipine and irbesartan in the morning.  They experience neck and back pain, managed with gabapentin. The pain is tolerable with medication, but excessive gabapentin causes sleepiness. They report pain when lifting their leg to get into a car, described as ligament pain, which is not constant and does not occur during normal walking. They have a history of hip replacement surgery and have not consulted a specialist for their back in years.  They are not currently taking rosuvastatin, as advised by another doctor, and have stopped Prolia due to severe pain. They are attempting to manage their condition naturally.  They mention a supportive family environment, with one daughter recently moving closer and another daughter who checks in regularly. They are socially active, participating in group activities and maintaining a network of friends.       Health Maintenance Due  Topic Date Due   Medicare Annual Wellness (AWV)  02/27/2023    Past Medical History:  Diagnosis Date   Abnormal Pap smear    Abnormal peripheral vision    RT EYE   Allergy    Anemia    Arthritis    back and neck   Breast cancer (HCC) 1997   right breast   Bruises  easily    Cataracts, bilateral    Chronic back pain    Chronic neck pain    Complication of anesthesia    slow to wake up   Family history of adverse reaction to anesthesia    Dad takes a long time to wake up   Frequency of urination    GERD (gastroesophageal reflux disease)    Glaucoma    Goiter    High cholesterol    History of postoperative nausea and vomiting    Hypertension    Hypothyroidism    Neuromuscular disorder (HCC)    Numbness    hands /  feet /arms / legs - [place] by Dr. Sol Passer [place]   Osteoporosis    PONV (postoperative nausea and vomiting)    Post-menopausal    Prediabetes    Sexual dysfunction    Shortness of breath    with exertion and Wt gain   Urinary incontinence     Past Surgical History:  Procedure Laterality Date   BIOPSY THYROID     BREAST SURGERY Right    lymph node removed   CATARACT EXTRACTION, BILATERAL  2009,2010   COLONOSCOPY  02/15/2014   Dr.Stark   EYE SURGERY  08/2012   detached retina surg   LAPAROSCOPY  1978   BTL   lumpectomy  1997   Rt   neck fusion  2010   THYROIDECTOMY N/A 05/04/2013   Procedure:  TOTAL THYROIDECTOMY;  Surgeon: Velora Heckler, MD;  Location: [facility];  Service: General;  Laterality: N/A;   TOTAL HIP ARTHROPLASTY Right 01/02/2021   Procedure: TOTAL HIP ARTHROPLASTY ANTERIOR APPROACH;  Surgeon: Durene Romans, MD;  Location: [facility];  Service: Orthopedics;  Laterality: Right;  70 mins   TUBAL LIGATION       Current Outpatient Medications:    Biotin w/ Vitamins C & E (HAIR/SKIN/NAILS PO), Take by mouth., Disp: , Rfl:    dorzolamide-timolol (COSOPT) 22.3-6.8 MG/ML ophthalmic solution, Place 1 drop into both eyes 2 (two) times daily. , Disp: , Rfl:    irbesartan (AVAPRO) 300 MG tablet, TAKE 1 TABLET BY MOUTH DAILY, Disp: 90 tablet, Rfl: 3   latanoprost (XALATAN) 0.005 % ophthalmic solution, Place 1 drop into the left eye at bedtime., Disp: , Rfl:    levothyroxine (SYNTHROID) 88 MCG tablet, TAKE 1 TABLET BY MOUTH DAILY, Disp: 90 tablet, Rfl: 3   Multiple Vitamin (MULTIVITAMIN ADULT) TABS, Take 1 tablet by mouth daily., Disp: , Rfl:    polyvinyl alcohol (LIQUIFILM TEARS) 1.4 % ophthalmic solution, Place 1 drop into both eyes daily as needed (for dry eyes)., Disp: , Rfl:    rosuvastatin (CRESTOR) 5 MG tablet, TAKE 1 TABLET BY MOUTH TWICE  WEEKLY, Disp: 26 tablet, Rfl: 3   VITAMIN D PO, Take by mouth., Disp: , Rfl:    amLODipine (NORVASC) 5 MG tablet, Take 1 tablet (5  mg total) by mouth daily., Disp: 90 tablet, Rfl: 3   gabapentin (NEURONTIN) 300 MG capsule, Take 1 capsule (300 mg total) by mouth 3 (three) times daily., Disp: 270 capsule, Rfl: 3  Allergies  Allergen Reactions   Erythromycin Nausea Only and Nausea And Vomiting    Other reaction(s): Unknown   Ezetimibe     Other reaction(s): myalgias   Latex Other (See Comments)   Other Itching    Unknown Dye PET scan dye possibly= itching   Statins     Other reaction(s): myalgias   ROS neg/noncontributory except as noted HPI/below  Objective:     BP 138/84 (BP Location: Left Arm, Patient Position: Sitting, Cuff Size: Normal)   Pulse (!) 43   Temp (!) 97.5 F (36.4 C) (Temporal)   Resp 18   Ht 5\' 3"  (1.6 m)   Wt 156 lb 8 oz (71 kg)   SpO2 99%   BMI 27.72 kg/m  Wt Readings from Last 3 Encounters:  12/24/23 156 lb 8 oz (71 kg)  06/18/23 155 lb (70.3 kg)  02/07/23 161 lb 12.8 oz (73.4 kg)    Physical Exam   Gen: [facility] NAD HEENT: NCAT, conjunctiva not injected, sclera nonicteric NECK:  supple, no nodes, mild R carotid bruits. Thyroid absent.  CARDIAC: +Bradycardia, RRR, S1S2+, DP 2+B. +1/6 murmur. LUNGS: CTAB. No wheezes ABDOMEN:  BS+, soft, NTND, No HSM, no masses EXT:  no edema MSK: no gross abnormalities. MS 5/5 BUE NEURO: A&O x3.  CN II-XII intact.  PSYCH: normal mood. Good eye contact      Assessment & Plan:  Essential hypertension, benign -     amLODIPine Besylate; Take 1 tablet (5 mg total) by mouth daily.  Dispense: 90 tablet; Refill: 3  Prediabetes -     Hemoglobin A1c  High cholesterol -     Comprehensive metabolic panel -     Lipid panel  Anemia, unspecified type -     CBC with Differential/Platelet -     Vitamin B12 -     IBC + Ferritin  Acquired hypothyroidism -     TSH -     T3, free -     T4, free  Bruit of right carotid artery -     US Carotid Bilateral; Future  Other orders -     Gabapentin; Take 1 capsule (300 mg total) by mouth 3 (three)  times daily.  Dispense: 270 capsule; Refill: 3  Assessment and Plan    Alopecia Progressive hair thinning since before Christmas may be due to thyroid dysfunction or nutritional deficiency. Blood work will check thyroid function and iron levels to identify the cause and guide treatment. If results are normal, consider a referral to a dermatologist.  Hypertension Blood pressure readings are inconsistent, with systolic in the 150s in the morning and lower in the afternoon, and diastolic ranging from 59 to 80. Currently taking 300 mg of irbesartan and amlodipine. No symptoms of dizziness, lightheadedness, chest pain, palpitations, cough, or dyspnea. Switch amlodipine to nighttime dosing to stabilize blood pressure and avoid additional medications. Monitor blood pressure readings and report back.  Post-Hip Replacement Pain Experiencing groin pain when lifting the leg, especially when getting into the car, possibly due to ligament pain or an issue with the hip replacement cap. An orthopedic evaluation at [facility] is needed to determine the cause and appropriate treatment.  Chronic Pain (Neck and Back) Chronic neck and back pain is managed with gabapentin. The pain is tolerable but persistent. Increasing the gabapentin dosage may cause excessive drowsiness, so continue with the current dosage to avoid side effects.  Osteoporosis Previously on Prolia but discontinued due to severe pain. Prefers to manage osteoporosis with natural methods and stay active due to severe side effects from Prolia. Encourage staying active and exploring natural methods for osteoporosis management.  Hyperlipidemia Previously on rosuvastatin but discontinued due to side effects. Current lipid levels are unknown. Monitor lipid levels and reassess the need for statin therapy.  General Health Maintenance Discussed the importance of hydration and reducing caffeine intake. Encourage using flavored water if needed  to increase  intake and set reminders to ensure adequate water consumption.  Follow-up Call GI for a colonoscopy appointment, schedule a Doppler ultrasound at [facility], follow up with blood work results, and print the after-visit summary.      R carotid bruit-check dopplers   Return in 6 months (on 06/22/2024) for chronic follow-up.     Angelena Sole, MD

## 2023-12-24 NOTE — Patient Instructions (Addendum)
It was very nice to see you today!  Try changing amlodipine to night and see if bp better in morning  [phone]  Call dermatology   PLEASE NOTE:  If you had any lab tests please let us know if you have not heard back within a few days. You may see your results on MyChart before we have a chance to review them but we will give you a call once they are reviewed by [place]. If we ordered any referrals today, please let us know if you have not heard from their office within the next week.   Please try these tips to maintain a healthy lifestyle:  Eat most of your calories during the day when you are active. Eliminate processed foods including packaged sweets (pies, cakes, cookies), reduce intake of potatoes, white bread, white pasta, and white rice. Look for whole grain options, oat flour or almond flour.  Each meal should contain half fruits/vegetables, one quarter protein, and one quarter carbs (no bigger than a computer mouse).  Cut down on sweet beverages. This includes juice, soda, and sweet tea. Also watch fruit intake, though this is a healthier sweet option, it still contains natural sugar! Limit to 3 servings daily.  Drink at least 1 glass of water with each meal and aim for at least 8 glasses per day  Exercise at least 150 minutes every week.

## 2023-12-24 NOTE — Progress Notes (Signed)
1.  Thyroid dose a little too much.  Does she want to decrease to 0.075 or stay on 0.088 daily but 2 days/wk only take 1/2 tablet rather than whole? 2.  A1C(3 month average of sugars) is elevated.  This is considered PreDiabetes.  Work on diet-decrease sugars and starches and aim for 30 minutes of exercise 5 days/week to prevent progression to diabetes  3.  Cholesterol too high-does she want to try the injectable repatha med(not a statin)?

## 2023-12-25 ENCOUNTER — Other Ambulatory Visit: Payer: Self-pay | Admitting: *Deleted

## 2023-12-25 DIAGNOSIS — R946 Abnormal results of thyroid function studies: Secondary | ICD-10-CM

## 2023-12-25 MED ORDER — LEVOTHYROXINE SODIUM 75 MCG PO TABS: 75.0000 ug | ORAL_TABLET | Freq: Every day | ORAL | 1 refills | Status: DC

## 2023-12-25 NOTE — Telephone Encounter (Signed)
Please see patient response and advise

## 2024-01-05 ENCOUNTER — Ambulatory Visit (HOSPITAL_BASED_OUTPATIENT_CLINIC_OR_DEPARTMENT_OTHER)
Admission: RE | Admit: 2024-01-05 | Discharge: 2024-01-05 | Disposition: A | Discharge status: Home / Self Care | Payer: Medicare Other | Source: Ambulatory Visit | Attending: Family Medicine | Admitting: Family Medicine

## 2024-01-05 DIAGNOSIS — R0989 Other specified symptoms and signs involving the circulatory and respiratory systems: Secondary | ICD-10-CM | POA: Insufficient documentation

## 2024-01-07 ENCOUNTER — Encounter: Payer: Self-pay | Admitting: Family Medicine

## 2024-02-11 ENCOUNTER — Telehealth: Payer: Self-pay | Admitting: Family Medicine

## 2024-02-11 ENCOUNTER — Telehealth: Payer: Self-pay | Admitting: Gastroenterology

## 2024-02-11 ENCOUNTER — Other Ambulatory Visit: Payer: Self-pay | Admitting: *Deleted

## 2024-02-11 ENCOUNTER — Telehealth: Payer: Self-pay | Admitting: *Deleted

## 2024-02-11 ENCOUNTER — Encounter: Payer: Self-pay | Admitting: Gastroenterology

## 2024-02-11 DIAGNOSIS — Z1211 Encounter for screening for malignant neoplasm of colon: Secondary | ICD-10-CM

## 2024-02-11 NOTE — Telephone Encounter (Signed)
 Patient is returning a call fro the office I scheduled her a lab appt for April 7 she wants to know if she needs to be fasting are not she also wants to know if she need's a referral to get a colonoscopy done

## 2024-02-11 NOTE — Telephone Encounter (Signed)
 Patient informed that she does not have to be fasting, yes, we will do the referral for the colonoscopy and no specific provider in the office. Patient verbalized understanding.

## 2024-02-11 NOTE — Telephone Encounter (Signed)
**Note De-identified  [place] Obfuscation** Please advise 

## 2024-02-11 NOTE — Telephone Encounter (Unsigned)
 Copied from CRM [id number]. Topic: Referral - Question >> Feb 11, 2024  9:55 AM Drema Balzarine wrote: Reason for CRM: Patient previous Gastroenterologist at [facility] left the practice and would like to know if there's a specific provider at that office that Dr. Ruthine Dose recommends

## 2024-02-11 NOTE — Addendum Note (Signed)
 Addended by: Jobe Gibbon on: 02/11/2024 11:27 AM   Modules accepted: Orders

## 2024-02-11 NOTE — Telephone Encounter (Signed)
 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  Please schedule next available appointment for colonoscopy, can be direct if patient has no symptoms and meets the criteria.  Thank you

## 2024-02-11 NOTE — Telephone Encounter (Signed)
 Good morning Dr. Lavon Paganini,   I received a call from this patient want to schedule for a colonoscopy. Patient last seen by Dr. Russella Dar. Would you please advise on scheduling this patient.  Thank you.

## 2024-02-11 NOTE — Telephone Encounter (Signed)
 Copied from CRM [id number]. Topic: Clinical - Request for Lab/Test Order >> Feb 11, 2024  9:53 AM Drema Balzarine wrote: Reason for CRM: Patient says Dr. Ruthine Dose wanted her to come in for more labs in April, wants to make sure that current future lab orders in chart are okay before scheduling. Please call.

## 2024-03-01 ENCOUNTER — Encounter: Payer: Self-pay | Admitting: Family Medicine

## 2024-03-01 ENCOUNTER — Other Ambulatory Visit (INDEPENDENT_AMBULATORY_CARE_PROVIDER_SITE_OTHER)

## 2024-03-01 DIAGNOSIS — R946 Abnormal results of thyroid function studies: Secondary | ICD-10-CM

## 2024-03-01 LAB — T4, FREE: Free T4: 0.85 ng/dL (ref 0.60–1.60)

## 2024-03-01 LAB — TSH: TSH: 4.42 u[IU]/mL (ref 0.35–5.50)

## 2024-03-01 NOTE — Progress Notes (Signed)
 Now thyroid dose a little too low.  Contine 0.075mg  daily, but 2 days/week, take 1.5 tabs.  I know it's hard, but the 0.088 was too much.

## 2024-03-02 ENCOUNTER — Encounter: Payer: Self-pay | Admitting: Family Medicine

## 2024-03-03 ENCOUNTER — Other Ambulatory Visit: Payer: Self-pay | Admitting: *Deleted

## 2024-03-03 DIAGNOSIS — R946 Abnormal results of thyroid function studies: Secondary | ICD-10-CM

## 2024-03-03 MED ORDER — LEVOTHYROXINE SODIUM 75 MCG PO TABS
ORAL_TABLET | ORAL | 1 refills | Status: DC
Start: 2024-03-03 — End: 2024-07-12

## 2024-03-09 DIAGNOSIS — E890 Postprocedural hypothyroidism: Secondary | ICD-10-CM | POA: Insufficient documentation

## 2024-03-09 DIAGNOSIS — R7301 Impaired fasting glucose: Secondary | ICD-10-CM | POA: Insufficient documentation

## 2024-03-09 DIAGNOSIS — M810 Age-related osteoporosis without current pathological fracture: Secondary | ICD-10-CM | POA: Insufficient documentation

## 2024-03-09 DIAGNOSIS — I10 Essential (primary) hypertension: Secondary | ICD-10-CM | POA: Insufficient documentation

## 2024-03-22 ENCOUNTER — Ambulatory Visit (AMBULATORY_SURGERY_CENTER)

## 2024-03-22 VITALS — Ht 63.0 in | Wt 155.0 lb

## 2024-03-22 DIAGNOSIS — Z860100 Personal history of colon polyps, unspecified: Secondary | ICD-10-CM

## 2024-03-22 DIAGNOSIS — Z800 Family history of malignant neoplasm of digestive organs: Secondary | ICD-10-CM

## 2024-03-22 MED ORDER — NA SULFATE-K SULFATE-MG SULF 17.5-3.13-1.6 GM/177ML PO SOLN: 1.0000 | Freq: Once | ORAL | 0 refills | Status: AC

## 2024-03-22 NOTE — Progress Notes (Signed)
No egg or soy allergy known to patient  No issues known to pt with past sedation with any surgeries or procedures Patient denies ever being told they had issues or difficulty with intubation  No FH of Malignant Hyperthermia Pt is not on diet pills Pt is not on  home 02  Pt is not on blood thinners  Pt has issues with constipation  No A fib or A flutter Have any cardiac testing pending--no Pt can ambulate independently Pt denies use of chewing tobacco Discussed diabetic I weight loss medication holds Discussed NSAID holds Checked BMI Pt instructed to use [employer] or GoodRx for a price reduction on prep  Patient's chart reviewed by Cathlyn Parsons CNRA prior to previsit and patient appropriate for the [facility].  Pre visit completed and red dot placed by patient's name on their procedure day (on provider's schedule).

## 2024-03-24 ENCOUNTER — Other Ambulatory Visit: Payer: Self-pay

## 2024-03-24 ENCOUNTER — Telehealth: Payer: Self-pay

## 2024-03-24 ENCOUNTER — Telehealth: Payer: Self-pay | Admitting: Gastroenterology

## 2024-03-24 DIAGNOSIS — Z800 Family history of malignant neoplasm of digestive organs: Secondary | ICD-10-CM

## 2024-03-24 MED ORDER — NA SULFATE-K SULFATE-MG SULF 17.5-3.13-1.6 GM/177ML PO SOLN: 1.0000 | Freq: Once | ORAL | 0 refills | Status: AC

## 2024-03-24 NOTE — Telephone Encounter (Signed)
 Inbound call from patient requesting prep medication be sent to [facility] on  [address]. Patient is scheduled for colonoscopy on 5/14. Please advise.

## 2024-03-24 NOTE — Telephone Encounter (Signed)
 RN contacted patient to confirm that her prep medication was sent to her preferred pharmacy. Patient stated understanding.

## 2024-03-31 ENCOUNTER — Encounter (HOSPITAL_COMMUNITY): Payer: Self-pay

## 2024-04-02 LAB — HM MAMMOGRAPHY

## 2024-04-05 ENCOUNTER — Encounter: Payer: Self-pay | Admitting: Family Medicine

## 2024-04-05 ENCOUNTER — Ambulatory Visit: Payer: Self-pay

## 2024-04-05 NOTE — Telephone Encounter (Signed)
 FYI on pt triage note

## 2024-04-05 NOTE — Telephone Encounter (Signed)
 Copied from CRM [id number]. Topic: Clinical - Red Word Triage >> Apr 05, 2024 12:54 PM Kita Perish H wrote: [facility] that prompted transfer to Nurse Triage: Shelf fell in kitchen and coffee cups hit her in the head, wants to know if that will affect her going under anesthesia   Chief Complaint: Head Injury  Symptoms: Tenderness  Frequency: Acute  Pertinent Negatives: Patient denies any neurological changes Disposition: [] ED /[] Urgent Care (no appt availability in office) / [] Appointment(In office/virtual)/ []  [facility]/ [x] Home Care/ [] Refused Recommended Disposition /[] [facility]/ [x]  Follow-up with PCP Additional Notes: CD is being triaged for a potential head injury after 1-2 ceramic coffee cups fell on her head.  The patient does not have any apparent injuries or pain  Additional Information . Commented on: All Negative - Home Care    Follow Up With PCP, Notify Anesthesia Provider on the day of the procedure.  Answer Assessment - Initial Assessment Questions 1. MECHANISM: "How did the injury happen?" For falls, ask: "What height did you fall from?" and "What surface did you fall against?"      Shelf with coffee cups fell on her head  2. ONSET: "When did the injury happen?" (Minutes or hours ago)      Moments  3. NEUROLOGIC SYMPTOMS: "Was there any loss of consciousness?" "Are there any other neurological symptoms?"      No symptoms  4. MENTAL STATUS: "Does the person know who they are, who you are, and where they are?"      Yes  5. LOCATION: "What part of the head was hit?"      Top  6. SCALP APPEARANCE: "What does the scalp look like? Is it bleeding now?" If Yes, ask: "Is it difficult to stop?"      No  7. SIZE: For cuts, bruises, or swelling, ask: "How large is it?" (e.g., inches or centimeters)      No  8. PAIN: "Is there any pain?" If Yes, ask: "How bad is it?"  (e.g., Scale 1-10; or mild, moderate, severe)     Tender to touch   10. OTHER  SYMPTOMS: "Do you have any other symptoms?" (e.g., neck pain, vomiting)     Denies any other symptoms  Protocols used: Head Injury-A-AH

## 2024-04-05 NOTE — Telephone Encounter (Signed)
 Pt states shelf in kitchen full of coffee cups fell on the top of her head at 9 am this am .She wanted to know if it will affect anesthesia.  Pt denise any cuts, loss of consciences ,visual disturbances or changes in gait . Pt through conversation is A&O x 3.  Instructed pt  to call her PCP and discuss her concerning the fall. RN instructed pt to go to ER or call 911 if any symptoms develop at amy time she is concerned about.

## 2024-04-05 NOTE — Telephone Encounter (Signed)
 Inbound call from patient states she has hit her head. Requesting to speak with a nurse. Please advise.   Thank you

## 2024-04-07 ENCOUNTER — Encounter: Payer: Self-pay | Admitting: Gastroenterology

## 2024-04-07 ENCOUNTER — Ambulatory Visit: Admitting: Gastroenterology

## 2024-04-07 VITALS — BP 134/63 | HR 47 | Temp 98.2°F | Resp 16 | Ht 63.0 in | Wt 155.0 lb

## 2024-04-07 DIAGNOSIS — K635 Polyp of colon: Secondary | ICD-10-CM

## 2024-04-07 DIAGNOSIS — Z1211 Encounter for screening for malignant neoplasm of colon: Secondary | ICD-10-CM | POA: Diagnosis not present

## 2024-04-07 DIAGNOSIS — D122 Benign neoplasm of ascending colon: Secondary | ICD-10-CM | POA: Diagnosis not present

## 2024-04-07 DIAGNOSIS — K648 Other hemorrhoids: Secondary | ICD-10-CM

## 2024-04-07 DIAGNOSIS — K5730 Diverticulosis of large intestine without perforation or abscess without bleeding: Secondary | ICD-10-CM

## 2024-04-07 DIAGNOSIS — Z800 Family history of malignant neoplasm of digestive organs: Secondary | ICD-10-CM

## 2024-04-07 DIAGNOSIS — Z860100 Personal history of colon polyps, unspecified: Secondary | ICD-10-CM

## 2024-04-07 DIAGNOSIS — K644 Residual hemorrhoidal skin tags: Secondary | ICD-10-CM

## 2024-04-07 MED ORDER — SODIUM CHLORIDE 0.9 % IV SOLN: 500.0000 mL | Freq: Once | INTRAVENOUS | Status: DC

## 2024-04-07 NOTE — Patient Instructions (Addendum)
 - Resume previous diet.                           - Continue present medications.                           - Await pathology results.                           - Repeat colonoscopy in 1 year for surveillance                            based on pathology results.                           - No NSAIDS (aspirin , advil , motrin , aleve) for 2 weeks.  YOU HAD AN ENDOSCOPIC PROCEDURE TODAY AT THE [facility]:   Refer to the procedure report that was given to you for any specific questions about what was found during the examination.  If the procedure report does not answer your questions, please call your gastroenterologist to clarify.  If you requested that your care partner not be given the details of your procedure findings, then the procedure report has been included in a sealed envelope for you to review at your convenience later.  YOU SHOULD EXPECT: Some feelings of bloating in the abdomen. Passage of more gas than usual.  Walking can help get rid of the air that was put into your GI tract during the procedure and reduce the bloating. If you had a lower endoscopy (such as a colonoscopy or flexible sigmoidoscopy) you may notice spotting of blood in your stool or on the toilet paper. If you underwent a bowel prep for your procedure, you may not have a normal bowel movement for a few days.  Please Note:  You might notice some irritation and congestion in your nose or some drainage.  This is from the oxygen used during your procedure.  There is no need for concern and it should clear up in a day or so.  SYMPTOMS TO REPORT IMMEDIATELY:  Following lower endoscopy (colonoscopy or flexible sigmoidoscopy):  Excessive amounts of blood in the stool  Significant tenderness or worsening of abdominal pains  Swelling of the abdomen that is new, acute  Fever of 100F or higher   For urgent or emergent issues, a gastroenterologist can be reached at any hour by calling [phone]  [phone]. Do not use MyChart messaging for urgent concerns.    DIET:  We do recommend a small meal at first, but then you may proceed to your regular diet.  Drink plenty of fluids but you should avoid alcoholic beverages for 24 hours.  ACTIVITY:  You should plan to take it easy for the rest of today and you should NOT DRIVE or use heavy machinery until tomorrow (because of the sedation medicines used during the test).    FOLLOW UP: Our staff will call the number listed on your records the next business day following your procedure.  We will call around 7:15- 8:00 am to check on you and address any questions or concerns that you may have regarding the information given to you following your procedure. If we do not reach you, we will leave a message.     If any biopsies were taken  you will be contacted by phone or by letter within the next 1-3 weeks.  Please call us  at [phone] if you have not heard about the biopsies in 3 weeks.    SIGNATURES/CONFIDENTIALITY: You and/or your care partner have signed paperwork which will be entered into your electronic medical record.  These signatures attest to the fact that that the information above on your After Visit Summary has been reviewed and is understood.  Full responsibility of the confidentiality of this discharge information lies with you and/or your care-partner.

## 2024-04-07 NOTE — Progress Notes (Signed)
 Called to room to assist during endoscopic procedure.  Patient ID and intended procedure confirmed with present staff. Received instructions for my participation in the procedure from the performing physician.

## 2024-04-07 NOTE — Progress Notes (Signed)
 [facility] History and Physical   Primary Care Physician:  Christel Cousins, MD   Reason for Procedure:  History of adenomatous colon polyps, family h/o colon cancer  Plan:    Surveillance colonoscopy with possible interventions as needed     HPI: [name] [name] is a very pleasant 78 y.o. female here for surveillance colonoscopy. Denies any nausea, vomiting, abdominal pain, melena or bright red blood per rectum  The risks and benefits as well as alternatives of endoscopic procedure(s) have been discussed and reviewed. All questions answered. The patient agrees to proceed.    Past Medical History:  Diagnosis Date   Abnormal Pap smear    Abnormal peripheral vision    RT EYE   Allergy    Anemia    Arthritis    back and neck   Breast cancer (HCC) 1997   right breast   Bruises easily    Cataracts, bilateral    Chronic back pain    Chronic neck pain    Complication of anesthesia    slow to wake up   Family history of adverse reaction to anesthesia    Dad takes a long time to wake up   Frequency of urination    GERD (gastroesophageal reflux disease)    Glaucoma    Goiter    High cholesterol    History of postoperative nausea and vomiting    Hypertension    Hypothyroidism    Neuromuscular disorder (HCC)    Numbness    hands / feet /arms / legs - followed by Dr. Veverly Grace - [place]   Osteoporosis    PONV (postoperative nausea and vomiting)    Post-menopausal    Prediabetes    Sexual dysfunction    Shortness of breath    with exertion and Wt gain   Urinary incontinence     Past Surgical History:  Procedure Laterality Date   BIOPSY THYROID      BREAST SURGERY Right    lymph node removed   CATARACT EXTRACTION, BILATERAL  2009,2010   COLONOSCOPY  02/15/2014   Dr.Stark   EYE SURGERY  08/2012   detached retina surg   LAPAROSCOPY  1978   BTL   lumpectomy  1997   Rt   neck fusion  2010   THYROIDECTOMY N/A 05/04/2013   Procedure:  TOTAL  THYROIDECTOMY;  Surgeon: Keitha Pata, MD;  Location: [facility];  Service: General;  Laterality: N/A;   TOTAL HIP ARTHROPLASTY Right 01/02/2021   Procedure: TOTAL HIP ARTHROPLASTY ANTERIOR APPROACH;  Surgeon: Claiborne Crew, MD;  Location: [facility];  Service: Orthopedics;  Laterality: Right;  70 mins   TUBAL LIGATION      Prior to Admission medications   Medication Sig Start Date End Date Taking? Authorizing Provider  amLODipine  (NORVASC ) 5 MG tablet Take 1 tablet (5 mg total) by mouth daily. 12/24/23  Yes Christel Cousins, MD  Biotin w/ Vitamins C & E (HAIR/SKIN/NAILS PO) Take by mouth.   Yes [provider]  Calcium -Magnesium -Zinc-Vit D3 (CALCIUM -MAGNESIUM -ZINC-D3 PO) Take by mouth.   Yes [provider]  co-enzyme Q-10 30 MG capsule Take 30 mg by mouth 3 (three) times daily.   Yes [provider]  dorzolamide  (TRUSOPT ) 2 % ophthalmic solution 1 drop into affected eye Ophthalmic twice a day   Yes [provider]  dorzolamide -timolol  (COSOPT ) 22.3-6.8 MG/ML ophthalmic solution Place 1 drop into both eyes 2 (two) times daily.    Yes [provider]  gabapentin  (  NEURONTIN ) 300 MG capsule Take 1 capsule (300 mg total) by mouth 3 (three) times daily. 12/24/23  Yes Christel Cousins, MD  irbesartan  (AVAPRO ) 300 MG tablet TAKE 1 TABLET BY MOUTH DAILY 10/30/23  Yes Christel Cousins, MD  latanoprost  (XALATAN ) 0.005 % ophthalmic solution Place 1 drop into the left eye at bedtime. 05/04/20  Yes [provider]  levothyroxine  (SYNTHROID ) 75 MCG tablet Take 1 tablet (75 mg) five days a week and 1.5 tablets 2 days a week 03/03/24  Yes Christel Cousins, MD  Multiple Vitamin (MULTIVITAMIN ADULT) TABS Take 1 tablet by mouth daily.   Yes [provider]  rosuvastatin  (CRESTOR ) 5 MG tablet TAKE 1 TABLET BY MOUTH TWICE  WEEKLY 08/07/23  Yes Christel Cousins, MD  VITAMIN D  PO Take by mouth.   Yes [provider]  Ferrous Gluconate (IRON 27 PO) Take by  mouth.    [provider]  polyvinyl alcohol (LIQUIFILM TEARS) 1.4 % ophthalmic solution Place 1 drop into both eyes daily as needed (for dry eyes).    [provider]    Current Outpatient Medications  Medication Sig Dispense Refill   amLODipine  (NORVASC ) 5 MG tablet Take 1 tablet (5 mg total) by mouth daily. 90 tablet 3   Biotin w/ Vitamins C & E (HAIR/SKIN/NAILS PO) Take by mouth.     Calcium -Magnesium -Zinc-Vit D3 (CALCIUM -MAGNESIUM -ZINC-D3 PO) Take by mouth.     co-enzyme Q-10 30 MG capsule Take 30 mg by mouth 3 (three) times daily.     dorzolamide  (TRUSOPT ) 2 % ophthalmic solution 1 drop into affected eye Ophthalmic twice a day     dorzolamide -timolol  (COSOPT ) 22.3-6.8 MG/ML ophthalmic solution Place 1 drop into both eyes 2 (two) times daily.      gabapentin  (NEURONTIN ) 300 MG capsule Take 1 capsule (300 mg total) by mouth 3 (three) times daily. 270 capsule 3   irbesartan  (AVAPRO ) 300 MG tablet TAKE 1 TABLET BY MOUTH DAILY 90 tablet 3   latanoprost  (XALATAN ) 0.005 % ophthalmic solution Place 1 drop into the left eye at bedtime.     levothyroxine  (SYNTHROID ) 75 MCG tablet Take 1 tablet (75 mg) five days a week and 1.5 tablets 2 days a week 150 tablet 1   Multiple Vitamin (MULTIVITAMIN ADULT) TABS Take 1 tablet by mouth daily.     rosuvastatin  (CRESTOR ) 5 MG tablet TAKE 1 TABLET BY MOUTH TWICE  WEEKLY 26 tablet 3   VITAMIN D  PO Take by mouth.     Ferrous Gluconate (IRON 27 PO) Take by mouth.     polyvinyl alcohol (LIQUIFILM TEARS) 1.4 % ophthalmic solution Place 1 drop into both eyes daily as needed (for dry eyes).     Current Facility-Administered Medications  Medication Dose Route Frequency Provider Last Rate Last Admin   0.9 %  sodium chloride  infusion  500 mL Intravenous Once Deland Slocumb V, MD        Allergies as of 04/07/2024 - Review Complete 04/07/2024  Allergen Reaction Noted   Denosumab Other (See Comments) 03/09/2024   Erythromycin Nausea And  Vomiting and Nausea Only 06/29/2015   Ezetimibe Other (See Comments) 08/08/2020   Latex Other (See Comments) 01/01/2023   Other Itching 09/14/2012   Statins Other (See Comments) 08/08/2020    Family History  Problem Relation Age of Onset   Colon polyps Mother    Hyperlipidemia Mother    Heart disease Mother    Diabetes Mother    Hypertension Mother    Hyperlipidemia  Father    Colon cancer Father 73   Hearing loss Sister    Hyperlipidemia Sister    Heart disease Sister    Heart disease Sister    Hyperlipidemia Sister    Stroke Sister    Hypertension Sister    Thyroid  disease Sister    Hyperlipidemia Brother    Drug abuse Brother    Early death Brother    Esophageal cancer Brother 77       smoker   Diabetes Brother    Heart disease Brother    Hypertension Brother    Heart attack Brother    Early death Brother    Colon cancer Paternal Aunt 76   Heart disease Maternal Grandmother    Early death Maternal Grandmother    Diabetes Maternal Grandmother    Asthma Maternal Grandmother    Diabetes Paternal Grandmother    Hypertension Paternal Grandmother    Heart disease Paternal Grandmother    Breast cancer Paternal Grandmother        dx in her 61s   Heart disease Paternal Grandfather    Depression Daughter    Cervical cancer Daughter        dx in her 51s   Celiac disease Daughter    Rectal cancer Son    Prostate cancer Son    Thyroid  disease Other 52       niece    Social History   Socioeconomic History   Marital status: Single    Spouse name: Not on file   Number of children: 3   Years of education: 14   Highest education level: Associate degree: occupational, Scientist, product/process development, or vocational program  Occupational History   Occupation: retired  Tobacco Use   Smoking status: Former    Current packs/day: 0.00    Average packs/day: 0.5 packs/day for 3.0 years (1.5 ttl pk-yrs)    Types: Cigarettes    Start date: 01/24/1966    Quit date: 04/27/1968    Years since  quitting: 55.9   Smokeless tobacco: Never   Tobacco comments:    quit over 40 years ago  Vaping Use   Vaping status: Never Used  Substance and Sexual Activity   Alcohol use: Yes    Comment: 1 every other month   Drug use: No   Sexual activity: Not Currently  Other Topics Concern   Not on file  Social History Narrative   Patient is single and lives alone.Patient has 3 children and 1 step daughter. 5 grands, +3 steps,  6 great grands(step). 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   Social Drivers of Health   Financial Resource Strain: Medium Risk (12/20/2023)   Overall Financial Resource Strain (CARDIA)    Difficulty of Paying Living Expenses: Somewhat hard  Food Insecurity: No Food Insecurity (12/20/2023)   Hunger Vital Sign    Worried About Running Out of Food in the Last Year: Never true    Ran Out of Food in the Last Year: Never true  Transportation Needs: No Transportation Needs (12/20/2023)   PRAPARE - Administrator, Civil Service (Medical): No    Lack of Transportation (Non-Medical): No  Physical Activity: Sufficiently Active (12/20/2023)   Exercise Vital Sign    Days of Exercise per Week: 3 days    Minutes of Exercise per Session: 60 min  Stress: No Stress Concern Present (12/20/2023)   [employer] of Occupational Health -  Occupational Stress Questionnaire    Feeling of Stress : Not at all  Social Connections: Moderately Integrated (12/20/2023)   Social Connection and Isolation Panel [NHANES]    Frequency of Communication with Friends and Family: More than three times a week    Frequency of Social Gatherings with Friends and Family: Once a week    Attends Religious Services: More than 4 times per year    Active Member of [employer] or Organizations: Yes    Attends Banker Meetings: More than 4 times per year    Marital Status: Widowed   Intimate Partner Violence: Not At Risk (02/26/2022)   Humiliation, Afraid, Rape, and Kick questionnaire    Fear of Current or Ex-Partner: No    Emotionally Abused: No    Physically Abused: No    Sexually Abused: No    Review of Systems:  All other review of systems negative except as mentioned in the HPI.  Physical Exam: Vital signs in last 24 hours: BP (!) 137/59   Pulse (!) 49   Temp 98.2 F (36.8 C) (Skin)   Ht 5\' 3"  (1.6 m)   Wt 155 lb (70.3 kg)   SpO2 98%   BMI 27.46 kg/m  General:   Alert, NAD Lungs:  Clear .   Heart:  Regular rate and rhythm Abdomen:  Soft, nontender and nondistended. Neuro/Psych:  Alert and cooperative. Normal mood and affect. A and O x 3  Reviewed labs, radiology imaging, old records and pertinent past GI work up  Patient is appropriate for planned procedure(s) and anesthesia in an ambulatory setting   K. Veena Sione Baumgarten , MD [phone]

## 2024-04-07 NOTE — Op Note (Signed)
 [facility] Patient Name: [name] [name] Procedure Date: 04/07/2024 10:49 AM MRN: [id number] Endoscopist: Sergio Dandy , MD, [id number] Age: 78 Referring MD:  Date of Birth: Oct 06, 1946 Gender: Female Account #: [id number] Procedure:                Colonoscopy Indications:              Surveillance: Piecemeal removal of large sessile                            adenoma last colonoscopy (< 3 yrs), High risk colon                            cancer surveillance: Personal history of sessile                            serrated colon polyp (10 mm or greater in size) Medicines:                Monitored Anesthesia Care Procedure:                Pre-Anesthesia Assessment:                           - Prior to the procedure, a History and Physical                            was performed, and patient medications and                            allergies were reviewed. The patient's tolerance of                            previous anesthesia was also reviewed. The risks                            and benefits of the procedure and the sedation                            options and risks were discussed with the patient.                            All questions were answered, and informed consent                            was obtained. Prior Anticoagulants: The patient has                            taken no anticoagulant or antiplatelet agents. ASA                            Grade Assessment: III - A patient with severe                            systemic disease. After reviewing the risks and  benefits, the patient was deemed in satisfactory                            condition to undergo the procedure.                           After obtaining informed consent, the colonoscope                            was passed under direct vision. Throughout the                            procedure, the patient's blood pressure, pulse, and                             oxygen saturations were monitored continuously. The                            Olympus Scope SN: [id number] was introduced through                            the anus and advanced to the the cecum, identified                            by appendiceal orifice and ileocecal valve. The                            colonoscopy was performed without difficulty. The                            patient tolerated the procedure well. The quality                            of the bowel preparation was adequate. The                            ileocecal valve, appendiceal orifice, and rectum                            were photographed. Scope In: 10:56:39 AM Scope Out: 11:20:20 AM Scope Withdrawal Time: 0 hours 17 minutes 40 seconds  Total Procedure Duration: 0 hours 23 minutes 41 seconds  Findings:                 The perianal and digital rectal examinations were                            normal.                           A 30 mm polyp was found in the ascending colon. The                            polyp was granular lateral spreading. The polyp was  removed with a piecemeal technique using a cold                            snare. Resection and retrieval were complete.                           Scattered small-mouthed diverticula were found in                            the sigmoid colon and descending colon.                           Non-bleeding external and internal hemorrhoids were                            found during retroflexion. The hemorrhoids were                            medium-sized. Complications:            No immediate complications. Estimated Blood Loss:     Estimated blood loss was minimal. Impression:               - One 30 mm polyp in the ascending colon, removed                            piecemeal using a cold snare. Resected and                            retrieved.                           - Diverticulosis in the sigmoid colon and in the                             descending colon.                           - Non-bleeding external and internal hemorrhoids. Recommendation:           - Patient has a contact number available for                            emergencies. The signs and symptoms of potential                            delayed complications were discussed with the                            patient. Return to normal activities tomorrow.                            Written discharge instructions were provided to the                            patient.                           -  Resume previous diet.                           - Continue present medications.                           - Await pathology results.                           - Repeat colonoscopy in 1 year for surveillance                            based on pathology results. Trana Ressler V. Kierah Goatley, MD 04/07/2024 11:25:39 AM This report has been signed electronically.

## 2024-04-07 NOTE — Progress Notes (Signed)
 Pt's states no medical or surgical changes since previsit or office visit.

## 2024-04-07 NOTE — Progress Notes (Signed)
 Sedate, gd SR, tolerated procedure well, VSS, report to RN

## 2024-04-08 ENCOUNTER — Telehealth: Payer: Self-pay | Admitting: Lactation Services

## 2024-04-08 NOTE — Telephone Encounter (Signed)
  Follow up Call-     04/07/2024   10:12 AM 11/28/2021    2:19 PM  Call back number  Post procedure Call Back phone  # [phone]  Permission to leave phone message Yes No     Patient questions:  Do you have a fever, pain , or abdominal swelling? No. Pain Score  0 *  Have you tolerated food without any problems? Yes.    Have you been able to return to your normal activities? Yes.    Do you have any questions about your discharge instructions: Diet   No. Medications  No. Follow up visit  No.  Do you have questions or concerns about your Care? No.  Actions: * If pain score is 4 or above: No action needed, pain <4.

## 2024-04-13 LAB — SURGICAL PATHOLOGY

## 2024-05-12 ENCOUNTER — Ambulatory Visit: Payer: Self-pay | Admitting: Gastroenterology

## 2024-06-04 ENCOUNTER — Telehealth: Payer: Self-pay | Admitting: *Deleted

## 2024-06-04 ENCOUNTER — Other Ambulatory Visit: Payer: Self-pay | Admitting: *Deleted

## 2024-06-04 DIAGNOSIS — M545 Low back pain, unspecified: Secondary | ICD-10-CM

## 2024-06-04 DIAGNOSIS — G9520 Unspecified cord compression: Secondary | ICD-10-CM

## 2024-06-04 DIAGNOSIS — G894 Chronic pain syndrome: Secondary | ICD-10-CM

## 2024-06-04 DIAGNOSIS — G243 Spasmodic torticollis: Secondary | ICD-10-CM

## 2024-06-04 NOTE — Telephone Encounter (Signed)
 Referral placed.

## 2024-06-04 NOTE — Telephone Encounter (Signed)
 Copied from CRM [id number]. Topic: Referral - Request for Referral >> Jun 04, 2024  8:16 AM Mercedes MATSU wrote: Did the patient discuss referral with their provider in the last year? No (If No - schedule appointment) (If Yes - send message)  Appointment offered? No  Type of order/referral and detailed reason for visit: Neurological Referral, patient has a rod in her neck she had put in 2010 and she has been having headaches every day and chronic pain.  Preference of office, provider, location: Wherever Dr. Wendolyn reccomends  If referral order, have you been seen by this specialty before? No (If Yes, this issue or another issue? When? Where?  Can we respond through MyChart? No, please call patient by phone [phone].

## 2024-06-07 NOTE — Telephone Encounter (Unsigned)
 Copied from CRM [id number]. Topic: Referral - Question >> Jun 07, 2024 12:46 PM Macario HERO wrote: Reason for CRM: Patient is requesting the referral to the neurosurgeon who is in [place] and closer to her incase she had to have surgery she would be closer to family.

## 2024-06-11 NOTE — Progress Notes (Unsigned)
 Referring Physician:  Wendolyn Jenkins Jansky, MD [address] [place],  [place] [address]  Primary Physician:  Wendolyn Jenkins Jansky, MD  History of Present Illness: 06/16/24 [name] [name] has a history of a C5-6 ACDF approximately 15 years ago.  However she comes in today for increased neck pain.  She noticed that this is increased after increasing work activity and using her hands while doing products around the house.  She complains that pain to the back of her head and neck that is dull in nature and constant.  She denies any pain extending down her arms.  She endorses new headaches as well as feeling off balance.  She feels as though her arms are becoming more weak and doing buttons and zippers she has had more difficult with.  She is also noticed it is hard to open jars.  She says she has chronic numbness in bilateral upper extremities, right hand worse than her left.  No changes to gait, issues with saddle anesthesia or incontinence.    Duration:   Quality: stiffness Severity: 7/10   Weakness: none Timing: constant Bowel/Bladder Dysfunction: none  Conservative measures:  Physical therapy: none recently Multimodal medical therapy including regular antiinflammatories: gabapentin  Injections: no recent epidural steroid injections since before her surgery  Past Surgery:  Neck Surgery in 2010  [name] [name] has no symptoms of cervical myelopathy.  The symptoms are causing a significant impact on the patient's life.   Review of Systems:  A 10 point review of systems is negative, except for the pertinent positives and negatives detailed in the HPI.  Past Medical History: Past Medical History:  Diagnosis Date   Abnormal Pap smear    Abnormal peripheral vision    RT EYE   Allergy    Anemia    Arthritis    back and neck   Breast cancer (HCC) 1997   right breast   Bruises easily    Cataracts, bilateral    Chronic back pain    Chronic neck pain    Complication of  anesthesia    slow to wake up   Family history of adverse reaction to anesthesia    Dad takes a long time to wake up   Frequency of urination    GERD (gastroesophageal reflux disease)    Glaucoma    Goiter    High cholesterol    History of postoperative nausea and vomiting    Hypertension    Hypothyroidism    Neuromuscular disorder (HCC)    Numbness    hands / feet /arms / legs - [place]. [place]   Osteoporosis    PONV (postoperative nausea and vomiting)    Post-menopausal    Prediabetes    Sexual dysfunction    Shortness of breath    with exertion and Wt gain   Urinary incontinence     Past Surgical History: Past Surgical History:  Procedure Laterality Date   BIOPSY THYROID      BREAST SURGERY Right    lymph node removed   CATARACT EXTRACTION, BILATERAL  2009,2010   COLONOSCOPY  02/15/2014   Dr.Stark   EYE SURGERY  08/2012   detached retina surg   LAPAROSCOPY  1978   BTL   lumpectomy  1997   Rt   neck fusion  2010   THYROIDECTOMY N/A 05/04/2013   Procedure:  TOTAL THYROIDECTOMY;  Surgeon: Krystal CHRISTELLA Spinner, MD;  Location: [facility];  Service: General;  Laterality: N/A;  TOTAL HIP ARTHROPLASTY Right 01/02/2021   Procedure: TOTAL HIP ARTHROPLASTY ANTERIOR APPROACH;  Surgeon: Ernie Cough, MD;  Location: [facility];  Service: Orthopedics;  Laterality: Right;  70 mins   TUBAL LIGATION      Allergies: Allergies as of 06/16/2024 - Review Complete 06/16/2024  Allergen Reaction Noted   Denosumab Other (See Comments) 03/09/2024   Erythromycin Nausea And Vomiting and Nausea Only 06/29/2015   Ezetimibe Other (See Comments) 08/08/2020   Latex Other (See Comments) 01/01/2023   Other Itching 09/14/2012   Statins Other (See Comments) 08/08/2020    Medications: Outpatient Encounter Medications as of 06/16/2024  Medication Sig   amLODipine  (NORVASC ) 5 MG tablet Take 1 tablet (5 mg total) by mouth daily.   Biotin w/ Vitamins C & E (HAIR/SKIN/NAILS PO) Take by  mouth.   Calcium -Magnesium -Zinc-Vit D3 (CALCIUM -MAGNESIUM -ZINC-D3 PO) Take by mouth.   co-enzyme Q-10 30 MG capsule Take 30 mg by mouth 3 (three) times daily.   dorzolamide  (TRUSOPT ) 2 % ophthalmic solution 1 drop into affected eye Ophthalmic twice a day   dorzolamide -timolol  (COSOPT ) 22.3-6.8 MG/ML ophthalmic solution Place 1 drop into both eyes 2 (two) times daily.    Ferrous Gluconate (IRON 27 PO) Take by mouth.   gabapentin  (NEURONTIN ) 300 MG capsule Take 1 capsule (300 mg total) by mouth 3 (three) times daily.   irbesartan  (AVAPRO ) 300 MG tablet TAKE 1 TABLET BY MOUTH DAILY   latanoprost  (XALATAN ) 0.005 % ophthalmic solution Place 1 drop into the left eye at bedtime.   levothyroxine  (SYNTHROID ) 75 MCG tablet Take 1 tablet (75 mg) five days a week and 1.5 tablets 2 days a week   Multiple Vitamin (MULTIVITAMIN ADULT) TABS Take 1 tablet by mouth daily.   polyvinyl alcohol (LIQUIFILM TEARS) 1.4 % ophthalmic solution Place 1 drop into both eyes daily as needed (for dry eyes).   rosuvastatin  (CRESTOR ) 5 MG tablet TAKE 1 TABLET BY MOUTH TWICE  WEEKLY   VITAMIN D  PO Take by mouth.   No facility-administered encounter medications on file as of 06/16/2024.    Social History: Social History   Tobacco Use   Smoking status: Former    Current packs/day: 0.00    Average packs/day: 0.5 packs/day for 3.0 years (1.5 ttl pk-yrs)    Types: Cigarettes    Start date: 01/24/1966    Quit date: 04/27/1968    Years since quitting: 56.1   Smokeless tobacco: Never   Tobacco comments:    quit over 40 years ago  Vaping Use   Vaping status: Never Used  Substance Use Topics   Alcohol use: Yes    Comment: 1 every other month   Drug use: No    Family Medical History: Family History  Problem Relation Age of Onset   Colon polyps Mother    Hyperlipidemia Mother    Heart disease Mother    Diabetes Mother    Hypertension Mother    Hyperlipidemia Father    Colon cancer Father 25   Hearing loss Sister     Hyperlipidemia Sister    Heart disease Sister    Heart disease Sister    Hyperlipidemia Sister    Stroke Sister    Hypertension Sister    Thyroid  disease Sister    Hyperlipidemia Brother    Drug abuse Brother    Early death Brother    Esophageal cancer Brother 34       smoker   Diabetes Brother    Heart disease Brother    Hypertension Brother  Heart attack Brother    Early death Brother    Colon cancer Paternal Aunt 75   Heart disease Maternal Grandmother    Early death Maternal Grandmother    Diabetes Maternal Grandmother    Asthma Maternal Grandmother    Diabetes Paternal Grandmother    Hypertension Paternal Grandmother    Heart disease Paternal Grandmother    Breast cancer Paternal Grandmother        dx in her 13s   Heart disease Paternal Grandfather    Depression Daughter    Cervical cancer Daughter        dx in her 36s   Celiac disease Daughter    Rectal cancer Son    Prostate cancer Son    Thyroid  disease Other 67       niece    Physical Examination: @VITALWITHPAIN @  General: Patient is well developed, well nourished, calm, collected, and in no apparent distress. Attention to examination is appropriate.  Psychiatric: Patient is non-anxious.  Head:  Pupils equal, round, and reactive to light.  ENT:  Oral mucosa appears well hydrated.  Neck:   Supple.  Full range of motion.  Respiratory: Patient is breathing without any difficulty.  Extremities: No edema.  Vascular: Palpable dorsal pedal pulses.  Skin:   On exposed skin, there are no abnormal skin lesions.  NEUROLOGICAL:     Awake, alert, oriented to person, place, and time.  Speech is clear and fluent. Fund of knowledge is appropriate.   Cranial Nerves: Pupils equal round and reactive to light.  Facial tone is symmetric.   ROM of spine: Minimal tenderness palpation of cervical paraspinals  Negative tinel of occipital nerve nerves    Strength: Side Biceps Triceps Deltoid Interossei Grip  Wrist Ext. Wrist Flex.  R 5 5 5 5 5 5 5   L 5 5 5 5 5 5 5     Reflexes are 2+ and symmetric at the biceps, triceps, brachioradialis.  Hoffman's is absent.  Bilateral upper extremity sensation is intact to light touch.    Gait is normal.   No difficulty with tandem gait.   No evidence of dysmetria noted.  Medical Decision Making  Imaging: EXAM: CERVICAL SPINE - COMPLETE 4+ VIEW; THORACIC SPINE 2 VIEWS   COMPARISON:  06/14/2019.   FINDINGS: Cervical spine: There is no evidence of acute cervical spine fracture or prevertebral soft tissue swelling. Anterior cervical spinal fusion hardware is noted at C5-C6. Alignment is normal. Intervertebral disc space narrowing is noted at C4-C5. The neural foramina are patent on the right. The left neural foramina are not well evaluated. Surgical clips are noted in the cervical soft tissues. Mild facet arthropathy is noted bilaterally.   Thoracic spine: No acute fracture in the thoracic spine. Mild degenerative endplate changes are noted at multiple levels. Aortic atherosclerosis is noted.   IMPRESSION: 1. No acute fracture. 2. Multilevel degenerative changes in the cervical and thoracic spine. 3. Anterior cervical spinal fusion at C5-C6.  EXAM: CERVICAL SPINE - COMPLETE 4+ VIEW; THORACIC SPINE 2 VIEWS   COMPARISON:  06/14/2019.   FINDINGS: Cervical spine: There is no evidence of acute cervical spine fracture or prevertebral soft tissue swelling. Anterior cervical spinal fusion hardware is noted at C5-C6. Alignment is normal. Intervertebral disc space narrowing is noted at C4-C5. The neural foramina are patent on the right. The left neural foramina are not well evaluated. Surgical clips are noted in the cervical soft tissues. Mild facet arthropathy is noted bilaterally.   Thoracic spine: No acute  fracture in the thoracic spine. Mild degenerative endplate changes are noted at multiple levels. Aortic atherosclerosis is noted.    IMPRESSION: 1. No acute fracture. 2. Multilevel degenerative changes in the cervical and thoracic spine. 3. Anterior cervical spinal fusion at C5-C6.    I have personally reviewed the images and agree with the above interpretation.  Assessment and Plan: [name] [name] is a pleasant 78 y.o. female has a history of a C5-6 ACDF approximately 15 years ago.  However she comes in today for increased neck pain.  She noticed that this is increased after increasing work activity and using her hands while doing products around the house.  She complains that pain to the back of her head and neck that is dull in nature and constant.  She denies any pain extending down her arms.  She endorses new headaches as well as feeling off balance.  She feels as though her arms are becoming more weak and doing buttons and zippers she has had more difficult with.  She is also noticed it is hard to open jars.  She says she has chronic numbness in bilateral upper extremities, right hand worse than her left.  No changes to gait, issues with saddle anesthesia or incontinence.  On examination she is full strength.  Negative Tinel of occipital nerves.  Some mild tenderness palpation of her cervical paraspinals.  It was a pleasure to see patient in clinic today.  Patient likely suffering from some myofascial pain, however with patient's history and decreased function and fine motor skills, would like her to undergo additional imaging.  Plan for cervical 4 view today.  Physical therapy referral has been sent.  MRI of her cervical spine has been ordered.  Plan to see back in approximately 8 weeks with Dr. Claudene.  Thank you for involving me in the care of this patient.   I spent a total of 30 minutes in both face-to-face and non-face-to-face activities for this visit on the date of this encounter including reviewing patient's chart, obtaining history and physical examination, reviewing previous images, counseling patient, ordering  additional test.  Lyle Decamp, PA-C Dept. of Neurosurgery

## 2024-06-16 ENCOUNTER — Encounter: Payer: Self-pay | Admitting: Physician Assistant

## 2024-06-16 ENCOUNTER — Ambulatory Visit
Admission: RE | Admit: 2024-06-16 | Discharge: 2024-06-16 | Disposition: A | Discharge status: Home / Self Care | Attending: Physician Assistant | Admitting: Physician Assistant

## 2024-06-16 ENCOUNTER — Ambulatory Visit: Admitting: Physician Assistant

## 2024-06-16 ENCOUNTER — Ambulatory Visit
Admission: RE | Admit: 2024-06-16 | Discharge: 2024-06-16 | Disposition: A | Discharge status: Home / Self Care | Source: Ambulatory Visit | Attending: Physician Assistant | Admitting: Physician Assistant

## 2024-06-16 VITALS — BP 128/78 | Ht 63.0 in | Wt 157.2 lb

## 2024-06-16 DIAGNOSIS — Z981 Arthrodesis status: Secondary | ICD-10-CM

## 2024-06-16 DIAGNOSIS — M542 Cervicalgia: Secondary | ICD-10-CM | POA: Insufficient documentation

## 2024-06-20 ENCOUNTER — Other Ambulatory Visit: Payer: Self-pay | Admitting: Family Medicine

## 2024-06-23 ENCOUNTER — Ambulatory Visit: Payer: Self-pay | Admitting: Family Medicine

## 2024-06-23 ENCOUNTER — Ambulatory Visit (INDEPENDENT_AMBULATORY_CARE_PROVIDER_SITE_OTHER): Payer: Medicare Other | Admitting: Family Medicine

## 2024-06-23 ENCOUNTER — Encounter: Payer: Self-pay | Admitting: Family Medicine

## 2024-06-23 VITALS — BP 160/80 | HR 38 | Temp 97.5°F | Resp 18 | Ht 63.0 in | Wt 159.1 lb

## 2024-06-23 DIAGNOSIS — E7801 Familial hypercholesterolemia: Secondary | ICD-10-CM | POA: Diagnosis not present

## 2024-06-23 DIAGNOSIS — E039 Hypothyroidism, unspecified: Secondary | ICD-10-CM

## 2024-06-23 DIAGNOSIS — I10 Essential (primary) hypertension: Secondary | ICD-10-CM | POA: Diagnosis not present

## 2024-06-23 DIAGNOSIS — M810 Age-related osteoporosis without current pathological fracture: Secondary | ICD-10-CM

## 2024-06-23 DIAGNOSIS — R7303 Prediabetes: Secondary | ICD-10-CM | POA: Diagnosis not present

## 2024-06-23 LAB — COMPREHENSIVE METABOLIC PANEL WITH GFR
ALT: 16 U/L (ref 0–35)
AST: 20 U/L (ref 0–37)
Albumin: 4 g/dL (ref 3.5–5.2)
Alkaline Phosphatase: 65 U/L (ref 39–117)
BUN: 11 mg/dL (ref 6–23)
CO2: 25 meq/L (ref 19–32)
Calcium: 8.9 mg/dL (ref 8.4–10.5)
Chloride: 99 meq/L (ref 96–112)
Creatinine, Ser: 0.5 mg/dL (ref 0.40–1.20)
GFR: 90.54 mL/min (ref >60.00)
Glucose, Bld: 76 mg/dL (ref 70–99)
Potassium: 3.9 meq/L (ref 3.5–5.1)
Sodium: 133 meq/L — ABNORMAL LOW (ref 135–145)
Total Bilirubin: 0.4 mg/dL (ref 0.2–1.2)
Total Protein: 6.5 g/dL (ref 6.0–8.3)

## 2024-06-23 LAB — CBC WITH DIFFERENTIAL/PLATELET
Basophils Absolute: 0 K/uL (ref 0.0–0.1)
Basophils Relative: 0.6 % (ref 0.0–3.0)
Eosinophils Absolute: 0.1 K/uL (ref 0.0–0.7)
Eosinophils Relative: 1.4 % (ref 0.0–5.0)
HCT: 36.7 % (ref 36.0–46.0)
Hemoglobin: 12.5 g/dL (ref 12.0–15.0)
Lymphocytes Relative: 35.5 % (ref 12.0–46.0)
Lymphs Abs: 2.5 K/uL (ref 0.7–4.0)
MCHC: 34 g/dL (ref 30.0–36.0)
MCV: 92.6 fl (ref 78.0–100.0)
Monocytes Absolute: 0.5 K/uL (ref 0.1–1.0)
Monocytes Relative: 6.5 % (ref 3.0–12.0)
Neutro Abs: 3.9 K/uL (ref 1.4–7.7)
Neutrophils Relative %: 56 % (ref 43.0–77.0)
Platelets: 244 K/uL (ref 150.0–400.0)
RBC: 3.97 Mil/uL (ref 3.87–5.11)
RDW: 12.8 % (ref 11.5–15.5)
WBC: 7 K/uL (ref 4.0–10.5)

## 2024-06-23 LAB — LIPID PANEL
Cholesterol: 237 mg/dL — ABNORMAL HIGH (ref 0–200)
HDL: 46.7 mg/dL (ref >39.00)
LDL Cholesterol: 159 mg/dL — ABNORMAL HIGH (ref 0–99)
NonHDL: 190.62
Total CHOL/HDL Ratio: 5
Triglycerides: 158 mg/dL — ABNORMAL HIGH (ref 0.0–149.0)
VLDL: 31.6 mg/dL (ref 0.0–40.0)

## 2024-06-23 LAB — CK: Total CK: 79 U/L (ref 17–177)

## 2024-06-23 LAB — VITAMIN D 25 HYDROXY (VIT D DEFICIENCY, FRACTURES): VITD: 37.51 ng/mL (ref 30.00–100.00)

## 2024-06-23 LAB — HEMOGLOBIN A1C: Hgb A1c MFr Bld: 5.7 % (ref 4.6–6.5)

## 2024-06-23 LAB — TSH: TSH: 0.91 u[IU]/mL (ref 0.35–5.50)

## 2024-06-23 NOTE — Progress Notes (Signed)
 Sodium slightly low-make sure not drinking too much water  Of course, cholesterol too high.  Since trouble w/other meds, does she want to try repatha?-injectable, non-statin

## 2024-06-23 NOTE — Progress Notes (Signed)
 Subjective:     Patient ID: [name] [name], female    DOB: 1946/05/16, 78 y.o.   MRN: [id number]  Chief Complaint  Patient presents with   Medical Management of Chronic Issues    6 month follow-up Not fasting Having a lot of muscle aches     HPI Htn, edema, hld predm, thy  Health Maintenance Due  Topic Date Due   Zoster Vaccines- Shingrix (1 of 2) Never done   Medicare Annual Wellness (AWV)  02/27/2023    Past Medical History:  Diagnosis Date   Abnormal Pap smear    Abnormal peripheral vision    RT EYE   Allergy    Anemia    Arthritis    back and neck   Breast cancer (HCC) 1997   right breast   Bruises easily    Cataracts, bilateral    Chronic back pain    Chronic neck pain    Complication of anesthesia    slow to wake up   Family history of adverse reaction to anesthesia    Dad takes a long time to wake up   Frequency of urination    GERD (gastroesophageal reflux disease)    Glaucoma    Goiter    High cholesterol    History of postoperative nausea and vomiting    Hypertension    Hypothyroidism    Neuromuscular disorder (HCC)    Numbness    hands / feet /arms / legs - followed by Dr. [place] - [place]   Osteoporosis    PONV (postoperative nausea and vomiting)    Post-menopausal    Prediabetes    Sexual dysfunction    Shortness of breath    with exertion and Wt gain   Urinary incontinence     Past Surgical History:  Procedure Laterality Date   BIOPSY THYROID      BREAST SURGERY Right    lymph node removed   CATARACT EXTRACTION, BILATERAL  2009,2010   COLONOSCOPY  02/15/2014   Dr.Stark   EYE SURGERY  08/2012   detached retina surg   LAPAROSCOPY  1978   BTL   lumpectomy  1997   Rt   neck fusion  2010   THYROIDECTOMY N/A 05/04/2013   Procedure:  TOTAL THYROIDECTOMY;  Surgeon: Krystal CHRISTELLA Spinner, MD;  Location: [facility];  Service: General;  Laterality: N/A;   TOTAL HIP ARTHROPLASTY Right 01/02/2021   Procedure: TOTAL HIP ARTHROPLASTY  ANTERIOR APPROACH;  Surgeon: Ernie Cough, MD;  Location: [facility];  Service: Orthopedics;  Laterality: Right;  70 mins   TUBAL LIGATION       Current Outpatient Medications:    amLODipine  (NORVASC ) 5 MG tablet, Take 1 tablet (5 mg total) by mouth daily., Disp: 90 tablet, Rfl: 3   Biotin w/ Vitamins C & E (HAIR/SKIN/NAILS PO), Take by mouth., Disp: , Rfl:    Calcium -Magnesium -Zinc-Vit D3 (CALCIUM -MAGNESIUM -ZINC-D3 PO), Take by mouth., Disp: , Rfl:    co-enzyme Q-10 30 MG capsule, Take 30 mg by mouth 3 (three) times daily., Disp: , Rfl:    dorzolamide  (TRUSOPT ) 2 % ophthalmic solution, 1 drop into affected eye Ophthalmic twice a day, Disp: , Rfl:    dorzolamide -timolol  (COSOPT ) 22.3-6.8 MG/ML ophthalmic solution, Place 1 drop into both eyes 2 (two) times daily. , Disp: , Rfl:    Ferrous Gluconate (IRON 27 PO), Take by mouth., Disp: , Rfl:    gabapentin  (NEURONTIN ) 300 MG capsule, Take 1 capsule (300 mg total) by mouth 3 (  three) times daily., Disp: 270 capsule, Rfl: 3   irbesartan  (AVAPRO ) 300 MG tablet, TAKE 1 TABLET BY MOUTH DAILY, Disp: 90 tablet, Rfl: 3   latanoprost  (XALATAN ) 0.005 % ophthalmic solution, Place 1 drop into the left eye at bedtime., Disp: , Rfl:    levothyroxine  (SYNTHROID ) 75 MCG tablet, Take 1 tablet (75 mg) five days a week and 1.5 tablets 2 days a week, Disp: 150 tablet, Rfl: 1   Multiple Vitamin (MULTIVITAMIN ADULT) TABS, Take 1 tablet by mouth daily., Disp: , Rfl:    polyvinyl alcohol (LIQUIFILM TEARS) 1.4 % ophthalmic solution, Place 1 drop into both eyes daily as needed (for dry eyes)., Disp: , Rfl:    rosuvastatin  (CRESTOR ) 5 MG tablet, TAKE 1 TABLET BY MOUTH TWICE  WEEKLY, Disp: 26 tablet, Rfl: 3   VITAMIN D  PO, Take by mouth., Disp: , Rfl:   Allergies  Allergen Reactions   Denosumab Other (See Comments)    Causes Muscle Pain   Erythromycin Nausea And Vomiting and Nausea Only    Other reaction(s): Unknown   Ezetimibe Other (See Comments)    Other reaction(s):  myalgias   Latex Other (See Comments)   Other Itching    Unknown Dye PET scan dye possibly= itching   Statins Other (See Comments)    Other reaction(s): myalgias   ROS neg/noncontributory except as noted HPI/below      Objective:     BP (!) 160/80 (BP Location: Left Arm, Cuff Size: Normal)   Pulse (!) 38   Temp (!) 97.5 F (36.4 C) (Temporal)   Resp 18   Ht 5' 3 (1.6 m)   Wt 159 lb 2 oz (72.2 kg)   SpO2 100%   BMI 28.19 kg/m  Wt Readings from Last 3 Encounters:  06/23/24 159 lb 2 oz (72.2 kg)  06/16/24 157 lb 3.2 oz (71.3 kg)  04/07/24 155 lb (70.3 kg)    Physical Exam   Gen: [facility] NAD HEENT: NCAT, conjunctiva not injected, sclera nonicteric NECK:  supple, no thyromegaly(absent), no nodes, +R carotid bruits CARDIAC: brady RRR, S1S2+, +2/6 murmur. DP 2+B LUNGS: CTAB. No wheezes ABDOMEN:  BS+, soft, NTND, No HSM, no masses EXT:  no edema MSK: no gross abnormalities.  NEURO: A&O x3.  CN II-XII intact.  PSYCH: normal mood. Good eye contact     Assessment & Plan:  Essential hypertension, benign -     CBC with Differential/Platelet -     Comprehensive metabolic panel with GFR  Prediabetes -     Hemoglobin A1c  Acquired hypothyroidism -     TSH  Familial hypercholesterolemia -     Comprehensive metabolic panel with GFR -     Lipid panel -     CK  Senile osteoporosis -     DG Bone Density; Future -     VITAMIN D  25 Hydroxy (Vit-D Deficiency, Fractures)  Assessment and Plan Assessment & Plan Cervical radiculopathy   Chronic neck pain persists following previous neck surgery, with inadequate follow-up and physical therapy raising concerns about long-term management. Proceed with the scheduled MRI and follow up with neurosurgery after obtaining results. Attempt to schedule physical therapy at available locations and discuss the previous lack of therapy with the neurosurgeon.  Right hip pain after prior hip surgery with underlying osteoporosis   Chronic right  hip pain and difficulty lifting the right leg may relate to previous hip surgery and osteoporosis. An X-ray confirmed correct hardware placement, but there are concerns about bone  deterioration affecting stability. She is reluctant to undergo further surgery.  Suspected right rotator cuff injury   Acute right shoulder pain with movement difficulty suggests a possible rotator cuff injury, though no definitive diagnosis is made. She avoids using her arms due to previous neck surgery advice. Avoid activities that exacerbate shoulder pain.  Osteoporosis   Osteoporosis management included Prolia and Reclast, but she is reluctant to continue due to side effects, preferring non-pharmacological options. A bone density scan is due as it has been two years since the last one. Order the scan, encourage weight-bearing exercises, and ensure adequate calcium  and vitamin D  intake. Discuss non-pharmacological management options.  Hypertension   Blood pressure readings at home are generally acceptable, though elevated during the visit, possibly due to pain. She is on amlodipine  and irbesartan . Continue the current antihypertensive regimen and monitor blood pressure at home.  Hyperlipidemia   Currently on rosuvastatin  5 mg twice a week, with concerns about the medication contributing to generalized achiness. Hold rosuvastatin  for two weeks to assess its impact on symptoms.  Hypothyroidism   Managed with Synthroid  75 mcg and an additional half dose twice a week. No recent thyroid  function tests discussed. Monitor symptoms and consider thyroid  function testing if indicated.  Bradycardia   Heart rate was 38 bpm during the visit, with a history of low heart rate and occasional dizziness. Previous cardiology evaluation for chest pain, but no recent follow-up. Refer to cardiology for bradycardia evaluation and monitor for dizziness or lightheadedness.    Return in about 6 months (around 12/24/2024) for chronic  follow-up.  Jenkins CHRISTELLA Carrel, MD

## 2024-06-23 NOTE — Patient Instructions (Addendum)
 It was very nice to see you today!  Call [facility] for bone density Call Dr. Faythe for f/u See Cardiology and ask about heart rate as well  Hold rosuvastatin  for 2 weeks and see if achey better.  - and let me know  Dr. Francyne -Card.     PLEASE NOTE:  If you had any lab tests please let us  know if you have not heard back within a few days. You may see your results on MyChart before we have a chance to review them but we will give you a call once they are reviewed by us . If we ordered any referrals today, please let us  know if you have not heard from their office within the next week.   Please try these tips to maintain a healthy lifestyle:  Eat most of your calories during the day when you are active. Eliminate processed foods including packaged sweets (pies, cakes, cookies), reduce intake of potatoes, white bread, white pasta, and white rice. Look for whole grain options, oat flour or almond flour.  Each meal should contain half fruits/vegetables, one quarter protein, and one quarter carbs (no bigger than a computer mouse).  Cut down on sweet beverages. This includes juice, soda, and sweet tea. Also watch fruit intake, though this is a healthier sweet option, it still contains natural sugar! Limit to 3 servings daily.  Drink at least 1 glass of water  with each meal and aim for at least 8 glasses per day  Exercise at least 150 minutes every week.

## 2024-06-29 ENCOUNTER — Ambulatory Visit
Admission: RE | Admit: 2024-06-29 | Discharge: 2024-06-29 | Disposition: A | Discharge status: Home / Self Care | Source: Ambulatory Visit | Attending: Physician Assistant | Admitting: Physician Assistant

## 2024-06-29 DIAGNOSIS — M542 Cervicalgia: Secondary | ICD-10-CM

## 2024-06-29 DIAGNOSIS — Z981 Arthrodesis status: Secondary | ICD-10-CM

## 2024-07-01 ENCOUNTER — Encounter: Payer: Self-pay | Admitting: Physical Therapy

## 2024-07-01 ENCOUNTER — Other Ambulatory Visit: Payer: Self-pay

## 2024-07-01 ENCOUNTER — Ambulatory Visit (INDEPENDENT_AMBULATORY_CARE_PROVIDER_SITE_OTHER): Admitting: Physical Therapy

## 2024-07-01 DIAGNOSIS — R2689 Other abnormalities of gait and mobility: Secondary | ICD-10-CM

## 2024-07-01 DIAGNOSIS — M5412 Radiculopathy, cervical region: Secondary | ICD-10-CM

## 2024-07-01 DIAGNOSIS — M5459 Other low back pain: Secondary | ICD-10-CM | POA: Diagnosis not present

## 2024-07-01 DIAGNOSIS — R293 Abnormal posture: Secondary | ICD-10-CM

## 2024-07-01 NOTE — Therapy (Unsigned)
 OUTPATIENT PHYSICAL THERAPY CERVICAL EVALUATION   Patient Name: [name] [name] MRN: [id number] DOB:Jan 03, 1946, 78 y.o., female Today's Date: 07/01/2024  END OF SESSION:  PT End of Session - 07/01/24 1518     Visit Number 1    PT Start Time 1520    PT Stop Time 1600    PT Time Calculation (min) 40 min    Activity Tolerance Patient tolerated treatment well    Behavior During Therapy WFL for tasks assessed/performed          Past Medical History:  Diagnosis Date   Abnormal Pap smear    Abnormal peripheral vision    RT EYE   Allergy    Anemia    Arthritis    back and neck   Breast cancer (HCC) 1997   right breast   Bruises easily    Cataracts, bilateral    Chronic back pain    Chronic neck pain    Complication of anesthesia    slow to wake up   Family history of adverse reaction to anesthesia    Dad takes a long time to wake up   Frequency of urination    GERD (gastroesophageal reflux disease)    Glaucoma    Goiter    High cholesterol    History of postoperative nausea and vomiting    Hypertension    Hypothyroidism    Neuromuscular disorder (HCC)    Numbness    hands / feet /arms / legs - followed by Dr. [place] - [place]   Osteoporosis    PONV (postoperative nausea and vomiting)    Post-menopausal    Prediabetes    Sexual dysfunction    Shortness of breath    with exertion and Wt gain   Urinary incontinence    Past Surgical History:  Procedure Laterality Date   BIOPSY THYROID      BREAST SURGERY Right    lymph node removed   CATARACT EXTRACTION, BILATERAL  2009,2010   COLONOSCOPY  02/15/2014   Dr.Stark   EYE SURGERY  08/2012   detached retina surg   LAPAROSCOPY  1978   BTL   lumpectomy  1997   Rt   neck fusion  2010   THYROIDECTOMY N/A 05/04/2013   Procedure:  TOTAL THYROIDECTOMY;  Surgeon: Krystal CHRISTELLA Spinner, MD;  Location: [facility];  Service: General;  Laterality: N/A;   TOTAL HIP ARTHROPLASTY Right 01/02/2021   Procedure: TOTAL HIP  ARTHROPLASTY ANTERIOR APPROACH;  Surgeon: Ernie Cough, MD;  Location: [facility];  Service: Orthopedics;  Laterality: Right;  70 mins   TUBAL LIGATION     Patient Active Problem List   Diagnosis Date Noted   Impaired fasting glucose 03/09/2024   Postoperative hypothyroidism 03/09/2024   Essential hypertension 03/09/2024   Osteoporosis 03/09/2024   Senile osteoporosis 09/02/2023   Prediabetes 06/26/2022   Aortic atherosclerosis (HCC) 12/26/2021   Polyp of colon 12/26/2021   Familial hypercholesterolemia 10/28/2021   Osteoarthritis of right hip 01/02/2021   Status post right hip replacement 01/02/2021   Primary open angle glaucoma (POAG) of right eye, mild stage 09/13/2018   Diplopia 11/09/2015   Pseudophakia of both eyes 11/09/2015   Right sided weakness 10/28/2013   Cervical dystonia 10/28/2013   Bilateral dry eyes 10/04/2013   S/P total thyroidectomy 08/03/2013   Hypothyroidism 08/03/2013   Other forms of retinal detachment(361.89) 01/18/2013   Urinary incontinence, mixed 01/18/2013   Constipation 01/18/2013   HTN (hypertension) 12/28/2012   History of breast cancer 09/16/2012  Right retinal detachment 09/16/2012   Degeneration of lumbar intervertebral disc 05/30/2012   Multiple thyroid  nodules 04/06/2012   Glaucoma 03/19/2012   Former smoker 03/19/2012   Chronic pain following surgery or procedure 03/19/2012   Mammographic calcification 03/19/2012   Anemia    Cataracts, bilateral    Dyslipidemia (high LDL; low HDL) 09/30/2011   Thyroiditis 09/25/2010   Neck pain, musculoskeletal 07/04/2010   History of detached retina repair 07/04/2010   Cervical cord compression with myelopathy (HCC) 09/28/2009   Family hx of colon cancer 08/15/2009   Chronic back pain 08/15/2009   S/P breast lumpectomy 08/15/2009   Malignant neoplasm of breast (HCC) 04/03/2009   Hyperlipidemia 04/03/2009   Arthritis 04/03/2009   Goiter 04/03/2009    PCP: Wendolyn Jenkins Jansky, MD  REFERRING  PROVIDER: Ulis Bottcher, PA-C  REFERRING DIAG: Z98.1 (ICD-10-CM) - S/P cervical spinal fusion M54.2 (ICD-10-CM) - Neck pain  THERAPY DIAG:  Radiculopathy, cervical region  Abnormal posture  Other low back pain  Other abnormalities of gait and mobility  Rationale for Evaluation and Treatment: Rehabilitation  ONSET DATE: Flare up ~1-2 weeks ago  SUBJECTIVE:                                                                                                                                                                                                         SUBJECTIVE STATEMENT: Pt states she has a rod in her neck in 2010. Reports it's been hurting a lot lately. Pt states she was in a lot of pain -- was doing more than usual at the time. Pt is concerned about her diagnosis with osteoporosis. Had the operation when she was in [place]  and didn't get a lot of follow up. Not sure what she is allowed to do. Was told not to reach up high or hyperextend but not sure if it's for the rest of her life or not. Does have chronic N/T in bilat hands but more on the right. Reports it may be from a disc issue. Does have pain across her back and notes weakness. Has been having more frequent headaches.   Hand dominance: Right  PERTINENT HISTORY:  Osteoporosis, ACDF 2010  PAIN:  Are you having pain? Yes: NPRS scale: 6 currently, at worst 10 Pain location: Back of neck and top of shoulders Pain description: Numbness, constant irritating nagging pain Aggravating factors: doing too much, reaching Relieving factors: new supplements, rest  PRECAUTIONS: None  RED FLAGS: None     WEIGHT BEARING RESTRICTIONS: No  FALLS:  Has patient fallen in last 6 months?  No  LIVING ENVIRONMENT: Lives with: lives alone Lives in: House/apartment Stairs: No Has following equipment at home: None  OCCUPATION: Retired  PLOF: Independent  PATIENT GOALS: Improve neck pain and reaching  NEXT MD VISIT: 08/11/24  neurosurgeon  OBJECTIVE:  Note: Objective measures were completed at Evaluation unless otherwise noted.  DIAGNOSTIC FINDINGS:  06/16/24 Cervical x-ray IMPRESSION: 1. Anterior fusion plate at R4-R3 without evidence for hardware loosening or acute fracture. 2. Mild degenerative endplate changes at C4-C5 and C6-C7.  MRI 06/29/24 results pending  PATIENT SURVEYS:  QuickDASH Score: 52.3 / 100 = 52.3 %  COGNITION: Overall cognitive status: Within functional limits for tasks assessed  SENSATION: Chronic N/T in bilat hands  POSTURE: rounded shoulders, forward head, and decreased thoracic kyphosis  PALPATION: TTP bilat suboccipitals, throughout cervical and upper thoracic paraspinals, R>L UT, SCMs Hypomobile upper cervical in all directions   CERVICAL ROM:   Active ROM A/PROM (deg) eval  Flexion 26  Extension 15  Right lateral flexion 20  Left lateral flexion 15  Right rotation 22  Left rotation 37   (Blank rows = not tested)  UPPER EXTREMITY MMT (within available range)  MMT: Right eval Left eval  Shoulder flexion 3 pulls in neck 3+ pulls in neck  Shoulder extension    Shoulder abduction 3 3+  Shoulder adduction    Shoulder extension 4 4  Shoulder internal rotation 3+ 4  Shoulder external rotation 3+ 4  Elbow flexion    Elbow extension    Wrist flexion    Wrist extension    Wrist ulnar deviation    Wrist radial deviation    Wrist pronation    Wrist supination     (Blank rows = not tested)  UPPER EXTREMITY ROM:  AROM Right eval Left eval  Shoulder flexion 100 105  Shoulder extension seated 55 70  Shoulder abduction 110 110  Shoulder adduction    Shoulder extension prone    Shoulder internal rotation T12 T10  Shoulder external rotation 70 70  Middle trapezius    Lower trapezius    Elbow flexion    Elbow extension    Wrist flexion    Wrist extension    Wrist ulnar deviation    Wrist radial deviation    Wrist pronation    Wrist supination    Grip  strength     (Blank rows = not tested)  CERVICAL SPECIAL TESTS:  Distraction test: felt good but didn't diminish her N/T  FUNCTIONAL TESTS:  Did not assess  TREATMENT DATE: 07/01/24 See HEP below                                                                                                                                 PATIENT EDUCATION:  Education details: Exam findings, POC, things to try at therapy: e-stim, self massage (theracane, chirp wheel/tennis or golf balls for suboccipitals), dry needling Person educated: Patient Education method: Explanation, Demonstration, and  Handouts Education comprehension: verbalized understanding, returned demonstration, and needs further education  HOME EXERCISE PROGRAM: Access Code: [email or web address] URL: [email or web address] Date: 07/01/2024 Prepared by: Laine Giovanetti April Marie Hamsa Laurich  Exercises - Supine Chin Tuck  - 2-3 x daily - 7 x weekly - 10 reps - 3-5 hold - Supine Scapular Retraction  - 2-3 x daily - 7 x weekly - 10 reps - 3-5 hold  ASSESSMENT:  CLINICAL IMPRESSION: Patient is a 78 y.o. F who was seen today for physical therapy evaluation and treatment for neck and shoulder pain. PMH significant for osteoporosis and history of ACDF C5-6 with chronic history of bilat UE N/T. Assessment is significant for hypomobile C-spine with multiple trigger points throughout her cervical muscles, bilat shoulder and postural weakness (R weaker than L), and postural abnormalities causing issues with reaching and pain. Pt will benefit from PT to improve on these deficits.   OBJECTIVE IMPAIRMENTS: decreased activity tolerance, decreased coordination, decreased endurance, decreased mobility, decreased ROM, decreased strength, hypomobility, increased fascial restrictions, increased muscle spasms, impaired flexibility, impaired sensation, impaired UE functional use, postural dysfunction, and pain.   ACTIVITY LIMITATIONS: carrying, lifting, bathing,  toileting, dressing, self feeding, reach over head, and hygiene/grooming  PARTICIPATION LIMITATIONS: meal prep, cleaning, laundry, driving, and community activity  PERSONAL FACTORS: Age, Fitness, Past/current experiences, and Time since onset of injury/illness/exacerbation are also affecting patient's functional outcome.   REHAB POTENTIAL: Good  CLINICAL DECISION MAKING: Evolving/moderate complexity  EVALUATION COMPLEXITY: Moderate   GOALS: Goals reviewed with patient? Yes  SHORT TERM GOALS: Target date: 07/30/2024   Pt will be ind with initial HEP Baseline:  Goal status: INITIAL  2.  Pt will improve shoulder AROM by at least 10 deg for flexion and abd for overhead reaching Baseline:  Goal status: INITIAL  3.  Pt will report decrease in pain by >/=25% Baseline:  Goal status: INITIAL   LONG TERM GOALS: Target date: 08/27/2024   Pt will be ind with management and progression of HEP Baseline:  Goal status: INITIAL  2.  Pt will demo improved cervical rotation to at least 60 deg for driving tasks Baseline: 22 deg on L, 37 deg on R Goal status: INITIAL  3.  Pt will improve shoulder flexion and abd AROM to >/=140 deg for reaching into kitchen cabinets Baseline: see ROM chart above Goal status: INITIAL  4.  Pt will report improved pain by >/=50% Baseline:  Goal status: INITIAL  5.  Pt will have improved QuickDASH score to </=42.3 to demo MCID Baseline: 52.3 Goal status: INITIAL    PLAN:  PT FREQUENCY: 2x/week  PT DURATION: 8 weeks  PLANNED INTERVENTIONS: [id number]- PT Re-evaluation, 97750- Physical Performance Testing, 97110-Therapeutic exercises, 97530- Therapeutic activity, [id number]- Neuromuscular re-education, 97535- Self Care, [id number]- Manual therapy, [id number]- Electrical stimulation (unattended), 02987- Traction (mechanical), [id number]- Ionotophoresis 4mg /ml Dexamethasone , 79439 (1-2 muscles), 20561 (3+ muscles)- Dry Needling, Patient/Family education, Taping, Joint  mobilization, Spinal mobilization, Cryotherapy, and Moist heat  PLAN FOR NEXT SESSION: Assess response to HEP. Work on neck, shoulder, and postural strength/mobility. Manual therapy for cervical/thoracic hypomobility and trigger points. Consider trial of TENs, had discussed dry needling (but didn't give handout), theracane/self massage and other pain management techniques.    Kanyah Matsushima April Ma L Algie Westry, PT, DPT 07/01/2024, 3:25 PM

## 2024-07-11 ENCOUNTER — Other Ambulatory Visit: Payer: Self-pay | Admitting: Family Medicine

## 2024-07-11 DIAGNOSIS — R946 Abnormal results of thyroid function studies: Secondary | ICD-10-CM

## 2024-07-28 ENCOUNTER — Encounter: Payer: Self-pay | Admitting: Physical Therapy

## 2024-07-28 ENCOUNTER — Ambulatory Visit (INDEPENDENT_AMBULATORY_CARE_PROVIDER_SITE_OTHER): Admitting: Physical Therapy

## 2024-07-28 DIAGNOSIS — M5459 Other low back pain: Secondary | ICD-10-CM | POA: Diagnosis not present

## 2024-07-28 DIAGNOSIS — R293 Abnormal posture: Secondary | ICD-10-CM

## 2024-07-28 DIAGNOSIS — M5412 Radiculopathy, cervical region: Secondary | ICD-10-CM | POA: Diagnosis not present

## 2024-07-28 NOTE — Therapy (Signed)
 OUTPATIENT PHYSICAL THERAPY CERVICAL TREATMENT   Patient Name: [name] [name] MRN: [id number] DOB:1946/01/02, 78 y.o., female Today's Date: 07/28/2024  END OF SESSION:  PT End of Session - 07/28/24 1119     Visit Number 2    Number of Visits 16    Date for PT Re-Evaluation 08/26/24    Authorization Type [employer] Medicare    PT Start Time 1020    PT Stop Time 1100    PT Time Calculation (min) 40 min    Activity Tolerance Patient tolerated treatment well    Behavior During Therapy WFL for tasks assessed/performed           Past Medical History:  Diagnosis Date   Abnormal Pap smear    Abnormal peripheral vision    RT EYE   Allergy    Anemia    Arthritis    back and neck   Breast cancer (HCC) 1997   right breast   Bruises easily    Cataracts, bilateral    Chronic back pain    Chronic neck pain    Complication of anesthesia    slow to wake up   Family history of adverse reaction to anesthesia    Dad takes a long time to wake up   Frequency of urination    GERD (gastroesophageal reflux disease)    Glaucoma    Goiter    High cholesterol    History of postoperative nausea and vomiting    Hypertension    Hypothyroidism    Neuromuscular disorder (HCC)    Numbness    hands / feet /arms / legs - followed by Dr. [place] - [place]   Osteoporosis    PONV (postoperative nausea and vomiting)    Post-menopausal    Prediabetes    Sexual dysfunction    Shortness of breath    with exertion and Wt gain   Urinary incontinence    Past Surgical History:  Procedure Laterality Date   BIOPSY THYROID      BREAST SURGERY Right    lymph node removed   CATARACT EXTRACTION, BILATERAL  2009,2010   COLONOSCOPY  02/15/2014   Dr.Stark   EYE SURGERY  08/2012   detached retina surg   LAPAROSCOPY  1978   BTL   lumpectomy  1997   Rt   neck fusion  2010   THYROIDECTOMY N/A 05/04/2013   Procedure:  TOTAL THYROIDECTOMY;  Surgeon: Krystal CHRISTELLA Spinner, MD;  Location: [facility];  Service:  General;  Laterality: N/A;   TOTAL HIP ARTHROPLASTY Right 01/02/2021   Procedure: TOTAL HIP ARTHROPLASTY ANTERIOR APPROACH;  Surgeon: Ernie Cough, MD;  Location: [facility];  Service: Orthopedics;  Laterality: Right;  70 mins   TUBAL LIGATION     Patient Active Problem List   Diagnosis Date Noted   Impaired fasting glucose 03/09/2024   Postoperative hypothyroidism 03/09/2024   Essential hypertension 03/09/2024   Osteoporosis 03/09/2024   Senile osteoporosis 09/02/2023   Prediabetes 06/26/2022   Aortic atherosclerosis (HCC) 12/26/2021   Polyp of colon 12/26/2021   Familial hypercholesterolemia 10/28/2021   Osteoarthritis of right hip 01/02/2021   Status post right hip replacement 01/02/2021   Primary open angle glaucoma (POAG) of right eye, mild stage 09/13/2018   Diplopia 11/09/2015   Pseudophakia of both eyes 11/09/2015   Right sided weakness 10/28/2013   Cervical dystonia 10/28/2013   Bilateral dry eyes 10/04/2013   S/P total thyroidectomy 08/03/2013   Hypothyroidism 08/03/2013   Other forms of retinal detachment(361.89)  01/18/2013   Urinary incontinence, mixed 01/18/2013   Constipation 01/18/2013   HTN (hypertension) 12/28/2012   History of breast cancer 09/16/2012   Right retinal detachment 09/16/2012   Degeneration of lumbar intervertebral disc 05/30/2012   Multiple thyroid  nodules 04/06/2012   Glaucoma 03/19/2012   Former smoker 03/19/2012   Chronic pain following surgery or procedure 03/19/2012   Mammographic calcification 03/19/2012   Anemia    Cataracts, bilateral    Dyslipidemia (high LDL; low HDL) 09/30/2011   Thyroiditis 09/25/2010   Neck pain, musculoskeletal 07/04/2010   History of detached retina repair 07/04/2010   Cervical cord compression with myelopathy (HCC) 09/28/2009   Family hx of colon cancer 08/15/2009   Chronic back pain 08/15/2009   S/P breast lumpectomy 08/15/2009   Malignant neoplasm of breast (HCC) 04/03/2009   Hyperlipidemia 04/03/2009    Arthritis 04/03/2009   Goiter 04/03/2009    PCP: Wendolyn Jenkins Jansky, MD  REFERRING PROVIDER: Ulis Bottcher, PA-C  REFERRING DIAG: Z98.1 (ICD-10-CM) - S/P cervical spinal fusion M54.2 (ICD-10-CM) - Neck pain  THERAPY DIAG:  Radiculopathy, cervical region  Abnormal posture  Other low back pain  Rationale for Evaluation and Treatment: Rehabilitation  ONSET DATE: Flare up ~1-2 weeks ago  SUBJECTIVE:                                                                                                                                                                                                         SUBJECTIVE STATEMENT: Pt has appt with neurosurgeon on  9/22.   Overall, neck/shoulders bothering her, feels neck is a bit better than at previous visit. She has a hard time standing, cooking, meal prep due to pain. She also has pain in back and weakness in legs that she feels is limiting for standing activity. She feels arms are very weak, and R hip also weak from previous THA.     Pt states she has a rod in her neck in 2010. Reports it's been hurting a lot lately. Pt states she was in a lot of pain -- was doing more than usual at the time. Pt is concerned about her diagnosis with osteoporosis. Had the operation when she was in [place]  and didn't get a lot of follow up. Not sure what she is allowed to do. Was told not to reach up high or hyperextend but not sure if it's for the rest of her life or not. Does have chronic N/T in bilat hands but more on the right. Reports it may be from a disc issue. Does have pain across  her back and notes weakness. Has been having more frequent headaches.   Hand dominance: Right  PERTINENT HISTORY:  Osteoporosis, ACDF 2010  PAIN:  Are you having pain? Yes: NPRS scale: 6 currently, at worst 10 Pain location: Back of neck and top of shoulders Pain description: Numbness, constant irritating nagging pain Aggravating factors: doing too much, reaching Relieving  factors: new supplements, rest  PRECAUTIONS: None  RED FLAGS: None     WEIGHT BEARING RESTRICTIONS: No  FALLS:  Has patient fallen in last 6 months? No  LIVING ENVIRONMENT: Lives with: lives alone Lives in: House/apartment Stairs: No Has following equipment at home: None  OCCUPATION: Retired  PLOF: Independent  PATIENT GOALS: Improve neck pain and reaching  NEXT MD VISIT: 08/11/24 neurosurgeon  OBJECTIVE:  Note: Objective measures were completed at Evaluation unless otherwise noted.  DIAGNOSTIC FINDINGS:  06/16/24 Cervical x-ray IMPRESSION: 1. Anterior fusion plate at R4-R3 without evidence for hardware loosening or acute fracture. 2. Mild degenerative endplate changes at C4-C5 and C6-C7.  MRI 06/29/24 results pending  PATIENT SURVEYS:  QuickDASH Score: 52.3 / 100 = 52.3 %  COGNITION: Overall cognitive status: Within functional limits for tasks assessed  SENSATION: Chronic N/T in bilat hands  POSTURE: rounded shoulders, forward head, and decreased thoracic kyphosis  PALPATION: TTP bilat suboccipitals, throughout cervical and upper thoracic paraspinals, R>L UT, SCMs Hypomobile upper cervical in all directions   CERVICAL ROM:   Active ROM A/PROM (deg) eval 07/28/24   Flexion 26   Extension 15   Right lateral flexion 20   Left lateral flexion 15   Right rotation 22 55  Left rotation 37 65   (Blank rows = not tested)  UPPER EXTREMITY MMT (within available range)  MMT: Right eval Left eval  Shoulder flexion 3 pulls in neck 3+ pulls in neck  Shoulder extension    Shoulder abduction 3 3+  Shoulder adduction    Shoulder extension 4 4  Shoulder internal rotation 3+ 4  Shoulder external rotation 3+ 4  Elbow flexion    Elbow extension    Wrist flexion    Wrist extension    Wrist ulnar deviation    Wrist radial deviation    Wrist pronation    Wrist supination     (Blank rows = not tested)  UPPER EXTREMITY ROM:  AROM Right eval Left eval   Shoulder flexion 100 105  Shoulder extension seated 55 70  Shoulder abduction 110 110  Shoulder adduction    Shoulder extension prone    Shoulder internal rotation T12 T10  Shoulder external rotation 70 70  Middle trapezius    Lower trapezius    Elbow flexion    Elbow extension    Wrist flexion    Wrist extension    Wrist ulnar deviation    Wrist radial deviation    Wrist pronation    Wrist supination    Grip strength     (Blank rows = not tested)  CERVICAL SPECIAL TESTS:  Distraction test: felt good but didn't diminish her N/T  FUNCTIONAL TESTS:    TREATMENT DATE:  07/28/2024 Therapeutic Exercise: Aerobic: Supine: shoulder flexion/aarom/cane x 8 then x 5;   Seated: arom: neck rotation x 5 bil, flexion x 10 Standing: Scap squeeze 2 x 10;  shoulder rolls x 10;  Stretches:  Neuromuscular Re-education: Manual Therapy: STM to bil Uts and cervical paraspinals.  Therapeutic Activity: Self Care:   PATIENT EDUCATION:  Education details: updated and reviewed HEP Person educated: Patient Education method: Explanation, Demonstration, and Handouts Education comprehension: verbalized understanding, returned demonstration, and needs further education  HOME EXERCISE PROGRAM: Access Code: [email or web address]   ASSESSMENT:  CLINICAL IMPRESSION: Pt with good tolerance for activity today. She does have limited shoulder arom due to weakness, will progress next visit. She will benefit from continued education on pain relief strategies, massage, heat, etc. Plan to progress posture and strength as able.   Eval: Patient is a 78 y.o. F who was seen today for physical therapy evaluation and treatment for neck and shoulder pain. PMH significant for osteoporosis and history of ACDF C5-6 with chronic history of bilat UE N/T. Assessment is significant for hypomobile C-spine with  multiple trigger points throughout her cervical muscles, bilat shoulder and postural weakness (R weaker than L), and postural abnormalities causing issues with reaching and pain. Pt will benefit from PT to improve on these deficits.   OBJECTIVE IMPAIRMENTS: decreased activity tolerance, decreased coordination, decreased endurance, decreased mobility, decreased ROM, decreased strength, hypomobility, increased fascial restrictions, increased muscle spasms, impaired flexibility, impaired sensation, impaired UE functional use, postural dysfunction, and pain.   ACTIVITY LIMITATIONS: carrying, lifting, bathing, toileting, dressing, self feeding, reach over head, and hygiene/grooming  PARTICIPATION LIMITATIONS: meal prep, cleaning, laundry, driving, and community activity  PERSONAL FACTORS: Age, Fitness, Past/current experiences, and Time since onset of injury/illness/exacerbation are also affecting patient's functional outcome.   REHAB POTENTIAL: Good  CLINICAL DECISION MAKING: Evolving/moderate complexity  EVALUATION COMPLEXITY: Moderate   GOALS: Goals reviewed with patient? Yes  SHORT TERM GOALS: Target date: 07/30/2024   Pt will be ind with initial HEP Baseline:  Goal status: INITIAL  2.  Pt will improve shoulder AROM by at least 10 deg for flexion and abd for overhead reaching Baseline:  Goal status: INITIAL  3.  Pt will report decrease in pain by >/=25% Baseline:  Goal status: INITIAL   LONG TERM GOALS: Target date: 08/27/2024   Pt will be ind with management and progression of HEP Baseline:  Goal status: INITIAL  2.  Pt will demo improved cervical rotation to at least 60 deg for driving tasks Baseline: 22 deg on L, 37 deg on R Goal status: INITIAL  3.  Pt will improve shoulder flexion and abd AROM to >/=140 deg for reaching into kitchen cabinets Baseline: see ROM chart above Goal status: INITIAL  4.  Pt will report improved pain by >/=50% Baseline:  Goal status:  INITIAL  5.  Pt will have improved QuickDASH score to </=42.3 to demo MCID Baseline: 52.3 Goal status: INITIAL    PLAN:  PT FREQUENCY: 2x/week  PT DURATION: 8 weeks  PLANNED INTERVENTIONS: [id number]- PT Re-evaluation, 97750- Physical Performance Testing, 97110-Therapeutic exercises, 97530- Therapeutic activity, [id number]- Neuromuscular re-education, 97535- Self Care, [id number]- Manual therapy, [id number]- Electrical stimulation (unattended), [phone]- Traction (mechanical), [id number]- Ionotophoresis 4mg /ml Dexamethasone , 79439 (1-2 muscles), 20561 (3+ muscles)- Dry Needling, Patient/Family education, Taping, Joint mobilization, Spinal mobilization, Cryotherapy, and Moist heat  PLAN FOR NEXT SESSION:  shoulder flexion aarom- arom, standing flexion arom or wall slides, rows, bicep curls,  posture /head posture for cooking Wall push ups R  hip strength/flexion.  Assess response to HEP. Work on neck, shoulder, and postural strength/mobility. Manual therapy for cervical/thoracic hypomobility and trigger points.  theracane/self massage and other pain management techniques.    Tinnie Don, PT, DPT 07/28/2024, 11:22 AM

## 2024-08-02 ENCOUNTER — Encounter: Payer: Self-pay | Admitting: Physical Therapy

## 2024-08-02 ENCOUNTER — Ambulatory Visit (INDEPENDENT_AMBULATORY_CARE_PROVIDER_SITE_OTHER): Admitting: Physical Therapy

## 2024-08-02 DIAGNOSIS — M5459 Other low back pain: Secondary | ICD-10-CM

## 2024-08-02 DIAGNOSIS — M5412 Radiculopathy, cervical region: Secondary | ICD-10-CM

## 2024-08-02 DIAGNOSIS — R293 Abnormal posture: Secondary | ICD-10-CM

## 2024-08-02 DIAGNOSIS — R2689 Other abnormalities of gait and mobility: Secondary | ICD-10-CM | POA: Diagnosis not present

## 2024-08-02 NOTE — Therapy (Signed)
 OUTPATIENT PHYSICAL THERAPY CERVICAL TREATMENT   Patient Name: [name] [name] MRN: [id number] DOB:[birth year], 78 y.o., female Today's Date: 08/02/2024  END OF SESSION:  PT End of Session - 08/02/24 0923     Visit Number 3    Number of Visits 16    Date for PT Re-Evaluation 08/26/24    Authorization Type [employer] Medicare    PT Start Time 0932    PT Stop Time 1013    PT Time Calculation (min) 41 min    Activity Tolerance Patient tolerated treatment well    Behavior During Therapy WFL for tasks assessed/performed           Past Medical History:  Diagnosis Date   Abnormal Pap smear    Abnormal peripheral vision    RT EYE   Allergy    Anemia    Arthritis    back and neck   Breast cancer (HCC) 1997   right breast   Bruises easily    Cataracts, bilateral    Chronic back pain    Chronic neck pain    Complication of anesthesia    slow to wake up   Family history of adverse reaction to anesthesia    Dad takes a long time to wake up   Frequency of urination    GERD (gastroesophageal reflux disease)    Glaucoma    Goiter    High cholesterol    History of postoperative nausea and vomiting    Hypertension    Hypothyroidism    Neuromuscular disorder (HCC)    Numbness    hands / feet /arms / legs - followed by Dr. [place] - [place]   Osteoporosis    PONV (postoperative nausea and vomiting)    Post-menopausal    Prediabetes    Sexual dysfunction    Shortness of breath    with exertion and Wt gain   Urinary incontinence    Past Surgical History:  Procedure Laterality Date   BIOPSY THYROID      BREAST SURGERY Right    lymph node removed   CATARACT EXTRACTION, BILATERAL  2009,2010   COLONOSCOPY  02/15/2014   Dr.Stark   EYE SURGERY  08/2012   detached retina surg   LAPAROSCOPY  1978   BTL   lumpectomy  1997   Rt   neck fusion  2010   THYROIDECTOMY N/A 05/04/2013   Procedure:  TOTAL THYROIDECTOMY;  Surgeon: Krystal CHRISTELLA Spinner, MD;  Location: [facility];  Service:  General;  Laterality: N/A;   TOTAL HIP ARTHROPLASTY Right 01/02/2021   Procedure: TOTAL HIP ARTHROPLASTY ANTERIOR APPROACH;  Surgeon: Ernie Cough, MD;  Location: [facility];  Service: Orthopedics;  Laterality: Right;  70 mins   TUBAL LIGATION     Patient Active Problem List   Diagnosis Date Noted   Impaired fasting glucose 03/09/2024   Postoperative hypothyroidism 03/09/2024   Essential hypertension 03/09/2024   Osteoporosis 03/09/2024   Senile osteoporosis 09/02/2023   Prediabetes 06/26/2022   Aortic atherosclerosis (HCC) 12/26/2021   Polyp of colon 12/26/2021   Familial hypercholesterolemia 10/28/2021   Osteoarthritis of right hip 01/02/2021   Status post right hip replacement 01/02/2021   Primary open angle glaucoma (POAG) of right eye, mild stage 09/13/2018   Diplopia 11/09/2015   Pseudophakia of both eyes 11/09/2015   Right sided weakness 10/28/2013   Cervical dystonia 10/28/2013   Bilateral dry eyes 10/04/2013   S/P total thyroidectomy 08/03/2013   Hypothyroidism 08/03/2013   Other forms of retinal detachment(361.89)  01/18/2013   Urinary incontinence, mixed 01/18/2013   Constipation 01/18/2013   HTN (hypertension) 12/28/2012   History of breast cancer 09/16/2012   Right retinal detachment 09/16/2012   Degeneration of lumbar intervertebral disc 05/30/2012   Multiple thyroid  nodules 04/06/2012   Glaucoma 03/19/2012   Former smoker 03/19/2012   Chronic pain following surgery or procedure 03/19/2012   Mammographic calcification 03/19/2012   Anemia    Cataracts, bilateral    Dyslipidemia (high LDL; low HDL) 09/30/2011   Thyroiditis 09/25/2010   Neck pain, musculoskeletal 07/04/2010   History of detached retina repair 07/04/2010   Cervical cord compression with myelopathy (HCC) 09/28/2009   Family hx of colon cancer 08/15/2009   Chronic back pain 08/15/2009   S/P breast lumpectomy 08/15/2009   Malignant neoplasm of breast (HCC) 04/03/2009   Hyperlipidemia 04/03/2009    Arthritis 04/03/2009   Goiter 04/03/2009    PCP: Wendolyn Jenkins Jansky, MD  REFERRING PROVIDER: Ulis Bottcher, PA-C  REFERRING DIAG: Z98.1 (ICD-10-CM) - S/P cervical spinal fusion M54.2 (ICD-10-CM) - Neck pain  THERAPY DIAG:  Radiculopathy, cervical region  Abnormal posture  Other low back pain  Other abnormalities of gait and mobility  Rationale for Evaluation and Treatment: Rehabilitation  ONSET DATE: Flare up ~1-2 weeks ago  SUBJECTIVE:                                                                                                                                                                                                         SUBJECTIVE STATEMENT: Pt with no new complaints. States stiffness and soreness in neck.    Pt has appt with neurosurgeon on  9/22.   Overall, neck/shoulders bothering her, feels neck is a bit better than at previous visit. She has a hard time standing, cooking, meal prep due to pain. She also has pain in back and weakness in legs that she feels is limiting for standing activity. She feels arms are very weak, and R hip also weak from previous THA.     Pt states she has a rod in her neck in 2010. Reports it's been hurting a lot lately. Pt states she was in a lot of pain -- was doing more than usual at the time. Pt is concerned about her diagnosis with osteoporosis. Had the operation when she was in [place]  and didn't get a lot of follow up. Not sure what she is allowed to do. Was told not to reach up high or hyperextend but not sure if it's for the rest of her life or not. Does have chronic  N/T in bilat hands but more on the right. Reports it may be from a disc issue. Does have pain across her back and notes weakness. Has been having more frequent headaches.   Hand dominance: Right  PERTINENT HISTORY:  Osteoporosis, ACDF 2010  PAIN:  Are you having pain? Yes: NPRS scale: 6 currently, at worst 10 Pain location: Back of neck and top of shoulders Pain  description: Numbness, constant irritating nagging pain Aggravating factors: doing too much, reaching Relieving factors: new supplements, rest  PRECAUTIONS: None  RED FLAGS: None     WEIGHT BEARING RESTRICTIONS: No  FALLS:  Has patient fallen in last 6 months? No  LIVING ENVIRONMENT: Lives with: lives alone Lives in: House/apartment Stairs: No Has following equipment at home: None  OCCUPATION: Retired  PLOF: Independent  PATIENT GOALS: Improve neck pain and reaching  NEXT MD VISIT: 08/11/24 neurosurgeon  OBJECTIVE:  Note: Objective measures were completed at Evaluation unless otherwise noted.  DIAGNOSTIC FINDINGS:  06/16/24 Cervical x-ray IMPRESSION: 1. Anterior fusion plate at R4-R3 without evidence for hardware loosening or acute fracture. 2. Mild degenerative endplate changes at C4-C5 and C6-C7.  MRI 06/29/24 results pending  PATIENT SURVEYS:  QuickDASH Score: 52.3 / 100 = 52.3 %  COGNITION: Overall cognitive status: Within functional limits for tasks assessed  SENSATION: Chronic N/T in bilat hands  POSTURE: rounded shoulders, forward head, and decreased thoracic kyphosis  PALPATION: TTP bilat suboccipitals, throughout cervical and upper thoracic paraspinals, R>L UT, SCMs Hypomobile upper cervical in all directions   CERVICAL ROM:   Active ROM A/PROM (deg) eval 07/28/24   Flexion 26   Extension 15   Right lateral flexion 20   Left lateral flexion 15   Right rotation 22 55  Left rotation 37 65   (Blank rows = not tested)  UPPER EXTREMITY MMT (within available range)  MMT: Right eval Left eval  Shoulder flexion 3 pulls in neck 3+ pulls in neck  Shoulder extension    Shoulder abduction 3 3+  Shoulder adduction    Shoulder extension 4 4  Shoulder internal rotation 3+ 4  Shoulder external rotation 3+ 4  Elbow flexion    Elbow extension    Wrist flexion    Wrist extension    Wrist ulnar deviation    Wrist radial deviation    Wrist  pronation    Wrist supination     (Blank rows = not tested)  UPPER EXTREMITY ROM:  AROM Right eval Left eval  Shoulder flexion 100 105  Shoulder extension seated 55 70  Shoulder abduction 110 110  Shoulder adduction    Shoulder extension prone    Shoulder internal rotation T12 T10  Shoulder external rotation 70 70  Middle trapezius    Lower trapezius    Elbow flexion    Elbow extension    Wrist flexion    Wrist extension    Wrist ulnar deviation    Wrist radial deviation    Wrist pronation    Wrist supination    Grip strength     (Blank rows = not tested)  CERVICAL SPECIAL TESTS:  Distraction test: felt good but didn't diminish her N/T  FUNCTIONAL TESTS:    TREATMENT DATE:  08/02/2024 Therapeutic Exercise: Aerobic: Supine: shoulder flexion/aarom/cane 2 x 8  Seated: arom: neck rotation x 10 bil,  rows RTB 2 x 8 ;  pulley/flexion x 10;  Standing: Scap squeeze 2 x 10;   Bicep curls 3 lb 2 x 8 (had to sit down) ; Wall slides x 10 bil;  Stretches:  Neuromuscular Re-education: Manual Therapy: STM/TPR   to R UT and paraspinals  Therapeutic Activity: Self Care:   Therapeutic Exercise: Aerobic: Supine: shoulder flexion/aarom/cane x 8 then x 5;   Seated: arom: neck rotation x 5 bil, flexion x 10 Standing: Scap squeeze 2 x 10;  shoulder rolls x 10;  Stretches:  Neuromuscular Re-education: Manual Therapy: STM to bil Uts and cervical paraspinals.  Therapeutic Activity: Self Care:   PATIENT EDUCATION:  Education details: updated and reviewed HEP Person educated: Patient Education method: Explanation, Demonstration, and Handouts Education comprehension: verbalized understanding, returned demonstration, and needs further education  HOME EXERCISE PROGRAM: Access Code: [email or web address]   ASSESSMENT:  CLINICAL IMPRESSION: Pt with good tolerance for  activity today. She is challenged with UE ther ex and is fatigued, due to weakness, but will benefit from progressive strength as tolerated. Plan to work more on neck pain next visit. Updated and reviewed HEP.   Eval: Patient is a 78 y.o. F who was seen today for physical therapy evaluation and treatment for neck and shoulder pain. PMH significant for osteoporosis and history of ACDF C5-6 with chronic history of bilat UE N/T. Assessment is significant for hypomobile C-spine with multiple trigger points throughout her cervical muscles, bilat shoulder and postural weakness (R weaker than L), and postural abnormalities causing issues with reaching and pain. Pt will benefit from PT to improve on these deficits.   OBJECTIVE IMPAIRMENTS: decreased activity tolerance, decreased coordination, decreased endurance, decreased mobility, decreased ROM, decreased strength, hypomobility, increased fascial restrictions, increased muscle spasms, impaired flexibility, impaired sensation, impaired UE functional use, postural dysfunction, and pain.   ACTIVITY LIMITATIONS: carrying, lifting, bathing, toileting, dressing, self feeding, reach over head, and hygiene/grooming  PARTICIPATION LIMITATIONS: meal prep, cleaning, laundry, driving, and community activity  PERSONAL FACTORS: Age, Fitness, Past/current experiences, and Time since onset of injury/illness/exacerbation are also affecting patient's functional outcome.   REHAB POTENTIAL: Good  CLINICAL DECISION MAKING: Evolving/moderate complexity  EVALUATION COMPLEXITY: Moderate   GOALS: Goals reviewed with patient? Yes  SHORT TERM GOALS: Target date: 07/30/2024   Pt will be ind with initial HEP Baseline:  Goal status: INITIAL  2.  Pt will improve shoulder AROM by at least 10 deg for flexion and abd for overhead reaching Baseline:  Goal status: INITIAL  3.  Pt will report decrease in pain by >/=25% Baseline:  Goal status: INITIAL   LONG TERM GOALS:  Target date: 08/27/2024   Pt will be ind with management and progression of HEP Baseline:  Goal status: INITIAL  2.  Pt will demo improved cervical rotation to at least 60 deg for driving tasks Baseline: 22 deg on L, 37 deg on R Goal status: INITIAL  3.  Pt will improve shoulder flexion and abd AROM to >/=140 deg for reaching into kitchen cabinets Baseline: see ROM chart above Goal status: INITIAL  4.  Pt will report improved pain by >/=50% Baseline:  Goal status: INITIAL  5.  Pt will have improved QuickDASH score to </=42.3 to demo MCID Baseline: 52.3 Goal status: INITIAL    PLAN:  PT FREQUENCY: 2x/week  PT DURATION: 8 weeks  PLANNED INTERVENTIONS: 02835- PT Re-evaluation, 97750-  Physical Performance Testing, 97110-Therapeutic exercises, 97530- Therapeutic activity, [id number]- Neuromuscular re-education, [phone]- Self Care, 02859- Manual therapy, [id number]- Electrical stimulation (unattended), [phone]- Traction (mechanical), [phone]- Ionotophoresis 4mg /ml Dexamethasone , 79439 (1-2 muscles), [id number] (3+ muscles)- Dry Needling, Patient/Family education, Taping, Joint mobilization, Spinal mobilization, Cryotherapy, and Moist heat  PLAN FOR NEXT SESSION:  shoulder flexion aarom- arom, standing flexion arom or wall slides, rows, bicep curls,  posture /head posture for cooking Wall push ups R hip strength/flexion.  Assess response to HEP. Work on neck, shoulder, and postural strength/mobility. Manual therapy for cervical/thoracic hypomobility and trigger points.  theracane/self massage and other pain management techniques.    Tinnie Don, PT, DPT 08/02/2024, 11:59 AM

## 2024-08-04 ENCOUNTER — Ambulatory Visit (INDEPENDENT_AMBULATORY_CARE_PROVIDER_SITE_OTHER): Admitting: Physical Therapy

## 2024-08-04 ENCOUNTER — Encounter: Payer: Self-pay | Admitting: Physical Therapy

## 2024-08-04 DIAGNOSIS — M5412 Radiculopathy, cervical region: Secondary | ICD-10-CM

## 2024-08-04 DIAGNOSIS — R2689 Other abnormalities of gait and mobility: Secondary | ICD-10-CM | POA: Diagnosis not present

## 2024-08-04 DIAGNOSIS — R293 Abnormal posture: Secondary | ICD-10-CM | POA: Diagnosis not present

## 2024-08-04 DIAGNOSIS — M5459 Other low back pain: Secondary | ICD-10-CM

## 2024-08-04 NOTE — Therapy (Signed)
 OUTPATIENT PHYSICAL THERAPY CERVICAL TREATMENT   Patient Name: [name] [name] MRN: [id number] DOB:1945-12-26, 78 y.o., female Today's Date: 08/04/2024  END OF SESSION:  PT End of Session - 08/04/24 1125     Visit Number 4    Number of Visits 16    Date for PT Re-Evaluation 08/26/24    Authorization Type [employer] Medicare    PT Start Time 0933    PT Stop Time 1015    PT Time Calculation (min) 42 min    Activity Tolerance Patient tolerated treatment well    Behavior During Therapy WFL for tasks assessed/performed            Past Medical History:  Diagnosis Date   Abnormal Pap smear    Abnormal peripheral vision    RT EYE   Allergy    Anemia    Arthritis    back and neck   Breast cancer (HCC) 1997   right breast   Bruises easily    Cataracts, bilateral    Chronic back pain    Chronic neck pain    Complication of anesthesia    slow to wake up   Family history of adverse reaction to anesthesia    Dad takes a long time to wake up   Frequency of urination    GERD (gastroesophageal reflux disease)    Glaucoma    Goiter    High cholesterol    History of postoperative nausea and vomiting    Hypertension    Hypothyroidism    Neuromuscular disorder (HCC)    Numbness    hands / feet /arms / legs - followed by Dr. [place] - [place]   Osteoporosis    PONV (postoperative nausea and vomiting)    Post-menopausal    Prediabetes    Sexual dysfunction    Shortness of breath    with exertion and Wt gain   Urinary incontinence    Past Surgical History:  Procedure Laterality Date   BIOPSY THYROID      BREAST SURGERY Right    lymph node removed   CATARACT EXTRACTION, BILATERAL  2009,2010   COLONOSCOPY  02/15/2014   Dr.Stark   EYE SURGERY  08/2012   detached retina surg   LAPAROSCOPY  1978   BTL   lumpectomy  1997   Rt   neck fusion  2010   THYROIDECTOMY N/A 05/04/2013   Procedure:  TOTAL THYROIDECTOMY;  Surgeon: Krystal CHRISTELLA Spinner, MD;  Location: [facility];  Service:  General;  Laterality: N/A;   TOTAL HIP ARTHROPLASTY Right 01/02/2021   Procedure: TOTAL HIP ARTHROPLASTY ANTERIOR APPROACH;  Surgeon: Ernie Cough, MD;  Location: [facility];  Service: Orthopedics;  Laterality: Right;  70 mins   TUBAL LIGATION     Patient Active Problem List   Diagnosis Date Noted   Impaired fasting glucose 03/09/2024   Postoperative hypothyroidism 03/09/2024   Essential hypertension 03/09/2024   Osteoporosis 03/09/2024   Senile osteoporosis 09/02/2023   Prediabetes 06/26/2022   Aortic atherosclerosis (HCC) 12/26/2021   Polyp of colon 12/26/2021   Familial hypercholesterolemia 10/28/2021   Osteoarthritis of right hip 01/02/2021   Status post right hip replacement 01/02/2021   Primary open angle glaucoma (POAG) of right eye, mild stage 09/13/2018   Diplopia 11/09/2015   Pseudophakia of both eyes 11/09/2015   Right sided weakness 10/28/2013   Cervical dystonia 10/28/2013   Bilateral dry eyes 10/04/2013   S/P total thyroidectomy 08/03/2013   Hypothyroidism 08/03/2013   Other forms of retinal  detachment(361.89) 01/18/2013   Urinary incontinence, mixed 01/18/2013   Constipation 01/18/2013   HTN (hypertension) 12/28/2012   History of breast cancer 09/16/2012   Right retinal detachment 09/16/2012   Degeneration of lumbar intervertebral disc 05/30/2012   Multiple thyroid  nodules 04/06/2012   Glaucoma 03/19/2012   Former smoker 03/19/2012   Chronic pain following surgery or procedure 03/19/2012   Mammographic calcification 03/19/2012   Anemia    Cataracts, bilateral    Dyslipidemia (high LDL; low HDL) 09/30/2011   Thyroiditis 09/25/2010   Neck pain, musculoskeletal 07/04/2010   History of detached retina repair 07/04/2010   Cervical cord compression with myelopathy (HCC) 09/28/2009   Family hx of colon cancer 08/15/2009   Chronic back pain 08/15/2009   S/P breast lumpectomy 08/15/2009   Malignant neoplasm of breast (HCC) 04/03/2009   Hyperlipidemia 04/03/2009    Arthritis 04/03/2009   Goiter 04/03/2009    PCP: Wendolyn Jenkins Jansky, MD  REFERRING PROVIDER: Ulis Bottcher, PA-C  REFERRING DIAG: Z98.1 (ICD-10-CM) - S/P cervical spinal fusion M54.2 (ICD-10-CM) - Neck pain  THERAPY DIAG:  Radiculopathy, cervical region  Abnormal posture  Other low back pain  Other abnormalities of gait and mobility  Rationale for Evaluation and Treatment: Rehabilitation  ONSET DATE: Flare up ~1-2 weeks ago  SUBJECTIVE:                                                                                                                                                                                                         SUBJECTIVE STATEMENT: Pt states soreness in her back today, did set up for bunko yesterday, moving tables and chairs, cleaning. Neck is stiff and R hip weak/sore.    Pt has appt with neurosurgeon on  9/22.   Overall, neck/shoulders bothering her, feels neck is a bit better than at previous visit. She has a hard time standing, cooking, meal prep due to pain. She also has pain in back and weakness in legs that she feels is limiting for standing activity. She feels arms are very weak, and R hip also weak from previous THA.     Pt states she has a rod in her neck in 2010. Reports it's been hurting a lot lately. Pt states she was in a lot of pain -- was doing more than usual at the time. Pt is concerned about her diagnosis with osteoporosis. Had the operation when she was in [place]  and didn't get a lot of follow up. Not sure what she is allowed to do. Was told not to reach up high or hyperextend but  not sure if it's for the rest of her life or not. Does have chronic N/T in bilat hands but more on the right. Reports it may be from a disc issue. Does have pain across her back and notes weakness. Has been having more frequent headaches.   Hand dominance: Right  PERTINENT HISTORY:  Osteoporosis, ACDF 2010  PAIN:  Are you having pain? Yes: NPRS scale: 6  currently, at worst 10 Pain location: Back of neck and top of shoulders Pain description: Numbness, constant irritating nagging pain Aggravating factors: doing too much, reaching Relieving factors: new supplements, rest  PRECAUTIONS: None  RED FLAGS: None     WEIGHT BEARING RESTRICTIONS: No  FALLS:  Has patient fallen in last 6 months? No  LIVING ENVIRONMENT: Lives with: lives alone Lives in: House/apartment Stairs: No Has following equipment at home: None  OCCUPATION: Retired  PLOF: Independent  PATIENT GOALS: Improve neck pain and reaching  NEXT MD VISIT: 08/11/24 neurosurgeon  OBJECTIVE:  Note: Objective measures were completed at Evaluation unless otherwise noted.  DIAGNOSTIC FINDINGS:  06/16/24 Cervical x-ray IMPRESSION: 1. Anterior fusion plate at R4-R3 without evidence for hardware loosening or acute fracture. 2. Mild degenerative endplate changes at C4-C5 and C6-C7.  MRI 06/29/24 results pending  PATIENT SURVEYS:  QuickDASH Score: 52.3 / 100 = 52.3 %  COGNITION: Overall cognitive status: Within functional limits for tasks assessed  SENSATION: Chronic N/T in bilat hands  POSTURE: rounded shoulders, forward head, and decreased thoracic kyphosis  PALPATION: TTP bilat suboccipitals, throughout cervical and upper thoracic paraspinals, R>L UT, SCMs Hypomobile upper cervical in all directions   CERVICAL ROM:   Active ROM A/PROM (deg) eval 07/28/24   Flexion 26   Extension 15   Right lateral flexion 20   Left lateral flexion 15   Right rotation 22 55  Left rotation 37 65   (Blank rows = not tested)  UPPER EXTREMITY MMT (within available range)  MMT: Right eval Left eval  Shoulder flexion 3 pulls in neck 3+ pulls in neck  Shoulder extension    Shoulder abduction 3 3+  Shoulder adduction    Shoulder extension 4 4  Shoulder internal rotation 3+ 4  Shoulder external rotation 3+ 4  Elbow flexion    Elbow extension    Wrist flexion    Wrist  extension    Wrist ulnar deviation    Wrist radial deviation    Wrist pronation    Wrist supination     (Blank rows = not tested)  UPPER EXTREMITY ROM:  AROM Right eval Left eval  Shoulder flexion 100 105  Shoulder extension seated 55 70  Shoulder abduction 110 110  Shoulder adduction    Shoulder extension prone    Shoulder internal rotation T12 T10  Shoulder external rotation 70 70  Middle trapezius    Lower trapezius    Elbow flexion    Elbow extension    Wrist flexion    Wrist extension    Wrist ulnar deviation    Wrist radial deviation    Wrist pronation    Wrist supination    Grip strength     (Blank rows = not tested)  CERVICAL SPECIAL TESTS:  Distraction test: felt good but didn't diminish her N/T  FUNCTIONAL TESTS:    TREATMENT DATE:  08/04/2024 Therapeutic Exercise: Aerobic: Supine:   hip ER rom x 15;  SKTC 30 sec x 3;      Supine march with TA (for hip and core strength)2 x 10;   hip adduction ball squeeze x 20; Clams RTB x 20;    review of optimal TA contraction x 15;  shoulder flexion/arom  x 10  Seated: arom:  Standing:  Stretches:  Neuromuscular Re-education: Manual Therapy:  STM/TPR  to Bil UT and paraspinals , manual stretching for UT s .  Therapeutic Activity: Self Care:   Therapeutic Exercise: Aerobic: Supine: shoulder flexion/aarom/cane 2 x 8  Seated: arom: neck rotation x 10 bil,  rows RTB 2 x 8 ;  pulley/flexion x 10;  Standing: Scap squeeze 2 x 10;   Bicep curls 3 lb 2 x 8 (had to sit down) ; Wall slides x 10 bil;  Stretches:  Neuromuscular Re-education: Manual Therapy: STM/TPR   to R UT and paraspinals  Therapeutic Activity: Self Care:   Therapeutic Exercise: Aerobic: Supine: shoulder flexion/aarom/cane x 8 then x 5;   Seated: arom: neck rotation x 5 bil, flexion x 10 Standing: Scap squeeze 2 x 10;   shoulder rolls x 10;  Stretches:  Neuromuscular Re-education: Manual Therapy: STM to bil Uts and cervical paraspinals.  Therapeutic Activity: Self Care:   PATIENT EDUCATION:  Education details: updated and reviewed HEP Person educated: Patient Education method: Explanation, Demonstration, and Handouts Education comprehension: verbalized understanding, returned demonstration, and needs further education  HOME EXERCISE PROGRAM: Access Code: [email or web address]   ASSESSMENT:  CLINICAL IMPRESSION: Pt with good tolerance for activity today. She is challenged with strength and mobility of R hip, and will benefit from progressive strength for this. She has improved ability for TA contraction after education an practice, but still difficult. HEP updated to include mobility for hip and back today.    Eval: Patient is a 78 y.o. F who was seen today for physical therapy evaluation and treatment for neck and shoulder pain. PMH significant for osteoporosis and history of ACDF C5-6 with chronic history of bilat UE N/T. Assessment is significant for hypomobile C-spine with multiple trigger points throughout her cervical muscles, bilat shoulder and postural weakness (R weaker than L), and postural abnormalities causing issues with reaching and pain. Pt will benefit from PT to improve on these deficits.   OBJECTIVE IMPAIRMENTS: decreased activity tolerance, decreased coordination, decreased endurance, decreased mobility, decreased ROM, decreased strength, hypomobility, increased fascial restrictions, increased muscle spasms, impaired flexibility, impaired sensation, impaired UE functional use, postural dysfunction, and pain.   ACTIVITY LIMITATIONS: carrying, lifting, bathing, toileting, dressing, self feeding, reach over head, and hygiene/grooming  PARTICIPATION LIMITATIONS: meal prep, cleaning, laundry, driving, and community activity  PERSONAL FACTORS: Age, Fitness, Past/current experiences, and Time since  onset of injury/illness/exacerbation are also affecting patient's functional outcome.   REHAB POTENTIAL: Good  CLINICAL DECISION MAKING: Evolving/moderate complexity  EVALUATION COMPLEXITY: Moderate   GOALS: Goals reviewed with patient? Yes  SHORT TERM GOALS: Target date: 07/30/2024   Pt will be ind with initial HEP Baseline:  Goal status: INITIAL  2.  Pt will improve shoulder AROM by at least 10 deg for flexion and abd for overhead reaching Baseline:  Goal status: INITIAL  3.  Pt will report decrease in pain by >/=25% Baseline:  Goal status: INITIAL   LONG TERM GOALS: Target date: 08/27/2024   Pt will be ind with management and progression of HEP Baseline:  Goal status: INITIAL  2.  Pt will demo  improved cervical rotation to at least 60 deg for driving tasks Baseline: 22 deg on L, 37 deg on R Goal status: INITIAL  3.  Pt will improve shoulder flexion and abd AROM to >/=140 deg for reaching into kitchen cabinets Baseline: see ROM chart above Goal status: INITIAL  4.  Pt will report improved pain by >/=50% Baseline:  Goal status: INITIAL  5.  Pt will have improved QuickDASH score to </=42.3 to demo MCID Baseline: 52.3 Goal status: INITIAL    PLAN:  PT FREQUENCY: 2x/week  PT DURATION: 8 weeks  PLANNED INTERVENTIONS: 02835- PT Re-evaluation, 97750- Physical Performance Testing, 97110-Therapeutic exercises, 97530- Therapeutic activity, [id number]- Neuromuscular re-education, 97535- Self Care, 02859- Manual therapy, G0283- Electrical stimulation (unattended), [phone]- Traction (mechanical), [id number]- Ionotophoresis 4mg /ml Dexamethasone , 79439 (1-2 muscles), 20561 (3+ muscles)- Dry Needling, Patient/Family education, Taping, Joint mobilization, Spinal mobilization, Cryotherapy, and Moist heat  PLAN FOR NEXT SESSION:  shoulder flexion aarom- arom, standing flexion arom or wall slides, rows, bicep curls,  posture /head posture for cooking Wall push ups R hip  strength/flexion.     Tinnie Don, PT, DPT 08/04/2024, 11:26 AM

## 2024-08-09 ENCOUNTER — Encounter: Admitting: Physical Therapy

## 2024-08-11 ENCOUNTER — Encounter: Admitting: Physical Therapy

## 2024-08-11 ENCOUNTER — Ambulatory Visit: Admitting: Neurosurgery

## 2024-08-13 ENCOUNTER — Ambulatory Visit: Payer: Self-pay | Admitting: *Deleted

## 2024-08-13 NOTE — Telephone Encounter (Signed)
 FYI Only or Action Required?: Action required by provider: request for appointment.  Patient was last seen in primary care on 06/23/2024 by Wendolyn Jenkins Jansky, MD.  Called Nurse Triage reporting Fall.  Symptoms began a week ago.  Interventions attempted: Nothing.  Symptoms are: gradually worsening.  Triage Disposition: See PCP When Office is Open (Within 3 Days)  Patient/caregiver understands and will follow disposition?:Patient wants appointment for x-ray- advised per note   Copied from CRM #[id number]. Topic: Clinical - Red Word Triage >> Aug 13, 2024 12:08 PM Franky GRADE wrote: Red Word that prompted transfer to Nurse Triage: Patient experienced a fall on 08/06/2024 and has been feeling lower back pain that has increased since the fall. Reason for Disposition  [1] MODERATE back pain (e.g., interferes with normal activities) AND [2] present > 3 days  Answer Assessment - Initial Assessment Questions Patient fell on buttock 08/06/24 and was not seen for injury. Patient states she is now having lower back pain with movement and thinks she is out of alignment. Patient has scheduled with chiropractor- but she will have to pay for her Xray- she states if she sees her provider/PCP her insurance will pay for it. No open appointment in office and patient has been advised- other option would be Ortho walk-in clinic. Patient would like for PCP to order Xray in office. Patient advised I would send request- but she will need appointment also.   1. ONSET: When did the pain begin? (e.g., minutes, hours, days)     08/06/24 2. LOCATION: Where does it hurt? (upper, mid or lower back)     Lower back-R 3. SEVERITY: How bad is the pain?  (e.g., Scale 1-10; mild, moderate, or severe)     2/10- sitting, increases with activity 7/10 4. PATTERN: Is the pain constant? (e.g., yes, no; constant, intermittent)      Comes and goes- more with activity  6. CAUSE:  What do you think is causing the back pain?       Recent fall 7. BACK OVERUSE:  Any recent lifting of heavy objects, strenuous work or exercise?     na  Protocols used: Back Pain-A-AH

## 2024-08-16 ENCOUNTER — Encounter: Payer: Self-pay | Admitting: Neurosurgery

## 2024-08-16 ENCOUNTER — Encounter: Payer: Self-pay | Admitting: Physical Therapy

## 2024-08-16 ENCOUNTER — Ambulatory Visit (INDEPENDENT_AMBULATORY_CARE_PROVIDER_SITE_OTHER): Admitting: Physical Therapy

## 2024-08-16 ENCOUNTER — Ambulatory Visit: Admitting: Neurosurgery

## 2024-08-16 VITALS — BP 144/84 | Ht 63.0 in | Wt 159.0 lb

## 2024-08-16 DIAGNOSIS — M5459 Other low back pain: Secondary | ICD-10-CM | POA: Diagnosis not present

## 2024-08-16 DIAGNOSIS — Z981 Arthrodesis status: Secondary | ICD-10-CM

## 2024-08-16 DIAGNOSIS — M5412 Radiculopathy, cervical region: Secondary | ICD-10-CM

## 2024-08-16 DIAGNOSIS — R293 Abnormal posture: Secondary | ICD-10-CM | POA: Diagnosis not present

## 2024-08-16 DIAGNOSIS — R2689 Other abnormalities of gait and mobility: Secondary | ICD-10-CM | POA: Diagnosis not present

## 2024-08-16 DIAGNOSIS — M542 Cervicalgia: Secondary | ICD-10-CM | POA: Diagnosis not present

## 2024-08-16 NOTE — Therapy (Signed)
 OUTPATIENT PHYSICAL THERAPY CERVICAL TREATMENT   Patient Name: [name] [name] MRN: [id number] DOB:December 02, 1945, 78 y.o., female Today's Date: 08/16/2024  END OF SESSION:  PT End of Session - 08/16/24 0930     Visit Number 5    Number of Visits 16    Date for Recertification  08/26/24    Authorization Type [employer] Medicare    PT Start Time 0932    PT Stop Time 1015    PT Time Calculation (min) 43 min    Activity Tolerance Patient tolerated treatment well    Behavior During Therapy WFL for tasks assessed/performed            Past Medical History:  Diagnosis Date   Abnormal Pap smear    Abnormal peripheral vision    RT EYE   Allergy    Anemia    Arthritis    back and neck   Breast cancer (HCC) 1997   right breast   Bruises easily    Cataracts, bilateral    Chronic back pain    Chronic neck pain    Complication of anesthesia    slow to wake up   Family history of adverse reaction to anesthesia    Dad takes a long time to wake up   Frequency of urination    GERD (gastroesophageal reflux disease)    Glaucoma    Goiter    High cholesterol    History of postoperative nausea and vomiting    Hypertension    Hypothyroidism    Neuromuscular disorder (HCC)    Numbness    hands / feet /arms / legs - followed by Dr. [place] - [place]   Osteoporosis    PONV (postoperative nausea and vomiting)    Post-menopausal    Prediabetes    Sexual dysfunction    Shortness of breath    with exertion and Wt gain   Urinary incontinence    Past Surgical History:  Procedure Laterality Date   BIOPSY THYROID      BREAST SURGERY Right    lymph node removed   CATARACT EXTRACTION, BILATERAL  2009,2010   COLONOSCOPY  02/15/2014   Dr.Stark   EYE SURGERY  08/2012   detached retina surg   LAPAROSCOPY  1978   BTL   lumpectomy  1997   Rt   neck fusion  2010   THYROIDECTOMY N/A 05/04/2013   Procedure:  TOTAL THYROIDECTOMY;  Surgeon: Krystal CHRISTELLA Spinner, MD;  Location: [facility];  Service:  General;  Laterality: N/A;   TOTAL HIP ARTHROPLASTY Right 01/02/2021   Procedure: TOTAL HIP ARTHROPLASTY ANTERIOR APPROACH;  Surgeon: Ernie Cough, MD;  Location: [facility];  Service: Orthopedics;  Laterality: Right;  70 mins   TUBAL LIGATION     Patient Active Problem List   Diagnosis Date Noted   Impaired fasting glucose 03/09/2024   Postoperative hypothyroidism 03/09/2024   Essential hypertension 03/09/2024   Osteoporosis 03/09/2024   Senile osteoporosis 09/02/2023   Prediabetes 06/26/2022   Aortic atherosclerosis (HCC) 12/26/2021   Polyp of colon 12/26/2021   Familial hypercholesterolemia 10/28/2021   Osteoarthritis of right hip 01/02/2021   Status post right hip replacement 01/02/2021   Primary open angle glaucoma (POAG) of right eye, mild stage 09/13/2018   Diplopia 11/09/2015   Pseudophakia of both eyes 11/09/2015   Right sided weakness 10/28/2013   Cervical dystonia 10/28/2013   Bilateral dry eyes 10/04/2013   S/P total thyroidectomy 08/03/2013   Hypothyroidism 08/03/2013   Other forms of retinal  detachment(361.89) 01/18/2013   Urinary incontinence, mixed 01/18/2013   Constipation 01/18/2013   HTN (hypertension) 12/28/2012   History of breast cancer 09/16/2012   Right retinal detachment 09/16/2012   Degeneration of lumbar intervertebral disc 05/30/2012   Multiple thyroid  nodules 04/06/2012   Glaucoma 03/19/2012   Former smoker 03/19/2012   Chronic pain following surgery or procedure 03/19/2012   Mammographic calcification 03/19/2012   Anemia    Cataracts, bilateral    Dyslipidemia (high LDL; low HDL) 09/30/2011   Thyroiditis 09/25/2010   Neck pain, musculoskeletal 07/04/2010   History of detached retina repair 07/04/2010   Cervical cord compression with myelopathy (HCC) 09/28/2009   Family hx of colon cancer 08/15/2009   Chronic back pain 08/15/2009   S/P breast lumpectomy 08/15/2009   Malignant neoplasm of breast (HCC) 04/03/2009   Hyperlipidemia 04/03/2009    Arthritis 04/03/2009   Goiter 04/03/2009    PCP: Wendolyn Jenkins Jansky, MD  REFERRING PROVIDER: Ulis Bottcher, PA-C  REFERRING DIAG: Z98.1 (ICD-10-CM) - S/P cervical spinal fusion M54.2 (ICD-10-CM) - Neck pain  THERAPY DIAG:  Radiculopathy, cervical region  Abnormal posture  Other low back pain  Other abnormalities of gait and mobility  Rationale for Evaluation and Treatment: Rehabilitation  ONSET DATE: Flare up ~1-2 weeks ago  SUBJECTIVE:                                                                                                                                                                                                         SUBJECTIVE STATEMENT: Pt went on vacation, fell when she was getting off escalator. Did have some soreness in glutes, but now soreness is feeling better. She does feel like hips are off. Was going to go to chiropractor- will go back in another week or so.  Neck feels very stiff.   Pt has appt with neurosurgeon on  9/22.   Overall, neck/shoulders bothering her, feels neck is a bit better than at previous visit. She has a hard time standing, cooking, meal prep due to pain. She also has pain in back and weakness in legs that she feels is limiting for standing activity. She feels arms are very weak, and R hip also weak from previous THA.     Pt states she has a rod in her neck in 2010. Reports it's been hurting a lot lately. Pt states she was in a lot of pain -- was doing more than usual at the time. Pt is concerned about her diagnosis with osteoporosis. Had the operation when she was in [place]  and didn't get  a lot of follow up. Not sure what she is allowed to do. Was told not to reach up high or hyperextend but not sure if it's for the rest of her life or not. Does have chronic N/T in bilat hands but more on the right. Reports it may be from a disc issue. Does have pain across her back and notes weakness. Has been having more frequent headaches.   Hand  dominance: Right  PERTINENT HISTORY:  Osteoporosis, ACDF 2010  PAIN:  Are you having pain? Yes: NPRS scale: 6 currently, at worst 10 Pain location: Back of neck and top of shoulders Pain description: Numbness, constant irritating nagging pain Aggravating factors: doing too much, reaching Relieving factors: new supplements, rest  PRECAUTIONS: None  RED FLAGS: None     WEIGHT BEARING RESTRICTIONS: No  FALLS:  Has patient fallen in last 6 months? No  LIVING ENVIRONMENT: Lives with: lives alone Lives in: House/apartment Stairs: No Has following equipment at home: None  OCCUPATION: Retired  PLOF: Independent  PATIENT GOALS: Improve neck pain and reaching  NEXT MD VISIT: 08/11/24 neurosurgeon  OBJECTIVE:  Note: Objective measures were completed at Evaluation unless otherwise noted.  DIAGNOSTIC FINDINGS:  06/16/24 Cervical x-ray IMPRESSION: 1. Anterior fusion plate at R4-R3 without evidence for hardware loosening or acute fracture. 2. Mild degenerative endplate changes at C4-C5 and C6-C7.  MRI 06/29/24 results pending  PATIENT SURVEYS:  QuickDASH Score: 52.3 / 100 = 52.3 %  COGNITION: Overall cognitive status: Within functional limits for tasks assessed  SENSATION: Chronic N/T in bilat hands  POSTURE: rounded shoulders, forward head, and decreased thoracic kyphosis  PALPATION: TTP bilat suboccipitals, throughout cervical and upper thoracic paraspinals, R>L UT, SCMs Hypomobile upper cervical in all directions   CERVICAL ROM:   Active ROM A/PROM (deg) eval 07/28/24   Flexion 26   Extension 15   Right lateral flexion 20   Left lateral flexion 15   Right rotation 22 55  Left rotation 37 65   (Blank rows = not tested)  UPPER EXTREMITY MMT (within available range)  MMT: Right eval Left eval  Shoulder flexion 3 pulls in neck 3+ pulls in neck  Shoulder extension    Shoulder abduction 3 3+  Shoulder adduction    Shoulder extension 4 4  Shoulder  internal rotation 3+ 4  Shoulder external rotation 3+ 4  Elbow flexion    Elbow extension    Wrist flexion    Wrist extension    Wrist ulnar deviation    Wrist radial deviation    Wrist pronation    Wrist supination     (Blank rows = not tested)  UPPER EXTREMITY ROM:  AROM Right eval Left eval  Shoulder flexion 100 105  Shoulder extension seated 55 70  Shoulder abduction 110 110  Shoulder adduction    Shoulder extension prone    Shoulder internal rotation T12 T10  Shoulder external rotation 70 70  Middle trapezius    Lower trapezius    Elbow flexion    Elbow extension    Wrist flexion    Wrist extension    Wrist ulnar deviation    Wrist radial deviation    Wrist pronation    Wrist supination    Grip strength     (Blank rows = not tested)  CERVICAL SPECIAL TESTS:  Distraction test: felt good but didn't diminish her N/T  FUNCTIONAL TESTS:    TREATMENT DATE:  08/16/2024 Therapeutic Exercise: Aerobic: Supine:  Supine march with TA (for hip and core strength)2 x 10;    Clams RTB x 20;   shoulder flexion/arom  x 10 ;  SLR(assisted) x 6 on R (difficult and painful).  Seated: arom:  Standing:  Stretches:  LTR x10;  Neuromuscular Re-education: Manual Therapy:  STM/TPR  to Bil UT and paraspinals , manual stretching for UT s . Light distraction;  Therapeutic Activity: Self Care:   Therapeutic Exercise: Aerobic: Supine: shoulder flexion/aarom/cane 2 x 8  Seated: arom: neck rotation x 10 bil,  rows RTB 2 x 8 ;  pulley/flexion x 10;  Standing: Scap squeeze 2 x 10;   Bicep curls 3 lb 2 x 8 (had to sit down) ; Wall slides x 10 bil;  Stretches:  Neuromuscular Re-education: Manual Therapy: STM/TPR   to R UT and paraspinals  Therapeutic Activity: Self Care:   Therapeutic Exercise: Aerobic: Supine: shoulder flexion/aarom/cane x 8 then x 5;    Seated: arom: neck rotation x 5 bil, flexion x 10 Standing: Scap squeeze 2 x 10;  shoulder rolls x 10;  Stretches:  Neuromuscular Re-education: Manual Therapy: STM to bil Uts and cervical paraspinals.  Therapeutic Activity: Self Care:   PATIENT EDUCATION:  Education details: updated and reviewed HEP Person educated: Patient Education method: Explanation, Demonstration, and Handouts Education comprehension: verbalized understanding, returned demonstration, and needs further education  HOME EXERCISE PROGRAM: Access Code: [email or web address]   ASSESSMENT:  CLINICAL IMPRESSION: Pt with good tolerance for activity today. She is challenged with strength and mobility of R hip, and will benefit from progressive strength for this. Most tension and pain in cervical paraspinals, addressed with manual today. Reviewed UT stretch in supine for muscle tension.    Eval: Patient is a 78 y.o. F who was seen today for physical therapy evaluation and treatment for neck and shoulder pain. PMH significant for osteoporosis and history of ACDF C5-6 with chronic history of bilat UE N/T. Assessment is significant for hypomobile C-spine with multiple trigger points throughout her cervical muscles, bilat shoulder and postural weakness (R weaker than L), and postural abnormalities causing issues with reaching and pain. Pt will benefit from PT to improve on these deficits.   OBJECTIVE IMPAIRMENTS: decreased activity tolerance, decreased coordination, decreased endurance, decreased mobility, decreased ROM, decreased strength, hypomobility, increased fascial restrictions, increased muscle spasms, impaired flexibility, impaired sensation, impaired UE functional use, postural dysfunction, and pain.   ACTIVITY LIMITATIONS: carrying, lifting, bathing, toileting, dressing, self feeding, reach over head, and hygiene/grooming  PARTICIPATION LIMITATIONS: meal prep, cleaning, laundry, driving, and community activity  PERSONAL  FACTORS: Age, Fitness, Past/current experiences, and Time since onset of injury/illness/exacerbation are also affecting patient's functional outcome.   REHAB POTENTIAL: Good  CLINICAL DECISION MAKING: Evolving/moderate complexity  EVALUATION COMPLEXITY: Moderate   GOALS: Goals reviewed with patient? Yes  SHORT TERM GOALS: Target date: 07/30/2024   Pt will be ind with initial HEP Baseline:  Goal status: INITIAL  2.  Pt will improve shoulder AROM by at least 10 deg for flexion and abd for overhead reaching Baseline:  Goal status: INITIAL  3.  Pt will report decrease in pain by >/=25% Baseline:  Goal status: INITIAL   LONG TERM GOALS: Target date: 08/27/2024   Pt will be ind with management and progression of HEP Baseline:  Goal status: INITIAL  2.  Pt will demo improved cervical rotation to at least 60 deg for driving tasks Baseline: 22 deg on L, 37 deg on R Goal  status: INITIAL  3.  Pt will improve shoulder flexion and abd AROM to >/=140 deg for reaching into kitchen cabinets Baseline: see ROM chart above Goal status: INITIAL  4.  Pt will report improved pain by >/=50% Baseline:  Goal status: INITIAL  5.  Pt will have improved QuickDASH score to </=42.3 to demo MCID Baseline: 52.3 Goal status: INITIAL    PLAN:  PT FREQUENCY: 2x/week  PT DURATION: 8 weeks  PLANNED INTERVENTIONS: 02835- PT Re-evaluation, 97750- Physical Performance Testing, 97110-Therapeutic exercises, 97530- Therapeutic activity, [id number]- Neuromuscular re-education, 97535- Self Care, 02859- Manual therapy, [id number]- Electrical stimulation (unattended), 02987- Traction (mechanical), [id number]- Ionotophoresis 4mg /ml Dexamethasone , 79439 (1-2 muscles), 20561 (3+ muscles)- Dry Needling, Patient/Family education, Taping, Joint mobilization, Spinal mobilization, Cryotherapy, and Moist heat  PLAN FOR NEXT SESSION:  shoulder flexion aarom- arom, standing flexion arom or wall slides, rows, bicep curls,  posture  /head posture for cooking Wall push ups R hip strength/flexion.     Tinnie Don, PT, DPT 08/16/2024, 10:51 AM

## 2024-08-16 NOTE — Progress Notes (Signed)
 Referring Physician:  Wendolyn Jenkins Jansky, MD [address] [place],  [place] [address]  Primary Physician:  Wendolyn Jenkins Jansky, MD  History of Present Illness: 08/16/24 Ms. [name] [name] has a history of a C5-6 ACDF approximately 15 years ago.  She recently saw Lyle Butters for worsening neck pain, however thankfully she has worked with physical and Occupational Therapy and has had an improvement in her symptomatology at this point.  Overall she feels like she is continuing to get back to her baseline.  She does have a history of cervical spine surgery 15 years ago when she lived back in [place]  and does have intermittent tightness and/or numbness and tingling.  The numbness and tingling is been present in her hands for over 20 years.  She has not noticed any progressive worsening.  She is not noticed any worsening in her gait.    Past Surgery:  Neck Surgery in 2010  The symptoms are causing a significant impact on the patient's life.   Review of Systems:  A 10 point review of systems is negative, except for the pertinent positives and negatives detailed in the HPI.  Past Medical History: Past Medical History:  Diagnosis Date   Abnormal Pap smear    Abnormal peripheral vision    RT EYE   Allergy    Anemia    Arthritis    back and neck   Breast cancer (HCC) 1997   right breast   Bruises easily    Cataracts, bilateral    Chronic back pain    Chronic neck pain    Complication of anesthesia    slow to wake up   Family history of adverse reaction to anesthesia    Dad takes a long time to wake up   Frequency of urination    GERD (gastroesophageal reflux disease)    Glaucoma    Goiter    High cholesterol    History of postoperative nausea and vomiting    Hypertension    Hypothyroidism    Neuromuscular disorder (HCC)    Numbness    hands / feet /arms / legs - followed by Dr. [place] - [place]   Osteoporosis    PONV (postoperative nausea and vomiting)     Post-menopausal    Prediabetes    Sexual dysfunction    Shortness of breath    with exertion and Wt gain   Urinary incontinence     Past Surgical History: Past Surgical History:  Procedure Laterality Date   BIOPSY THYROID      BREAST SURGERY Right    lymph node removed   CATARACT EXTRACTION, BILATERAL  2009,2010   COLONOSCOPY  02/15/2014   Dr.Stark   EYE SURGERY  08/2012   detached retina surg   LAPAROSCOPY  1978   BTL   lumpectomy  1997   Rt   neck fusion  2010   THYROIDECTOMY N/A 05/04/2013   Procedure:  TOTAL THYROIDECTOMY;  Surgeon: Krystal CHRISTELLA Spinner, MD;  Location: [facility];  Service: General;  Laterality: N/A;   TOTAL HIP ARTHROPLASTY Right 01/02/2021   Procedure: TOTAL HIP ARTHROPLASTY ANTERIOR APPROACH;  Surgeon: Ernie Cough, MD;  Location: [facility];  Service: Orthopedics;  Laterality: Right;  70 mins   TUBAL LIGATION      Allergies: Allergies as of 08/16/2024 - Review Complete 08/16/2024  Allergen Reaction Noted   Denosumab Other (See Comments) 03/09/2024   Erythromycin Nausea And Vomiting and Nausea Only 06/29/2015   Ezetimibe Other (See Comments)  08/08/2020   Latex Other (See Comments) 01/01/2023   Other Itching 09/14/2012   Statins Other (See Comments) 08/08/2020    Medications: Outpatient Encounter Medications as of 08/16/2024  Medication Sig   amLODipine  (NORVASC ) 5 MG tablet Take 1 tablet (5 mg total) by mouth daily.   Biotin w/ Vitamins C & E (HAIR/SKIN/NAILS PO) Take by mouth.   Calcium -Magnesium -Zinc-Vit D3 (CALCIUM -MAGNESIUM -ZINC-D3 PO) Take by mouth.   dorzolamide  (TRUSOPT ) 2 % ophthalmic solution 1 drop into affected eye Ophthalmic twice a day   dorzolamide -timolol  (COSOPT ) 22.3-6.8 MG/ML ophthalmic solution Place 1 drop into both eyes 2 (two) times daily.    Ferrous Gluconate (IRON 27 PO) Take by mouth.   gabapentin  (NEURONTIN ) 300 MG capsule Take 1 capsule (300 mg total) by mouth 3 (three) times daily.   glucosamine-chondroitin 500-400 MG tablet Take  1 tablet by mouth 3 (three) times daily.   irbesartan  (AVAPRO ) 300 MG tablet TAKE 1 TABLET BY MOUTH DAILY   latanoprost  (XALATAN ) 0.005 % ophthalmic solution Place 1 drop into the left eye at bedtime.   levothyroxine  (SYNTHROID ) 75 MCG tablet TAKE 1 TABLET BY MOUTH 5 DAYS A  WEEK AND 1 AND 1/2 TABLETS 2  DAYS A WEEK   Multiple Vitamin (MULTIVITAMIN ADULT) TABS Take 1 tablet by mouth daily.   polyvinyl alcohol (LIQUIFILM TEARS) 1.4 % ophthalmic solution Place 1 drop into both eyes daily as needed (for dry eyes).   rosuvastatin  (CRESTOR ) 5 MG tablet TAKE 1 TABLET BY MOUTH TWICE  WEEKLY   VITAMIN D  PO Take by mouth.   [DISCONTINUED] co-enzyme Q-10 30 MG capsule Take 30 mg by mouth 3 (three) times daily.   No facility-administered encounter medications on file as of 08/16/2024.    Social History: Social History   Tobacco Use   Smoking status: Former    Current packs/day: 0.00    Average packs/day: 0.5 packs/day for 3.0 years (1.5 ttl pk-yrs)    Types: Cigarettes    Start date: 01/24/1966    Quit date: 04/27/1968    Years since quitting: 56.3   Smokeless tobacco: Never   Tobacco comments:    quit over 40 years ago  Vaping Use   Vaping status: Never Used  Substance Use Topics   Alcohol use: Yes    Comment: 1 every other month   Drug use: No    Family Medical History: Family History  Problem Relation Age of Onset   Colon polyps Mother    Hyperlipidemia Mother    Heart disease Mother    Diabetes Mother    Hypertension Mother    Hyperlipidemia Father    Colon cancer Father 65   Hearing loss Sister    Hyperlipidemia Sister    Heart disease Sister    Heart disease Sister    Hyperlipidemia Sister    Stroke Sister    Hypertension Sister    Thyroid  disease Sister    Hyperlipidemia Brother    Drug abuse Brother    Early death Brother    Esophageal cancer Brother 9       smoker   Diabetes Brother    Heart disease Brother    Hypertension Brother    Heart attack Brother     Early death Brother    Colon cancer Paternal Aunt 71   Heart disease Maternal Grandmother    Early death Maternal Grandmother    Diabetes Maternal Grandmother    Asthma Maternal Grandmother    Diabetes Paternal Grandmother    Hypertension Paternal  Grandmother    Heart disease Paternal Grandmother    Breast cancer Paternal Grandmother        dx in her 55s   Heart disease Paternal Grandfather    Depression Daughter    Cervical cancer Daughter        dx in her 69s   Celiac disease Daughter    Rectal cancer Son    Prostate cancer Son    Thyroid  disease Other 30       niece    Physical Examination: NEUROLOGICAL:     Awake, alert, oriented to person, place, and time.  Speech is clear and fluent. Fund of knowledge is appropriate.   Cranial Nerves: Pupils equal round and reactive to light.  Facial tone is symmetric.   ROM of spine: Minimal tenderness palpation of cervical paraspinals  Strength: Side Biceps Triceps Deltoid Interossei Grip Wrist Ext. Wrist Flex.  R 5 5 5 5 5 5 5   L 5 5 5 5 5 5 5     Medical Decision Making  Imaging: EXAM: CERVICAL SPINE - COMPLETE 4+ VIEW; THORACIC SPINE 2 VIEWS   COMPARISON:  06/14/2019.   FINDINGS: Cervical spine: There is no evidence of acute cervical spine fracture or prevertebral soft tissue swelling. Anterior cervical spinal fusion hardware is noted at C5-C6. Alignment is normal. Intervertebral disc space narrowing is noted at C4-C5. The neural foramina are patent on the right. The left neural foramina are not well evaluated. Surgical clips are noted in the cervical soft tissues. Mild facet arthropathy is noted bilaterally.   Thoracic spine: No acute fracture in the thoracic spine. Mild degenerative endplate changes are noted at multiple levels. Aortic atherosclerosis is noted.   IMPRESSION: 1. No acute fracture. 2. Multilevel degenerative changes in the cervical and thoracic spine. 3. Anterior cervical spinal fusion at  C5-C6.  EXAM: CERVICAL SPINE - COMPLETE 4+ VIEW; THORACIC SPINE 2 VIEWS   COMPARISON:  06/14/2019.   FINDINGS: Cervical spine: There is no evidence of acute cervical spine fracture or prevertebral soft tissue swelling. Anterior cervical spinal fusion hardware is noted at C5-C6. Alignment is normal. Intervertebral disc space narrowing is noted at C4-C5. The neural foramina are patent on the right. The left neural foramina are not well evaluated. Surgical clips are noted in the cervical soft tissues. Mild facet arthropathy is noted bilaterally.   Thoracic spine: No acute fracture in the thoracic spine. Mild degenerative endplate changes are noted at multiple levels. Aortic atherosclerosis is noted.   IMPRESSION: 1. No acute fracture. 2. Multilevel degenerative changes in the cervical and thoracic spine. 3. Anterior cervical spinal fusion at C5-C6.   Narrative & Impression  CLINICAL DATA:  Myelopathy, chronic.  Neck pain.  Previous fusion.   EXAM: MRI CERVICAL SPINE WITHOUT CONTRAST   TECHNIQUE: Multiplanar, multisequence MR imaging of the cervical spine was performed. No intravenous contrast was administered.   COMPARISON:  Radiography 06/16/2024   FINDINGS: Alignment: No malalignment.   Vertebrae: Previous ACDF C5-6 which appears solid. No edematous endplate marrow changes or edematous facet arthritis.   Cord: No cord compression. New minimal T2 signal in the cord at the C5-6 level consistent with chronic compressive myelopathy. There is no longer any ongoing compressive disease.   Posterior Fossa, vertebral arteries, paraspinal tissues: Negative   Disc levels:   No abnormality from the foramen magnum through C2-3.   C3-4: Minimal disc bulge.  No stenosis.   C4-5: Endplate osteophytes and bulging of the disc. Narrowing of the subarachnoid space surrounding  the cord but no cord compression AP diameter of the canal in the midline 7.2 mm. Bilateral  foraminal narrowing could affect either C5 nerve.   C5-6: Previous ACDF with solid union and sufficient patency of the canal and foramina. Chronic abnormal T2 signal affecting the cord as noted above, consistent with chronic myelopathy.   C6-7: Bulging of the disc. No compressive canal or foraminal narrowing.   C7-T1: Minimal facet arthritis.  No stenosis.   IMPRESSION: 1. Good appearance at the fusion level of C5-6. No ongoing stenosis. Chronic abnormal T2 signal affecting the cord at this level consistent with chronic sequela of previous compressive myelopathy. 2. C4-5: Endplate osteophytes and bulging of the disc. Narrowing of the subarachnoid space surrounding the cord but no cord compression. AP diameter of the canal in the midline 7.2 mm. Bilateral foraminal narrowing could affect either C5 nerve. 3. Lesser, non-compressive degenerative changes at C3-4, C6-7 and C7-T1.     Electronically Signed   By: Oneil Officer M.D.   On: 07/05/2024 16:48   I have personally reviewed the images and agree with the above interpretation.  Assessment and Plan: Ms. Riga is a pleasant 78 y.o. female has a history of a C5-6 ACDF approximately 15 years ago.  When she last saw Lyle she came in for acute exacerbation in her neck pain.  She was quite concerned at that point.  She had increased her physical activity and had been moving multiple things around her house.  Thankfully the pain has abated and she is back at her baseline.  She is not having any new numbness weakness or tingling.  She does have her baseline numbness weakness and tingling which has been present for many years.    At this point her neck pain has improved significantly.  Her MRI did show some adjacent level disease as expected with an ACDF over 14 years old.  She does not have any progressive myelopathy.  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  We  discussed traction.  She would like to avoid surgery if possible.  I will plan on continuing to follow as needed.  She like to reach out to our team should she have any new exacerbations.  Penne MICAEL Sharps, MD Dept. of Neurosurgery

## 2024-08-18 ENCOUNTER — Encounter: Admitting: Physical Therapy

## 2024-08-18 LAB — HM DEXA SCAN

## 2024-08-20 ENCOUNTER — Encounter: Payer: Self-pay | Admitting: Family Medicine

## 2024-08-22 ENCOUNTER — Ambulatory Visit: Payer: Self-pay | Admitting: Family Medicine

## 2024-08-22 NOTE — Progress Notes (Signed)
 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  Stable.  I know she had side effects to treatment in past.  Does she want to continue as is or see Dr. Joane re tx options/possible osteoporosis exercise program?

## 2024-08-23 ENCOUNTER — Encounter: Payer: Self-pay | Admitting: Physical Therapy

## 2024-08-23 ENCOUNTER — Ambulatory Visit

## 2024-08-23 ENCOUNTER — Encounter: Payer: Self-pay | Admitting: Family Medicine

## 2024-08-23 ENCOUNTER — Ambulatory Visit (INDEPENDENT_AMBULATORY_CARE_PROVIDER_SITE_OTHER): Admitting: Physical Therapy

## 2024-08-23 ENCOUNTER — Ambulatory Visit: Admitting: Family Medicine

## 2024-08-23 ENCOUNTER — Ambulatory Visit: Payer: Self-pay | Admitting: Family Medicine

## 2024-08-23 ENCOUNTER — Ambulatory Visit (INDEPENDENT_AMBULATORY_CARE_PROVIDER_SITE_OTHER)

## 2024-08-23 VITALS — BP 136/80 | HR 68 | Temp 98.4°F | Ht 63.0 in | Wt 158.4 lb

## 2024-08-23 DIAGNOSIS — M545 Low back pain, unspecified: Secondary | ICD-10-CM

## 2024-08-23 DIAGNOSIS — M810 Age-related osteoporosis without current pathological fracture: Secondary | ICD-10-CM | POA: Diagnosis not present

## 2024-08-23 DIAGNOSIS — M5459 Other low back pain: Secondary | ICD-10-CM

## 2024-08-23 DIAGNOSIS — M5412 Radiculopathy, cervical region: Secondary | ICD-10-CM

## 2024-08-23 DIAGNOSIS — R2689 Other abnormalities of gait and mobility: Secondary | ICD-10-CM | POA: Diagnosis not present

## 2024-08-23 DIAGNOSIS — M25551 Pain in right hip: Secondary | ICD-10-CM | POA: Diagnosis not present

## 2024-08-23 DIAGNOSIS — W100XXA Fall (on)(from) escalator, initial encounter: Secondary | ICD-10-CM

## 2024-08-23 NOTE — Therapy (Signed)
 OUTPATIENT PHYSICAL THERAPY CERVICAL TREATMENT   Patient Name: Zaria Taha MRN: [id number] DOB:February 15, 1946, 78 y.o., female Today's Date: 08/23/2024  END OF SESSION:  PT End of Session - 08/23/24 0936     Visit Number 6    Number of Visits 16    Date for Recertification  08/26/24    Authorization Type [employer] Medicare    PT Start Time [id number]    PT Stop Time 1015    PT Time Calculation (min) 39 min    Activity Tolerance Patient tolerated treatment well    Behavior During Therapy WFL for tasks assessed/performed             Past Medical History:  Diagnosis Date   Abnormal Pap smear    Abnormal peripheral vision    RT EYE   Allergy    Anemia    Arthritis    back and neck   Breast cancer (HCC) 1997   right breast   Bruises easily    Cataracts, bilateral    Chronic back pain    Chronic neck pain    Complication of anesthesia    slow to wake up   Family history of adverse reaction to anesthesia    Dad takes a long time to wake up   Frequency of urination    GERD (gastroesophageal reflux disease)    Glaucoma    Goiter    High cholesterol    History of postoperative nausea and vomiting    Hypertension    Hypothyroidism    Neuromuscular disorder (HCC)    Numbness    hands / feet /arms / legs - [place]. [place]   Osteoporosis    PONV (postoperative nausea and vomiting)    Post-menopausal    Prediabetes    Sexual dysfunction    Shortness of breath    with exertion and Wt gain   Urinary incontinence    Past Surgical History:  Procedure Laterality Date   BIOPSY THYROID      BREAST SURGERY Right    lymph node removed   CATARACT EXTRACTION, BILATERAL  2009,2010   COLONOSCOPY  02/15/2014   Dr.Stark   EYE SURGERY  08/2012   detached retina surg   LAPAROSCOPY  1978   BTL   lumpectomy  1997   Rt   neck fusion  2010   THYROIDECTOMY N/A 05/04/2013   Procedure:  TOTAL THYROIDECTOMY;  Surgeon: Krystal CHRISTELLA Spinner, MD;  Location: [facility];  Service:  General;  Laterality: N/A;   TOTAL HIP ARTHROPLASTY Right 01/02/2021   Procedure: TOTAL HIP ARTHROPLASTY ANTERIOR APPROACH;  Surgeon: Ernie Cough, MD;  Location: [facility];  Service: Orthopedics;  Laterality: Right;  70 mins   TUBAL LIGATION     Patient Active Problem List   Diagnosis Date Noted   S/P cervical spinal fusion 08/16/2024   Neck pain 08/16/2024   Impaired fasting glucose 03/09/2024   Postoperative hypothyroidism 03/09/2024   Essential hypertension 03/09/2024   Osteoporosis 03/09/2024   Senile osteoporosis 09/02/2023   Prediabetes 06/26/2022   Aortic atherosclerosis 12/26/2021   Polyp of colon 12/26/2021   Familial hypercholesterolemia 10/28/2021   Osteoarthritis of right hip 01/02/2021   Status post right hip replacement 01/02/2021   Primary open angle glaucoma (POAG) of right eye, mild stage 09/13/2018   Diplopia 11/09/2015   Pseudophakia of both eyes 11/09/2015   Right sided weakness 10/28/2013   Cervical dystonia 10/28/2013   Bilateral dry eyes 10/04/2013   S/P total  thyroidectomy 08/03/2013   Hypothyroidism 08/03/2013   Other forms of retinal detachment(361.89) 01/18/2013   Urinary incontinence, mixed 01/18/2013   Constipation 01/18/2013   HTN (hypertension) 12/28/2012   History of breast cancer 09/16/2012   Right retinal detachment 09/16/2012   Degeneration of lumbar intervertebral disc 05/30/2012   Multiple thyroid  nodules 04/06/2012   Glaucoma 03/19/2012   Former smoker 03/19/2012   Chronic pain following surgery or procedure 03/19/2012   Mammographic calcification 03/19/2012   Anemia    Cataracts, bilateral    Dyslipidemia (high LDL; low HDL) 09/30/2011   Thyroiditis 09/25/2010   Neck pain, musculoskeletal 07/04/2010   History of detached retina repair 07/04/2010   Cervical cord compression with myelopathy (HCC) 09/28/2009   Family hx of colon cancer 08/15/2009   Chronic back pain 08/15/2009   S/P breast lumpectomy 08/15/2009   Malignant neoplasm  of breast (HCC) 04/03/2009   Hyperlipidemia 04/03/2009   Arthritis 04/03/2009   Goiter 04/03/2009    PCP: Wendolyn Jenkins Jansky, MD  REFERRING PROVIDER: Ulis Bottcher, PA-C  REFERRING DIAG: Z98.1 (ICD-10-CM) - S/P cervical spinal fusion M54.2 (ICD-10-CM) - Neck pain  THERAPY DIAG:  No diagnosis found.  Rationale for Evaluation and Treatment: Rehabilitation  ONSET DATE: Flare up ~1-2 weeks ago  SUBJECTIVE:                                                                                                                                                                                                         SUBJECTIVE STATEMENT: Numbness still present in arm, arm feels weak.  States soreness, but feels back , core and hip things have been helpful.     Pt has appt with neurosurgeon on  9/22.   Overall, neck/shoulders bothering her, feels neck is a bit better than at previous visit. She has a hard time standing, cooking, meal prep due to pain. She also has pain in back and weakness in legs that she feels is limiting for standing activity. She feels arms are very weak, and R hip also weak from previous THA.     Pt states she has a rod in her neck in 2010. Reports it's been hurting a lot lately. Pt states she was in a lot of pain -- was doing more than usual at the time. Pt is concerned about her diagnosis with osteoporosis. Had the operation when she was in [place]  and didn't get a lot of follow up. Not sure what she is allowed to do. Was told not to reach up high or hyperextend but not sure if it's for  the rest of her life or not. Does have chronic N/T in bilat hands but more on the right. Reports it may be from a disc issue. Does have pain across her back and notes weakness. Has been having more frequent headaches.   Hand dominance: Right  PERTINENT HISTORY:  Osteoporosis, ACDF 2010  PAIN:  Are you having pain? Yes: NPRS scale: 6 currently, at worst 10 Pain location: Back of neck and top  of shoulders Pain description: Numbness, constant irritating nagging pain Aggravating factors: doing too much, reaching Relieving factors: new supplements, rest  PRECAUTIONS: None  RED FLAGS: None     WEIGHT BEARING RESTRICTIONS: No  FALLS:  Has patient fallen in last 6 months? No  LIVING ENVIRONMENT: Lives with: lives alone Lives in: House/apartment Stairs: No Has following equipment at home: None  OCCUPATION: Retired  PLOF: Independent  PATIENT GOALS: Improve neck pain and reaching  NEXT MD VISIT: 08/11/24 neurosurgeon  OBJECTIVE:  Note: Objective measures were completed at Evaluation unless otherwise noted.  DIAGNOSTIC FINDINGS:  06/16/24 Cervical x-ray IMPRESSION: 1. Anterior fusion plate at R4-R3 without evidence for hardware loosening or acute fracture. 2. Mild degenerative endplate changes at C4-C5 and C6-C7.  MRI 06/29/24 results pending  PATIENT SURVEYS:  QuickDASH Score: Eval: 52.3 / 100 = 52.3 %  COGNITION: Overall cognitive status: Within functional limits for tasks assessed  SENSATION: Chronic N/T in bilat hands  POSTURE: rounded shoulders, forward head, and decreased thoracic kyphosis  PALPATION: TTP bilat suboccipitals, throughout cervical and upper thoracic paraspinals, R>L UT, SCMs Hypomobile upper cervical in all directions   CERVICAL ROM:   Active ROM A/PROM (deg) eval 07/28/24   Flexion 26   Extension 15   Right lateral flexion 20   Left lateral flexion 15   Right rotation 22 55  Left rotation 37 65   (Blank rows = not tested)  UPPER EXTREMITY MMT (within available range)  MMT: Right eval Left eval  Shoulder flexion 3 pulls in neck 3+ pulls in neck  Shoulder extension    Shoulder abduction 3 3+  Shoulder adduction    Shoulder extension 4 4  Shoulder internal rotation 3+ 4  Shoulder external rotation 3+ 4  Elbow flexion    Elbow extension    Wrist flexion    Wrist extension    Wrist ulnar deviation    Wrist radial  deviation    Wrist pronation    Wrist supination     (Blank rows = not tested)   UPPER EXTREMITY ROM:  AROM Right eval Left eval Right 08/23/24 Left 08/23/24  Shoulder flexion 100 105    Shoulder extension seated 55 70    Shoulder abduction 110 110    Shoulder adduction      Shoulder extension prone      Shoulder internal rotation T12 T10    Shoulder external rotation 70 70    Middle trapezius      Lower trapezius      Elbow flexion      Elbow extension      Wrist flexion      Wrist extension      Wrist ulnar deviation      Wrist radial deviation      Wrist pronation      Wrist supination      Grip strength   28 25   (Blank rows = not tested)  CERVICAL SPECIAL TESTS:  Distraction test: felt good but didn't diminish her N/T  FUNCTIONAL TESTS:  TREATMENT DATE:                                                                                                                              08/23/2024 Therapeutic Exercise: Aerobic: Supine:  Supine march with TA (for hip and core strength) 2 x 10;    SLR(assisted) x 6 on R (difficult and painful).  Seated: bicep curls 2 lb x 15 (tolerated better than standing )  S/L:  clams x10 then x 8  on R  Standing:   Rows GTB x 15;  Shoulder ER YTB 2 x 10; Shoulder elevation/arom, 2 x 6  Stretches:  LTR x10;  Neuromuscular Re-education: Manual Therapy:   Therapeutic Activity: Self Care:   Therapeutic Exercise: Aerobic: Supine:  Supine march with TA (for hip and core strength)2 x 10;    Clams RTB x 20;   shoulder flexion/arom  x 10 ;  SLR(assisted) x 6 on R (difficult and painful).  Seated: arom:  Standing:  Stretches:  LTR x10;  Neuromuscular Re-education: Manual Therapy:  STM/TPR  to Bil UT and paraspinals , manual stretching for UT s . Light distraction;  Therapeutic Activity: Self Care:   Therapeutic Exercise: Aerobic: Supine: shoulder flexion/aarom/cane 2 x 8  Seated: arom: neck rotation x 10 bil,  rows RTB 2 x 8 ;   pulley/flexion x 10;  Standing: Scap squeeze 2 x 10;   Bicep curls 3 lb 2 x 8 (had to sit down) ; Wall slides x 10 bil;  Stretches:  Neuromuscular Re-education: Manual Therapy: STM/TPR   to R UT and paraspinals  Therapeutic Activity: Self Care:   Therapeutic Exercise: Aerobic: Supine: shoulder flexion/aarom/cane x 8 then x 5;   Seated: arom: neck rotation x 5 bil, flexion x 10 Standing: Scap squeeze 2 x 10;  shoulder rolls x 10;  Stretches:  Neuromuscular Re-education: Manual Therapy: STM to bil Uts and cervical paraspinals.  Therapeutic Activity: Self Care:   PATIENT EDUCATION:  Education details: updated and reviewed HEP Person educated: Patient Education method: Explanation, Demonstration, and Handouts Education comprehension: verbalized understanding, returned demonstration, and needs further education  HOME EXERCISE PROGRAM: Access Code: [email or web address]   ASSESSMENT:  CLINICAL IMPRESSION: Pt with good tolerance for activity today. She is challenged with strength for UE and R LE, and will benefit from continued education on strengthening. She has improved shoulder elevation arom from Eval. Pt to benefit from continued care.    Eval: Patient is a 78 y.o. F who was seen today for physical therapy evaluation and treatment for neck and shoulder pain. PMH significant for osteoporosis and history of ACDF C5-6 with chronic history of bilat UE N/T. Assessment is significant for hypomobile C-spine with multiple trigger points throughout her cervical muscles, bilat shoulder and postural weakness (R weaker than L), and postural abnormalities causing issues with reaching and pain. Pt will benefit from PT to improve on these deficits.   OBJECTIVE IMPAIRMENTS: decreased activity tolerance, decreased  coordination, decreased endurance, decreased mobility, decreased ROM, decreased strength, hypomobility, increased fascial restrictions, increased muscle spasms, impaired flexibility, impaired  sensation, impaired UE functional use, postural dysfunction, and pain.   ACTIVITY LIMITATIONS: carrying, lifting, bathing, toileting, dressing, self feeding, reach over head, and hygiene/grooming  PARTICIPATION LIMITATIONS: meal prep, cleaning, laundry, driving, and community activity  PERSONAL FACTORS: Age, Fitness, Past/current experiences, and Time since onset of injury/illness/exacerbation are also affecting patient's functional outcome.   REHAB POTENTIAL: Good  CLINICAL DECISION MAKING: Evolving/moderate complexity  EVALUATION COMPLEXITY: Moderate   GOALS: Goals reviewed with patient? Yes  SHORT TERM GOALS: Target date: 07/30/2024   Pt will be ind with initial HEP Baseline:  Goal status: INITIAL  2.  Pt will improve shoulder AROM by at least 10 deg for flexion and abd for overhead reaching Baseline:  Goal status: INITIAL  3.  Pt will report decrease in pain by >/=25% Baseline:  Goal status: INITIAL   LONG TERM GOALS: Target date: 08/27/2024   Pt will be ind with management and progression of HEP Baseline:  Goal status: INITIAL  2.  Pt will demo improved cervical rotation to at least 60 deg for driving tasks Baseline: 22 deg on L, 37 deg on R Goal status: INITIAL  3.  Pt will improve shoulder flexion and abd AROM to >/=140 deg for reaching into kitchen cabinets Baseline: see ROM chart above Goal status: INITIAL  4.  Pt will report improved pain by >/=50% Baseline:  Goal status: INITIAL  5.  Pt will have improved QuickDASH score to </=42.3 to demo MCID Baseline: 52.3 Goal status: INITIAL    PLAN:  PT FREQUENCY: 2x/week  PT DURATION: 8 weeks  PLANNED INTERVENTIONS: 02835- PT Re-evaluation, 97750- Physical Performance Testing, 97110-Therapeutic exercises, 97530- Therapeutic activity, [id number]- Neuromuscular re-education, 97535- Self Care, 02859- Manual therapy, G0283- Electrical stimulation (unattended), 02987- Traction (mechanical), [id number]- Ionotophoresis  4mg /ml Dexamethasone , 79439 (1-2 muscles), 20561 (3+ muscles)- Dry Needling, Patient/Family education, Taping, Joint mobilization, Spinal mobilization, Cryotherapy, and Moist heat  PLAN FOR NEXT SESSION:  shoulder flexion aarom- arom, posture /head posture for cooking Wall push ups,   manual for neck R hip strength/flexion. abd     Tinnie Don, PT, DPT 08/23/2024, 9:37 AM

## 2024-08-23 NOTE — Progress Notes (Signed)
 Spine-no fx.  R hip-possible remote fracture.  Should see her orthopedic doctor(probably old).

## 2024-08-23 NOTE — Progress Notes (Signed)
 Subjective:     Patient ID: [name] [name], female    DOB: 07-25-1946, 78 y.o.   MRN: [id number]  Chief Complaint  Patient presents with   Felton Rasmussen on 9/12, going up escalator with luggage and fell off the top backward and landed on back and bottom, don't feel like I broke anything but just feels like something is off; Santina to chiropractor and was told xray needed before treatment    HPI Discussed the use of AI scribe software for clinical note transcription with the patient, who gave verbal consent to proceed.  History of Present Illness [name] [name] is a 78 year old female with osteoporosis who presents for evaluation of a fall.  She experienced a fall on August 06, 2024, while ascending an [employer] with luggage. Her luggage tipped over, causing her to lose balance and fall onto her buttocks at the top of the escalator. She did not fall down the steps but landed on the cement at the top. The luggage fell to the bottom of the escalator without causing injury.  Following the fall, she felt sore but did not experience significant pain. She did not observe any visible bruises. She felt 'off balance' and 'off' after returning home, prompting her to seek chiropractic care. However, the chiropractor required x-rays before proceeding with any adjustments. Still feeling not right  No head injury, loss of consciousness, numbness, tingling, or shortness of breath. No new bowel or bladder issues have occurred since the fall, although she mentions frequent urination, which she attributes to increased water  intake. She is currently undergoing physical therapy for mobility and strengthening, particularly for her neck, back, and arms.  Her past medical history includes osteoporosis, for which she is taking glucosamine, calcium , and vitamin D . She has a history of hip replacement surgery, which causes some discomfort when lifting her R thigh.. A recent bone density test showed  osteoporosis at the hip and osteopenia at the wrist.    Health Maintenance Due  Topic Date Due   COVID-19 Vaccine (1) Never done   Zoster Vaccines- Shingrix (1 of 2) Never done   Medicare Annual Wellness (AWV)  02/27/2023   Influenza Vaccine  Never done    Past Medical History:  Diagnosis Date   Abnormal Pap smear    Abnormal peripheral vision    RT EYE   Allergy    Anemia    Arthritis    back and neck   Breast cancer (HCC) 1997   right breast   Bruises easily    Cataracts, bilateral    Chronic back pain    Chronic neck pain    Complication of anesthesia    slow to wake up   Family history of adverse reaction to anesthesia    Dad takes a long time to wake up   Frequency of urination    GERD (gastroesophageal reflux disease)    Glaucoma    Goiter    High cholesterol    History of postoperative nausea and vomiting    Hypertension    Hypothyroidism    Neuromuscular disorder (HCC)    Numbness    hands / feet /arms / legs - followed by Dr. [place] - [place]   Osteoporosis    PONV (postoperative nausea and vomiting)    Post-menopausal    Prediabetes    Sexual dysfunction    Shortness of breath    with exertion and Wt gain   Urinary incontinence  Past Surgical History:  Procedure Laterality Date   BIOPSY THYROID      BREAST SURGERY Right    lymph node removed   CATARACT EXTRACTION, BILATERAL  2009,2010   COLONOSCOPY  02/15/2014   Dr.Stark   EYE SURGERY  08/2012   detached retina surg   LAPAROSCOPY  1978   BTL   lumpectomy  1997   Rt   neck fusion  2010   THYROIDECTOMY N/A 05/04/2013   Procedure:  TOTAL THYROIDECTOMY;  Surgeon: Krystal CHRISTELLA Spinner, MD;  Location: [facility];  Service: General;  Laterality: N/A;   TOTAL HIP ARTHROPLASTY Right 01/02/2021   Procedure: TOTAL HIP ARTHROPLASTY ANTERIOR APPROACH;  Surgeon: Ernie Cough, MD;  Location: [facility];  Service: Orthopedics;  Laterality: Right;  70 mins   TUBAL LIGATION       Current Outpatient  Medications:    amLODipine  (NORVASC ) 5 MG tablet, Take 1 tablet (5 mg total) by mouth daily., Disp: 90 tablet, Rfl: 3   Biotin w/ Vitamins C & E (HAIR/SKIN/NAILS PO), Take by mouth., Disp: , Rfl:    Calcium -Magnesium -Zinc-Vit D3 (CALCIUM -MAGNESIUM -ZINC-D3 PO), Take by mouth., Disp: , Rfl:    dorzolamide  (TRUSOPT ) 2 % ophthalmic solution, 1 drop into affected eye Ophthalmic twice a day, Disp: , Rfl:    dorzolamide -timolol  (COSOPT ) 22.3-6.8 MG/ML ophthalmic solution, Place 1 drop into both eyes 2 (two) times daily. , Disp: , Rfl:    Ferrous Gluconate (IRON 27 PO), Take by mouth., Disp: , Rfl:    gabapentin  (NEURONTIN ) 300 MG capsule, Take 1 capsule (300 mg total) by mouth 3 (three) times daily., Disp: 270 capsule, Rfl: 3   glucosamine-chondroitin 500-400 MG tablet, Take 1 tablet by mouth 3 (three) times daily., Disp: , Rfl:    irbesartan  (AVAPRO ) 300 MG tablet, TAKE 1 TABLET BY MOUTH DAILY, Disp: 90 tablet, Rfl: 3   latanoprost  (XALATAN ) 0.005 % ophthalmic solution, Place 1 drop into the left eye at bedtime., Disp: , Rfl:    levothyroxine  (SYNTHROID ) 75 MCG tablet, TAKE 1 TABLET BY MOUTH 5 DAYS A  WEEK AND 1 AND 1/2 TABLETS 2  DAYS A WEEK, Disp: 104 tablet, Rfl: 3   Multiple Vitamin (MULTIVITAMIN ADULT) TABS, Take 1 tablet by mouth daily., Disp: , Rfl:    polyvinyl alcohol (LIQUIFILM TEARS) 1.4 % ophthalmic solution, Place 1 drop into both eyes daily as needed (for dry eyes)., Disp: , Rfl:    rosuvastatin  (CRESTOR ) 5 MG tablet, TAKE 1 TABLET BY MOUTH TWICE  WEEKLY, Disp: 26 tablet, Rfl: 3   VITAMIN D  PO, Take by mouth., Disp: , Rfl:   Allergies  Allergen Reactions   Denosumab Other (See Comments)    Causes Muscle Pain   Erythromycin Nausea And Vomiting and Nausea Only    Other reaction(s): Unknown   Ezetimibe Other (See Comments)    Other reaction(s): myalgias   Latex Other (See Comments)   Other Itching    Unknown Dye PET scan dye possibly= itching   Statins Other (See Comments)    Other  reaction(s): myalgias   ROS neg/noncontributory except as noted HPI/below      Objective:     BP 136/80   Pulse 68   Temp 98.4 F (36.9 C)   Ht 5' 3 (1.6 m)   Wt 158 lb 6.4 oz (71.8 kg)   SpO2 98%   BMI 28.06 kg/m  Wt Readings from Last 3 Encounters:  08/23/24 158 lb 6.4 oz (71.8 kg)  08/16/24 159 lb (72.1 kg)  06/23/24 159 lb 2 oz (72.2 kg)    Physical Exam   Gen: [facility] NAD HEENT: NCAT, conjunctiva not injected, sclera nonicteric EXT:  no edema MSK: no gross abnormalities.  NEURO: A&O x3.  CN II-XII intact.  PSYCH: normal mood. Good eye contact  Back:  can stand on heels/toes/1 leg.  + TTP lower spine. +R>L SI tenderness. No rash.  MS 5/5 BLE.  But some weakness on R hip(per pt, old).  SLR neg B.  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 There was no head injury or loss of consciousness. She reports soreness but no significant pain or visible bruising, and no numbness, tingling, or new bowel/bladder issues. She feels off balance and more tired than usual. There is no evidence of fracture, but x-rays are pending to rule out hairline fractures before chiropractic adjustments. Order x-rays to rule out fractures before chiropractic adjustments.  Bilateral hip replacement   She reports no new pain related to her hip replacement. Some soreness is attributed to physical therapy and not the fall.  Osteoporosis of hip   Osteoporosis at the hip is well-managed. Bone density test shows no significant change. She prefers non-pharmacological approaches due to side effects from previous treatments. Consider Refer to [facility] exercise program for  osteoporosis management. Continue calcium  and vitamin D  supplementation.  Osteopenia of wrist   Osteopenia at the wrist is well-managed. Bone density test shows no significant change. Continue calcium  and vitamin D  supplementation.    Return if symptoms worsen or fail to improve.  Jenkins CHRISTELLA Carrel, MD

## 2024-08-24 NOTE — Telephone Encounter (Signed)
-----   Message from Jenkins CHRISTELLA Carrel sent at 08/23/2024  8:42 PM EDT ----- Spine-no fx.  R hip-possible remote fracture.  Should see her orthopedic doctor(probably old).  ----- Message ----- From: Interface, Rad Results In Sent: 08/23/2024   4:18 PM EDT To: Jenkins Earnie Carrel, MD

## 2024-08-24 NOTE — Telephone Encounter (Signed)
 Spoke with patient about recent DG results and notes per Dr Wendolyn. Pt acknowledged understanding.

## 2024-08-25 ENCOUNTER — Ambulatory Visit: Admitting: Physical Therapy

## 2024-08-25 DIAGNOSIS — M5412 Radiculopathy, cervical region: Secondary | ICD-10-CM

## 2024-08-25 DIAGNOSIS — R2689 Other abnormalities of gait and mobility: Secondary | ICD-10-CM | POA: Diagnosis not present

## 2024-08-25 DIAGNOSIS — M5459 Other low back pain: Secondary | ICD-10-CM | POA: Diagnosis not present

## 2024-08-25 NOTE — Therapy (Unsigned)
 OUTPATIENT PHYSICAL THERAPY CERVICAL TREATMENT   Patient Name: [name] [name] MRN: [id number] DOB:[birth year], 78 y.o., female Today's Date: 08/25/2024  END OF SESSION:       Past Medical History:  Diagnosis Date   Abnormal Pap smear    Abnormal peripheral vision    RT EYE   Allergy    Anemia    Arthritis    back and neck   Breast cancer (HCC) 1997   right breast   Bruises easily    Cataracts, bilateral    Chronic back pain    Chronic neck pain    Complication of anesthesia    slow to wake up   Family history of adverse reaction to anesthesia    Dad takes a long time to wake up   Frequency of urination    GERD (gastroesophageal reflux disease)    Glaucoma    Goiter    High cholesterol    History of postoperative nausea and vomiting    Hypertension    Hypothyroidism    Neuromuscular disorder (HCC)    Numbness    hands / feet /arms / legs - followed by Dr. [place] - [place]   Osteoporosis    PONV (postoperative nausea and vomiting)    Post-menopausal    Prediabetes    Sexual dysfunction    Shortness of breath    with exertion and Wt gain   Urinary incontinence    Past Surgical History:  Procedure Laterality Date   BIOPSY THYROID      BREAST SURGERY Right    lymph node removed   CATARACT EXTRACTION, BILATERAL  2009,2010   COLONOSCOPY  02/15/2014   Dr.Stark   EYE SURGERY  08/2012   detached retina surg   LAPAROSCOPY  1978   BTL   lumpectomy  1997   Rt   neck fusion  2010   THYROIDECTOMY N/A 05/04/2013   Procedure:  TOTAL THYROIDECTOMY;  Surgeon: Krystal CHRISTELLA Spinner, MD;  Location: [facility];  Service: General;  Laterality: N/A;   TOTAL HIP ARTHROPLASTY Right 01/02/2021   Procedure: TOTAL HIP ARTHROPLASTY ANTERIOR APPROACH;  Surgeon: Ernie Cough, MD;  Location: [facility];  Service: Orthopedics;  Laterality: Right;  70 mins   TUBAL LIGATION     Patient Active Problem List   Diagnosis Date Noted   S/P cervical spinal fusion 08/16/2024   Neck pain  08/16/2024   Impaired fasting glucose 03/09/2024   Postoperative hypothyroidism 03/09/2024   Essential hypertension 03/09/2024   Osteoporosis 03/09/2024   Senile osteoporosis 09/02/2023   Prediabetes 06/26/2022   Aortic atherosclerosis 12/26/2021   Polyp of colon 12/26/2021   Familial hypercholesterolemia 10/28/2021   Osteoarthritis of right hip 01/02/2021   Status post right hip replacement 01/02/2021   Primary open angle glaucoma (POAG) of right eye, mild stage 09/13/2018   Diplopia 11/09/2015   Pseudophakia of both eyes 11/09/2015   Right sided weakness 10/28/2013   Cervical dystonia 10/28/2013   Bilateral dry eyes 10/04/2013   S/P total thyroidectomy 08/03/2013   Hypothyroidism 08/03/2013   Other forms of retinal detachment(361.89) 01/18/2013   Urinary incontinence, mixed 01/18/2013   Constipation 01/18/2013   HTN (hypertension) 12/28/2012   History of breast cancer 09/16/2012   Right retinal detachment 09/16/2012   Degeneration of lumbar intervertebral disc 05/30/2012   Multiple thyroid  nodules 04/06/2012   Glaucoma 03/19/2012   Former smoker 03/19/2012   Chronic pain following surgery or procedure 03/19/2012   Mammographic calcification 03/19/2012   Anemia  Cataracts, bilateral    Dyslipidemia (high LDL; low HDL) 09/30/2011   Thyroiditis 09/25/2010   Neck pain, musculoskeletal 07/04/2010   History of detached retina repair 07/04/2010   Cervical cord compression with myelopathy (HCC) 09/28/2009   Family hx of colon cancer 08/15/2009   Chronic back pain 08/15/2009   S/P breast lumpectomy 08/15/2009   Malignant neoplasm of breast (HCC) 04/03/2009   Hyperlipidemia 04/03/2009   Arthritis 04/03/2009   Goiter 04/03/2009    PCP: Wendolyn Jenkins Jansky, MD  REFERRING PROVIDER: Ulis Bottcher, PA-C  REFERRING DIAG: Z98.1 (ICD-10-CM) - S/P cervical spinal fusion M54.2 (ICD-10-CM) - Neck pain  THERAPY DIAG:  No diagnosis found.  Rationale for Evaluation and Treatment:  Rehabilitation  ONSET DATE: Flare up ~1-2 weeks ago  SUBJECTIVE:                                                                                                                                                                                                         SUBJECTIVE STATEMENT: Numbness still present in arm, arm feels weak.  States soreness, but feels back , core and hip things have been helpful.     Pt has appt with neurosurgeon on  9/22.   Overall, neck/shoulders bothering her, feels neck is a bit better than at previous visit. She has a hard time standing, cooking, meal prep due to pain. She also has pain in back and weakness in legs that she feels is limiting for standing activity. She feels arms are very weak, and R hip also weak from previous THA.     Pt states she has a rod in her neck in 2010. Reports it's been hurting a lot lately. Pt states she was in a lot of pain -- was doing more than usual at the time. Pt is concerned about her diagnosis with osteoporosis. Had the operation when she was in [place]  and didn't get a lot of follow up. Not sure what she is allowed to do. Was told not to reach up high or hyperextend but not sure if it's for the rest of her life or not. Does have chronic N/T in bilat hands but more on the right. Reports it may be from a disc issue. Does have pain across her back and notes weakness. Has been having more frequent headaches.   Hand dominance: Right  PERTINENT HISTORY:  Osteoporosis, ACDF 2010  PAIN:  Are you having pain? Yes: NPRS scale: 6 currently, at worst 10 Pain location: Back of neck and top of shoulders Pain description: Numbness, constant irritating nagging pain  Aggravating factors: doing too much, reaching Relieving factors: new supplements, rest  PRECAUTIONS: None  RED FLAGS: None     WEIGHT BEARING RESTRICTIONS: No  FALLS:  Has patient fallen in last 6 months? No  LIVING ENVIRONMENT: Lives with: lives alone Lives in:  House/apartment Stairs: No Has following equipment at home: None  OCCUPATION: Retired  PLOF: Independent  PATIENT GOALS: Improve neck pain and reaching  NEXT MD VISIT: 08/11/24 neurosurgeon  OBJECTIVE:  Note: Objective measures were completed at Evaluation unless otherwise noted.  DIAGNOSTIC FINDINGS:  06/16/24 Cervical x-ray IMPRESSION: 1. Anterior fusion plate at R4-R3 without evidence for hardware loosening or acute fracture. 2. Mild degenerative endplate changes at C4-C5 and C6-C7.  MRI 06/29/24 results pending  PATIENT SURVEYS:  QuickDASH Score: Eval: 52.3 / 100 = 52.3 %  COGNITION: Overall cognitive status: Within functional limits for tasks assessed  SENSATION: Chronic N/T in bilat hands  POSTURE: rounded shoulders, forward head, and decreased thoracic kyphosis  PALPATION: TTP bilat suboccipitals, throughout cervical and upper thoracic paraspinals, R>L UT, SCMs Hypomobile upper cervical in all directions   CERVICAL ROM:   Active ROM A/PROM (deg) eval 07/28/24   Flexion 26   Extension 15   Right lateral flexion 20   Left lateral flexion 15   Right rotation 22 55  Left rotation 37 65   (Blank rows = not tested)  UPPER EXTREMITY MMT (within available range)  MMT: Right eval Left eval  Shoulder flexion 3 pulls in neck 3+ pulls in neck  Shoulder extension    Shoulder abduction 3 3+  Shoulder adduction    Shoulder extension 4 4  Shoulder internal rotation 3+ 4  Shoulder external rotation 3+ 4  Elbow flexion    Elbow extension    Wrist flexion    Wrist extension    Wrist ulnar deviation    Wrist radial deviation    Wrist pronation    Wrist supination     (Blank rows = not tested)   UPPER EXTREMITY ROM:  AROM Right eval Left eval Right 08/23/24 Left 08/23/24  Shoulder flexion 100 105    Shoulder extension seated 55 70    Shoulder abduction 110 110    Shoulder adduction      Shoulder extension prone      Shoulder internal rotation T12 T10     Shoulder external rotation 70 70    Middle trapezius      Lower trapezius      Elbow flexion      Elbow extension      Wrist flexion      Wrist extension      Wrist ulnar deviation      Wrist radial deviation      Wrist pronation      Wrist supination      Grip strength   28 25   (Blank rows = not tested)  CERVICAL SPECIAL TESTS:  Distraction test: felt good but didn't diminish her N/T  FUNCTIONAL TESTS:    TREATMENT DATE:  08/25/2024 Therapeutic Exercise: Aerobic: Supine:  Supine march with TA (for hip and core strength) 2 x 10;   Clams GTB x 15  SLR(knee bent) 2 x 5;   Seated: bicep curls 3 lb  2 x 8 (tolerated better than standing )  S/L:  clams x10 then x 8  on R  Standing:   Rows GTB x 15;  Shoulder ER YTB 2 x 10; Shoulder elevation/arom, 2 x 6  Stretches:  LTR x10;  Neuromuscular Re-education: Manual Therapy:   Therapeutic Activity: Self Care:   Therapeutic Exercise: Aerobic: Supine:  Supine march with TA (for hip and core strength)2 x 10;    Clams RTB x 20;   shoulder flexion/arom  x 10 ;  SLR(assisted) x 6 on R (difficult and painful).  Seated: arom:  Standing:  Stretches:  LTR x10;  Neuromuscular Re-education: Manual Therapy:  STM/TPR  to Bil UT and paraspinals , manual stretching for UT s . Light distraction;  Therapeutic Activity: Self Care:   Therapeutic Exercise: Aerobic: Supine: shoulder flexion/aarom/cane 2 x 8  Seated: arom: neck rotation x 10 bil,  rows RTB 2 x 8 ;  pulley/flexion x 10;  Standing: Scap squeeze 2 x 10;   Bicep curls 3 lb 2 x 8 (had to sit down) ; Wall slides x 10 bil;  Stretches:  Neuromuscular Re-education: Manual Therapy: STM/TPR   to R UT and paraspinals  Therapeutic Activity: Self Care:   Therapeutic Exercise: Aerobic: Supine: shoulder flexion/aarom/cane x 8 then x 5;   Seated: arom: neck  rotation x 5 bil, flexion x 10 Standing: Scap squeeze 2 x 10;  shoulder rolls x 10;  Stretches:  Neuromuscular Re-education: Manual Therapy: STM to bil Uts and cervical paraspinals.  Therapeutic Activity: Self Care:   PATIENT EDUCATION:  Education details: updated and reviewed HEP Person educated: Patient Education method: Explanation, Demonstration, and Handouts Education comprehension: verbalized understanding, returned demonstration, and needs further education  HOME EXERCISE PROGRAM: Access Code: [email or web address]   ASSESSMENT:  CLINICAL IMPRESSION: Pt with good tolerance for activity today. She is challenged with strength for UE and R LE, and will benefit from continued education on strengthening. She has improved shoulder elevation arom from Eval. Pt to benefit from continued care.    Eval: Patient is a 78 y.o. F who was seen today for physical therapy evaluation and treatment for neck and shoulder pain. PMH significant for osteoporosis and history of ACDF C5-6 with chronic history of bilat UE N/T. Assessment is significant for hypomobile C-spine with multiple trigger points throughout her cervical muscles, bilat shoulder and postural weakness (R weaker than L), and postural abnormalities causing issues with reaching and pain. Pt will benefit from PT to improve on these deficits.   OBJECTIVE IMPAIRMENTS: decreased activity tolerance, decreased coordination, decreased endurance, decreased mobility, decreased ROM, decreased strength, hypomobility, increased fascial restrictions, increased muscle spasms, impaired flexibility, impaired sensation, impaired UE functional use, postural dysfunction, and pain.   ACTIVITY LIMITATIONS: carrying, lifting, bathing, toileting, dressing, self feeding, reach over head, and hygiene/grooming  PARTICIPATION LIMITATIONS: meal prep, cleaning, laundry, driving, and community activity  PERSONAL FACTORS: Age, Fitness, Past/current experiences, and Time since  onset of injury/illness/exacerbation are also affecting patient's functional outcome.   REHAB POTENTIAL: Good  CLINICAL DECISION MAKING: Evolving/moderate complexity  EVALUATION COMPLEXITY: Moderate   GOALS: Goals reviewed with patient? Yes  SHORT TERM GOALS: Target date: 07/30/2024   Pt will be ind with initial HEP Baseline:  Goal status: INITIAL  2.  Pt  will improve shoulder AROM by at least 10 deg for flexion and abd for overhead reaching Baseline:  Goal status: INITIAL  3.  Pt will report decrease in pain by >/=25% Baseline:  Goal status: INITIAL   LONG TERM GOALS: Target date: 08/27/2024   Pt will be ind with management and progression of HEP Baseline:  Goal status: INITIAL  2.  Pt will demo improved cervical rotation to at least 60 deg for driving tasks Baseline: 22 deg on L, 37 deg on R Goal status: INITIAL  3.  Pt will improve shoulder flexion and abd AROM to >/=140 deg for reaching into kitchen cabinets Baseline: see ROM chart above Goal status: INITIAL  4.  Pt will report improved pain by >/=50% Baseline:  Goal status: INITIAL  5.  Pt will have improved QuickDASH score to </=42.3 to demo MCID Baseline: 52.3 Goal status: INITIAL    PLAN:  PT FREQUENCY: 2x/week  PT DURATION: 8 weeks  PLANNED INTERVENTIONS: 02835- PT Re-evaluation, 97750- Physical Performance Testing, 97110-Therapeutic exercises, 97530- Therapeutic activity, [id number]- Neuromuscular re-education, 97535- Self Care, [id number]- Manual therapy, [id number]- Electrical stimulation (unattended), [phone]- Traction (mechanical), [id number]- Ionotophoresis 4mg /ml Dexamethasone , 79439 (1-2 muscles), [id number] (3+ muscles)- Dry Needling, Patient/Family education, Taping, Joint mobilization, Spinal mobilization, Cryotherapy, and Moist heat  PLAN FOR NEXT SESSION:  shoulder flexion aarom- arom, posture /head posture for cooking Wall push ups,   manual for neck R hip strength/flexion. abd     Tinnie Don, PT,  DPT 08/25/2024, 9:43 AM

## 2024-08-27 ENCOUNTER — Encounter: Payer: Self-pay | Admitting: Physical Therapy

## 2024-08-30 ENCOUNTER — Encounter: Admitting: Physical Therapy

## 2024-09-01 ENCOUNTER — Encounter: Payer: Self-pay | Admitting: Physical Therapy

## 2024-09-01 ENCOUNTER — Ambulatory Visit (INDEPENDENT_AMBULATORY_CARE_PROVIDER_SITE_OTHER): Admitting: Physical Therapy

## 2024-09-01 DIAGNOSIS — M5459 Other low back pain: Secondary | ICD-10-CM | POA: Diagnosis not present

## 2024-09-01 DIAGNOSIS — M5412 Radiculopathy, cervical region: Secondary | ICD-10-CM | POA: Diagnosis not present

## 2024-09-01 NOTE — Therapy (Signed)
 OUTPATIENT PHYSICAL THERAPY CERVICAL TREATMENT   Patient Name: [name] [name] MRN: [id number] DOB:1946-03-09, 78 y.o., female Today's Date: 09/01/2024   END OF SESSION:  PT End of Session - 09/01/24 1124     Visit Number 8    Number of Visits 16    Date for Recertification  08/26/24    Authorization Type [employer] Medicare  10 v through 10/30    PT Start Time 0934    PT Stop Time 1015    PT Time Calculation (min) 41 min    Activity Tolerance Patient tolerated treatment well    Behavior During Therapy [facility] for tasks assessed/performed               Past Medical History:  Diagnosis Date   Abnormal Pap smear    Abnormal peripheral vision    RT EYE   Allergy    Anemia    Arthritis    back and neck   Breast cancer (HCC) 1997   right breast   Bruises easily    Cataracts, bilateral    Chronic back pain    Chronic neck pain    Complication of anesthesia    slow to wake up   Family history of adverse reaction to anesthesia    Dad takes a long time to wake up   Frequency of urination    GERD (gastroesophageal reflux disease)    Glaucoma    Goiter    High cholesterol    History of postoperative nausea and vomiting    Hypertension    Hypothyroidism    Neuromuscular disorder (HCC)    Numbness    hands / feet /arms / legs - followed by Dr. [place]   Osteoporosis    PONV (postoperative nausea and vomiting)    Post-menopausal    Prediabetes    Sexual dysfunction    Shortness of breath    with exertion and Wt gain   Urinary incontinence    Past Surgical History:  Procedure Laterality Date   BIOPSY THYROID      BREAST SURGERY Right    lymph node removed   CATARACT EXTRACTION, BILATERAL  2009,2010   COLONOSCOPY  02/15/2014   Dr.Stark   EYE SURGERY  08/2012   detached retina surg   LAPAROSCOPY  1978   BTL   lumpectomy  1997   Rt   neck fusion  2010   THYROIDECTOMY N/A 05/04/2013   Procedure:  TOTAL THYROIDECTOMY;  Surgeon: Krystal CHRISTELLA Spinner, MD;   Location: [facility];  Service: General;  Laterality: N/A;   TOTAL HIP ARTHROPLASTY Right 01/02/2021   Procedure: TOTAL HIP ARTHROPLASTY ANTERIOR APPROACH;  Surgeon: Ernie Cough, MD;  Location: [facility];  Service: Orthopedics;  Laterality: Right;  70 mins   TUBAL LIGATION     Patient Active Problem List   Diagnosis Date Noted   S/P cervical spinal fusion 08/16/2024   Neck pain 08/16/2024   Impaired fasting glucose 03/09/2024   Postoperative hypothyroidism 03/09/2024   Essential hypertension 03/09/2024   Osteoporosis 03/09/2024   Senile osteoporosis 09/02/2023   Prediabetes 06/26/2022   Aortic atherosclerosis 12/26/2021   Polyp of colon 12/26/2021   Familial hypercholesterolemia 10/28/2021   Osteoarthritis of right hip 01/02/2021   Status post right hip replacement 01/02/2021   Primary open angle glaucoma (POAG) of right eye, mild stage 09/13/2018   Diplopia 11/09/2015   Pseudophakia of both eyes 11/09/2015   Right sided weakness 10/28/2013   Cervical dystonia 10/28/2013  Bilateral dry eyes 10/04/2013   S/P total thyroidectomy 08/03/2013   Hypothyroidism 08/03/2013   Other forms of retinal detachment(361.89) 01/18/2013   Urinary incontinence, mixed 01/18/2013   Constipation 01/18/2013   HTN (hypertension) 12/28/2012   History of breast cancer 09/16/2012   Right retinal detachment 09/16/2012   Degeneration of lumbar intervertebral disc 05/30/2012   Multiple thyroid  nodules 04/06/2012   Glaucoma 03/19/2012   Former smoker 03/19/2012   Chronic pain following surgery or procedure 03/19/2012   Mammographic calcification 03/19/2012   Anemia    Cataracts, bilateral    Dyslipidemia (high LDL; low HDL) 09/30/2011   Thyroiditis 09/25/2010   Neck pain, musculoskeletal 07/04/2010   History of detached retina repair 07/04/2010   Cervical cord compression with myelopathy (HCC) 09/28/2009   Family hx of colon cancer 08/15/2009   Chronic back pain 08/15/2009   S/P breast lumpectomy  08/15/2009   Malignant neoplasm of breast (HCC) 04/03/2009   Hyperlipidemia 04/03/2009   Arthritis 04/03/2009   Goiter 04/03/2009    PCP: Wendolyn Jenkins Jansky, MD  REFERRING PROVIDER: Ulis Bottcher, PA-C  REFERRING DIAG: Z98.1 (ICD-10-CM) - S/P cervical spinal fusion M54.2 (ICD-10-CM) - Neck pain  THERAPY DIAG:  Radiculopathy, cervical region  Other low back pain  Rationale for Evaluation and Treatment: Rehabilitation  ONSET DATE: Flare up ~1-2 weeks ago  SUBJECTIVE:                                                                                                                                                                                                         SUBJECTIVE STATEMENT: Pt feels neck is doing better.    Pt has appt with neurosurgeon on  9/22.   Overall, neck/shoulders bothering her, feels neck is a bit better than at previous visit. She has a hard time standing, cooking, meal prep due to pain. She also has pain in back and weakness in legs that she feels is limiting for standing activity. She feels arms are very weak, and R hip also weak from previous THA.     Pt states she has a rod in her neck in 2010. Reports it's been hurting a lot lately. Pt states she was in a lot of pain -- was doing more than usual at the time. Pt is concerned about her diagnosis with osteoporosis. Had the operation when she was in [place]  and didn't get a lot of follow up. Not sure what she is allowed to do. Was told not to reach up high or hyperextend but not sure if it's for the rest of her  life or not. Does have chronic N/T in bilat hands but more on the right. Reports it may be from a disc issue. Does have pain across her back and notes weakness. Has been having more frequent headaches.   Hand dominance: Right  PERTINENT HISTORY:  Osteoporosis, ACDF 2010  PAIN:  Are you having pain? Yes: NPRS scale: 6 currently, at worst 10 Pain location: Back of neck and top of shoulders Pain  description: Numbness, constant irritating nagging pain Aggravating factors: doing too much, reaching Relieving factors: new supplements, rest  PRECAUTIONS: None  RED FLAGS: None     WEIGHT BEARING RESTRICTIONS: No  FALLS:  Has patient fallen in last 6 months? No  LIVING ENVIRONMENT: Lives with: lives alone Lives in: House/apartment Stairs: No Has following equipment at home: None  OCCUPATION: Retired  PLOF: Independent  PATIENT GOALS: Improve neck pain and reaching  NEXT MD VISIT: 08/11/24 neurosurgeon  OBJECTIVE:  Note: Objective measures were completed at Evaluation unless otherwise noted.  DIAGNOSTIC FINDINGS:  06/16/24 Cervical x-ray IMPRESSION: 1. Anterior fusion plate at R4-R3 without evidence for hardware loosening or acute fracture. 2. Mild degenerative endplate changes at C4-C5 and C6-C7.  MRI 06/29/24 results pending  PATIENT SURVEYS:  QuickDASH Score: Eval: 52.3 / 100 = 52.3 %  COGNITION: Overall cognitive status: Within functional limits for tasks assessed  SENSATION: Chronic N/T in bilat hands  POSTURE: rounded shoulders, forward head, and decreased thoracic kyphosis  PALPATION: TTP bilat suboccipitals, throughout cervical and upper thoracic paraspinals, R>L UT, SCMs Hypomobile upper cervical in all directions   CERVICAL ROM:   Active ROM A/PROM (deg) eval 07/28/24  10/8 neck  Flexion 26  Wfl   Extension 15  25 no pain   Right lateral flexion 20    Left lateral flexion 15    Right rotation 22 55 60  Left rotation 37 65 68   (Blank rows = not tested)  UPPER EXTREMITY MMT (within available range)  MMT: Right eval Left eval  Shoulder flexion 3 pulls in neck 3+ pulls in neck  Shoulder extension    Shoulder abduction 3 3+  Shoulder adduction    Shoulder extension 4 4  Shoulder internal rotation 3+ 4  Shoulder external rotation 3+ 4  Elbow flexion    Elbow extension    Wrist flexion    Wrist extension    Wrist ulnar deviation     Wrist radial deviation    Wrist pronation    Wrist supination     (Blank rows = not tested)   UPPER EXTREMITY ROM:  AROM Right eval Left eval Right 09/01/24 Left 09/01/24  Shoulder flexion 100 105 128 135  Shoulder extension seated 55 70    Shoulder abduction 110 110    Shoulder adduction      Shoulder extension prone      Shoulder internal rotation T12 T10    Shoulder external rotation 70 70    Middle trapezius      Lower trapezius      Elbow flexion      Elbow extension      Wrist flexion      Wrist extension      Wrist ulnar deviation      Wrist radial deviation      Wrist pronation      Wrist supination      Grip strength       (Blank rows = not tested)  CERVICAL SPECIAL TESTS:  Distraction test: felt good but didn't  diminish her N/T  FUNCTIONAL TESTS:    TREATMENT DATE:          09/01/24           Therapeutic Exercise: Aerobic: Supine:  Supine march with TA (for hip and core strength)  x 10;    SLR(knee bent) 2 x 5;   bridging x 10- cueing for form. - very  challenging  Seated: bicep curls 3 lb  2 x 8 (sitting )  S/L:  Standing:   Rows GTB x 15;  Shoulder elevation/arom x 10 Wall push ups 2 x 8  Stretches:   Neuromuscular Re-education: Manual Therapy:   Therapeutic Activity: Self Care:                                                                                                      08/25/2024 Therapeutic Exercise: Aerobic: Supine:  Supine march with TA (for hip and core strength) 2 x 10;   Clams GTB x 15  SLR(knee bent) 2 x 5;   Seated: bicep curls 3 lb  2 x 8 (tolerated better in sitting )  S/L:  clams x10 then x 8  on R  Standing:   Rows GTB x 15;  Shoulder ER YTB 2 x 10;  Shoulder elevation/arom x 10 Wall push ups 2 x 8  Marching x 20; review of osteoporosis exercises.   Stretches:  LTR x10;  Neuromuscular Re-education: Manual Therapy:   Therapeutic Activity: Self Care:   Therapeutic Exercise: Aerobic: Supine:  Supine march with  TA (for hip and core strength)2 x 10;    Clams RTB x 20;   shoulder flexion/arom  x 10 ;  SLR(assisted) x 6 on R (difficult and painful).  Seated: arom:  Standing:  Stretches:  LTR x10;  Neuromuscular Re-education: Manual Therapy:  STM/TPR  to Bil UT and paraspinals , manual stretching for UT s . Light distraction;  Therapeutic Activity: Self Care:   Therapeutic Exercise: Aerobic: Supine: shoulder flexion/aarom/cane 2 x 8  Seated: arom: neck rotation x 10 bil,  rows RTB 2 x 8 ;  pulley/flexion x 10;  Standing: Scap squeeze 2 x 10;   Bicep curls 3 lb 2 x 8 (had to sit down) ; Wall slides x 10 bil;  Stretches:  Neuromuscular Re-education: Manual Therapy: STM/TPR   to R UT and paraspinals  Therapeutic Activity: Self Care:   Therapeutic Exercise: Aerobic: Supine: shoulder flexion/aarom/cane x 8 then x 5;   Seated: arom: neck rotation x 5 bil, flexion x 10 Standing: Scap squeeze 2 x 10;  shoulder rolls x 10;  Stretches:  Neuromuscular Re-education: Manual Therapy: STM to bil Uts and cervical paraspinals.  Therapeutic Activity: Self Care:   PATIENT EDUCATION:  Education details: updated and reviewed HEP Person educated: Patient Education method: Explanation, Demonstration, and Handouts Education comprehension: verbalized understanding, returned demonstration, and needs further education  HOME EXERCISE PROGRAM: Access Code: [email or web address]   ASSESSMENT:  CLINICAL IMPRESSION: Pt with good tolerance for activity today. Continues to be challenged with UE strength and ROM, but is improving with shoulder arom  and neck pain. Making good improvements. Will benefit from continued care .   Eval: Patient is a 78 y.o. F who was seen today for physical therapy evaluation and treatment for neck and shoulder pain. PMH significant for osteoporosis and history of ACDF C5-6 with chronic history of bilat UE N/T. Assessment is significant for hypomobile C-spine with multiple trigger points  throughout her cervical muscles, bilat shoulder and postural weakness (R weaker than L), and postural abnormalities causing issues with reaching and pain. Pt will benefit from PT to improve on these deficits.   OBJECTIVE IMPAIRMENTS: decreased activity tolerance, decreased coordination, decreased endurance, decreased mobility, decreased ROM, decreased strength, hypomobility, increased fascial restrictions, increased muscle spasms, impaired flexibility, impaired sensation, impaired UE functional use, postural dysfunction, and pain.   ACTIVITY LIMITATIONS: carrying, lifting, bathing, toileting, dressing, self feeding, reach over head, and hygiene/grooming  PARTICIPATION LIMITATIONS: meal prep, cleaning, laundry, driving, and community activity  PERSONAL FACTORS: Age, Fitness, Past/current experiences, and Time since onset of injury/illness/exacerbation are also affecting patient's functional outcome.   REHAB POTENTIAL: Good  CLINICAL DECISION MAKING: Evolving/moderate complexity  EVALUATION COMPLEXITY: Moderate   GOALS: Goals reviewed with patient? Yes  SHORT TERM GOALS: Target date: 07/30/2024   Pt will be ind with initial HEP Baseline:  Goal status: INITIAL  2.  Pt will improve shoulder AROM by at least 10 deg for flexion and abd for overhead reaching Baseline:  Goal status: INITIAL  3.  Pt will report decrease in pain by >/=25% Baseline:  Goal status: INITIAL   LONG TERM GOALS: Target date: 08/27/2024   Pt will be ind with management and progression of HEP Baseline:  Goal status: INITIAL  2.  Pt will demo improved cervical rotation to at least 60 deg for driving tasks Baseline: 22 deg on L, 37 deg on R Goal status: INITIAL  3.  Pt will improve shoulder flexion and abd AROM to >/=140 deg for reaching into kitchen cabinets Baseline: see ROM chart above Goal status: INITIAL  4.  Pt will report improved pain by >/=50% Baseline:  Goal status: INITIAL  5.  Pt will have  improved QuickDASH score to </=42.3 to demo MCID Baseline: 52.3 Goal status: INITIAL    PLAN:  PT FREQUENCY: 2x/week  PT DURATION: 8 weeks  PLANNED INTERVENTIONS: 02835- PT Re-evaluation, 97750- Physical Performance Testing, 97110-Therapeutic exercises, 97530- Therapeutic activity, [id number]- Neuromuscular re-education, 97535- Self Care, 02859- Manual therapy, [id number]- Electrical stimulation (unattended), [phone]- Traction (mechanical), [id number]- Ionotophoresis 4mg /ml Dexamethasone , 79439 (1-2 muscles), 20561 (3+ muscles)- Dry Needling, Patient/Family education, Taping, Joint mobilization, Spinal mobilization, Cryotherapy, and Moist heat  PLAN FOR NEXT SESSION:  shoulder flexion aarom- arom, posture /head posture for cooking Wall push ups,   manual for neck R hip strength/flexion. abd     Tinnie Don, PT, DPT 09/01/2024, 11:25 AM

## 2024-09-06 ENCOUNTER — Encounter: Payer: Self-pay | Admitting: Physical Therapy

## 2024-09-06 ENCOUNTER — Ambulatory Visit (INDEPENDENT_AMBULATORY_CARE_PROVIDER_SITE_OTHER): Admitting: Physical Therapy

## 2024-09-06 DIAGNOSIS — M5459 Other low back pain: Secondary | ICD-10-CM

## 2024-09-06 DIAGNOSIS — R2689 Other abnormalities of gait and mobility: Secondary | ICD-10-CM

## 2024-09-06 DIAGNOSIS — M5412 Radiculopathy, cervical region: Secondary | ICD-10-CM | POA: Diagnosis not present

## 2024-09-06 NOTE — Therapy (Unsigned)
 OUTPATIENT PHYSICAL THERAPY CERVICAL TREATMENT/Re-Eval   Patient Name: [name] [name] MRN: [id number] DOB:12/28/1945, 78 y.o., female Today's Date: 09/06/2024   END OF SESSION:  PT End of Session - 09/07/24 2111     Visit Number 9    Number of Visits 16    Date for Recertification  11/01/24    Authorization Type [employer] Medicare  10 v through 10/30    PT Start Time 0934    PT Stop Time 1015    PT Time Calculation (min) 41 min    Activity Tolerance Patient tolerated treatment well    Behavior During Therapy [facility] for tasks assessed/performed                Past Medical History:  Diagnosis Date   Abnormal Pap smear    Abnormal peripheral vision    RT EYE   Allergy    Anemia    Arthritis    back and neck   Breast cancer (HCC) 1997   right breast   Bruises easily    Cataracts, bilateral    Chronic back pain    Chronic neck pain    Complication of anesthesia    slow to wake up   Family history of adverse reaction to anesthesia    Dad takes a long time to wake up   Frequency of urination    GERD (gastroesophageal reflux disease)    Glaucoma    Goiter    High cholesterol    History of postoperative nausea and vomiting    Hypertension    Hypothyroidism    Neuromuscular disorder (HCC)    Numbness    hands / feet /arms / legs - followed by Dr. [place]   Osteoporosis    PONV (postoperative nausea and vomiting)    Post-menopausal    Prediabetes    Sexual dysfunction    Shortness of breath    with exertion and Wt gain   Urinary incontinence    Past Surgical History:  Procedure Laterality Date   BIOPSY THYROID      BREAST SURGERY Right    lymph node removed   CATARACT EXTRACTION, BILATERAL  2009,2010   COLONOSCOPY  02/15/2014   Dr.Stark   EYE SURGERY  08/2012   detached retina surg   LAPAROSCOPY  1978   BTL   lumpectomy  1997   Rt   neck fusion  2010   THYROIDECTOMY N/A 05/04/2013   Procedure:  TOTAL THYROIDECTOMY;  Surgeon: Krystal CHRISTELLA Spinner, MD;  Location: [facility];  Service: General;  Laterality: N/A;   TOTAL HIP ARTHROPLASTY Right 01/02/2021   Procedure: TOTAL HIP ARTHROPLASTY ANTERIOR APPROACH;  Surgeon: Ernie Cough, MD;  Location: [facility];  Service: Orthopedics;  Laterality: Right;  70 mins   TUBAL LIGATION     Patient Active Problem List   Diagnosis Date Noted   S/P cervical spinal fusion 08/16/2024   Neck pain 08/16/2024   Impaired fasting glucose 03/09/2024   Postoperative hypothyroidism 03/09/2024   Essential hypertension 03/09/2024   Osteoporosis 03/09/2024   Senile osteoporosis 09/02/2023   Prediabetes 06/26/2022   Aortic atherosclerosis 12/26/2021   Polyp of colon 12/26/2021   Familial hypercholesterolemia 10/28/2021   Osteoarthritis of right hip 01/02/2021   Status post right hip replacement 01/02/2021   Primary open angle glaucoma (POAG) of right eye, mild stage 09/13/2018   Diplopia 11/09/2015   Pseudophakia of both eyes 11/09/2015   Right sided weakness 10/28/2013   Cervical dystonia 10/28/2013  Bilateral dry eyes 10/04/2013   S/P total thyroidectomy 08/03/2013   Hypothyroidism 08/03/2013   Other forms of retinal detachment(361.89) 01/18/2013   Urinary incontinence, mixed 01/18/2013   Constipation 01/18/2013   HTN (hypertension) 12/28/2012   History of breast cancer 09/16/2012   Right retinal detachment 09/16/2012   Degeneration of lumbar intervertebral disc 05/30/2012   Multiple thyroid  nodules 04/06/2012   Glaucoma 03/19/2012   Former smoker 03/19/2012   Chronic pain following surgery or procedure 03/19/2012   Mammographic calcification 03/19/2012   Anemia    Cataracts, bilateral    Dyslipidemia (high LDL; low HDL) 09/30/2011   Thyroiditis 09/25/2010   Neck pain, musculoskeletal 07/04/2010   History of detached retina repair 07/04/2010   Cervical cord compression with myelopathy (HCC) 09/28/2009   Family hx of colon cancer 08/15/2009   Chronic back pain 08/15/2009   S/P breast  lumpectomy 08/15/2009   Malignant neoplasm of breast (HCC) 04/03/2009   Hyperlipidemia 04/03/2009   Arthritis 04/03/2009   Goiter 04/03/2009    PCP: Wendolyn Jenkins Jansky, MD  REFERRING PROVIDER: Ulis Bottcher, PA-C  REFERRING DIAG: Z98.1 (ICD-10-CM) - S/P cervical spinal fusion M54.2 (ICD-10-CM) - Neck pain  THERAPY DIAG:  Radiculopathy, cervical region  Other low back pain  Other abnormalities of gait and mobility  Rationale for Evaluation and Treatment: Rehabilitation  ONSET DATE: Flare up ~1-2 weeks ago  SUBJECTIVE:                                                                                                                                                                                                         SUBJECTIVE STATEMENT: Pt feels neck is doing better.    Pt has appt with neurosurgeon on  9/22.   Overall, neck/shoulders bothering her, feels neck is a bit better than at previous visit. She has a hard time standing, cooking, meal prep due to pain. She also has pain in back and weakness in legs that she feels is limiting for standing activity. She feels arms are very weak, and R hip also weak from previous THA.     Pt states she has a rod in her neck in 2010. Reports it's been hurting a lot lately. Pt states she was in a lot of pain -- was doing more than usual at the time. Pt is concerned about her diagnosis with osteoporosis. Had the operation when she was in [place]  and didn't get a lot of follow up. Not sure what she is allowed to do. Was told not to reach up high or hyperextend but not sure  if it's for the rest of her life or not. Does have chronic N/T in bilat hands but more on the right. Reports it may be from a disc issue. Does have pain across her back and notes weakness. Has been having more frequent headaches.   Hand dominance: Right  PERTINENT HISTORY:  Osteoporosis, ACDF 2010  PAIN:  Are you having pain? Yes: NPRS scale: 3 currently, at worst 6 Pain  location: Back of neck and top of shoulders Pain description: Numbness, constant irritating nagging pain Aggravating factors: doing too much, reaching Relieving factors: new supplements, rest  PRECAUTIONS: None  RED FLAGS: None     WEIGHT BEARING RESTRICTIONS: No  FALLS:  Has patient fallen in last 6 months? No  LIVING ENVIRONMENT: Lives with: lives alone Lives in: House/apartment Stairs: No Has following equipment at home: None  OCCUPATION: Retired  PLOF: Independent  PATIENT GOALS: Improve neck pain and reaching  NEXT MD VISIT: 08/11/24 neurosurgeon  OBJECTIVE:  Updated 10/13  DIAGNOSTIC FINDINGS:  06/16/24 Cervical x-ray IMPRESSION: 1. Anterior fusion plate at R4-R3 without evidence for hardware loosening or acute fracture. 2. Mild degenerative endplate changes at C4-C5 and C6-C7.  MRI 06/29/24 results pending  PATIENT SURVEYS:  QuickDASH Score: Eval: 52.3 / 100 = 52.3 %   COGNITION: Overall cognitive status: Within functional limits for tasks assessed  SENSATION: Chronic N/T in bilat hands  POSTURE: rounded shoulders, forward head, and decreased thoracic kyphosis  PALPATION:    CERVICAL ROM:   Active ROM A/PROM (deg) eval 07/28/24  10/13 neck  Flexion 26  Wfl   Extension 15  25 no pain   Right lateral flexion 20    Left lateral flexion 15    Right rotation 22 55 60  Left rotation 37 65 68   (Blank rows = not tested)  UPPER EXTREMITY MMT (within available range)  MMT: Right eval Left eval Right  10/13 Left 10/13  Shoulder flexion 3 pulls in neck 3+ pulls in neck 4 4  Shoulder extension      Shoulder abduction 3 3+ 4- 4-  Shoulder adduction      Shoulder extension 4 4    Shoulder internal rotation 3+ 4    Shoulder external rotation 3+ 4    Elbow flexion      Elbow extension      Wrist flexion      Wrist extension      Wrist ulnar deviation      Wrist radial deviation      Wrist pronation      Wrist supination       (Blank  rows = not tested)   UPPER EXTREMITY ROM:  AROM Right eval Left eval Right 09/01/24 Left 09/01/24  Shoulder flexion 100 105 128 135  Shoulder extension seated 55 70    Shoulder abduction 110 110    Shoulder adduction      Shoulder extension prone      Shoulder internal rotation T12 T10    Shoulder external rotation 70 70    Middle trapezius      Lower trapezius      Elbow flexion      Elbow extension      Wrist flexion      Wrist extension      Wrist ulnar deviation      Wrist radial deviation      Wrist pronation      Wrist supination      Grip strength       (  Blank rows = not tested)  CERVICAL SPECIAL TESTS:    FUNCTIONAL TESTS:    TREATMENT DATE:          09/06/24 Therapeutic Exercise: Aerobic: Supine:    SLR   x 5 on R ;   bridging x 10- cueing for form, shoulder flexion aarom x 10  Seated:  bicep curls 3 lb  2 x 10  standing, pulleys/flexion x 20  S/L:  clams x 15 on R;  Standing:   Scap squeeze x 12;  Rows BlueTB x 15;  Shoulder elevation/arom x 10 Wall push ups x 12  Stretches:   Neuromuscular Re-education: Manual Therapy:   Therapeutic Activity: Self Care:   09/01/24           Therapeutic Exercise: Aerobic: Supine:  Supine march with TA (for hip and core strength)  x 10;    SLR(knee bent) 2 x 5;   bridging x 10- cueing for form. - very  challenging  Seated: bicep curls 3 lb  2 x 8 (sitting )  S/L:  Standing:   Rows GTB x 15;  Shoulder elevation/arom x 10 Wall push ups 2 x 8  Stretches:   Neuromuscular Re-education: Manual Therapy:   Therapeutic Activity: Self Care:                                                                                                      08/25/2024 Therapeutic Exercise: Aerobic: Supine:  Supine march with TA (for hip and core strength) 2 x 10;   Clams GTB x 15  SLR(knee bent) 2 x 5;   Seated: bicep curls 3 lb  2 x 8 (tolerated better in sitting )  S/L:  clams x10 then x 8  on R  Standing:   Rows GTB x 15;   Shoulder ER YTB 2 x 10;  Shoulder elevation/arom x 10 Wall push ups 2 x 8  Marching x 20; review of osteoporosis exercises.   Stretches:  LTR x10;  Neuromuscular Re-education: Manual Therapy:   Therapeutic Activity: Self Care:   Therapeutic Exercise: Aerobic: Supine:  Supine march with TA (for hip and core strength)2 x 10;    Clams RTB x 20;   shoulder flexion/arom  x 10 ;  SLR(assisted) x 6 on R (difficult and painful).  Seated: arom:  Standing:  Stretches:  LTR x10;  Neuromuscular Re-education: Manual Therapy:  STM/TPR  to Bil UT and paraspinals , manual stretching for UT s . Light distraction;  Therapeutic Activity: Self Care:    PATIENT EDUCATION:  Education details: updated and reviewed HEP Person educated: Patient Education method: Explanation, Demonstration, and Handouts Education comprehension: verbalized understanding, returned demonstration, and needs further education  HOME EXERCISE PROGRAM: Access Code: [email or web address]   ASSESSMENT:  CLINICAL IMPRESSION: Pt making progress with pain in neck and UE strength. Seh continues to be challenged with UE strength and ROM, but is improving. She will benefit from continued work on pain free shoulder elevation and functional strength of UEs without pain. She was able to perform active SLR today for first time.  R hip still very weak, but also showing improvements.  Will benefit from continued care to meet LTGs .   Eval: Patient is a 78 y.o. F who was seen today for physical therapy evaluation and treatment for neck and shoulder pain. PMH significant for osteoporosis and history of ACDF C5-6 with chronic history of bilat UE N/T. Assessment is significant for hypomobile C-spine with multiple trigger points throughout her cervical muscles, bilat shoulder and postural weakness (R weaker than L), and postural abnormalities causing issues with reaching and pain. Pt will benefit from PT to improve on these deficits.   OBJECTIVE  IMPAIRMENTS: decreased activity tolerance, decreased coordination, decreased endurance, decreased mobility, decreased ROM, decreased strength, hypomobility, increased fascial restrictions, increased muscle spasms, impaired flexibility, impaired sensation, impaired UE functional use, postural dysfunction, and pain.   ACTIVITY LIMITATIONS: carrying, lifting, bathing, toileting, dressing, self feeding, reach over head, and hygiene/grooming  PARTICIPATION LIMITATIONS: meal prep, cleaning, laundry, driving, and community activity  PERSONAL FACTORS: Age, Fitness, Past/current experiences, and Time since onset of injury/illness/exacerbation are also affecting patient's functional outcome.   REHAB POTENTIAL: Good  CLINICAL DECISION MAKING: Evolving/moderate complexity  EVALUATION COMPLEXITY: Moderate   GOALS: Goals reviewed with patient? Yes  SHORT TERM GOALS: Target date: 07/30/2024   Pt will be ind with initial HEP Baseline:  Goal status: MET  2.  Pt will improve shoulder AROM by at least 10 deg for flexion and abd for overhead reaching Baseline:  Goal status: MET  3.  Pt will report decrease in pain by >/=25% Baseline:  Goal status: MET   LONG TERM GOALS: Target date: 11/01/2024   Pt will be ind with management and progression of HEP Baseline:  Goal status: In progress   2.  Pt will demo improved cervical rotation to at least 60 deg for driving tasks Baseline: 22 deg on L, 37 deg on R Goal status: MET  3.  Pt will improve shoulder flexion and abd AROM to >/=140 deg for reaching into kitchen cabinets Baseline: see ROM chart above Goal status: In progress   4.  Pt will report improved pain by >/=50% Baseline:  Goal status: In progress   5.  Pt will have improved QuickDASH score to </=42.3 to demo MCID Baseline: 52.3 Goal status:   In progress     PLAN:  PT FREQUENCY: 2x/week  PT DURATION: 8 weeks  PLANNED INTERVENTIONS: 97164- PT Re-evaluation, 97750- Physical  Performance Testing, 97110-Therapeutic exercises, 97530- Therapeutic activity, [id number]- Neuromuscular re-education, 97535- Self Care, [id number]- Manual therapy, [id number]- Electrical stimulation (unattended), [id number]- Traction (mechanical), [id number]- Ionotophoresis 4mg /ml Dexamethasone , 79439 (1-2 muscles), 20561 (3+ muscles)- Dry Needling, Patient/Family education, Taping, Joint mobilization, Spinal mobilization, Cryotherapy, and Moist heat  PLAN FOR NEXT SESSION:   Standing hip abd, review clams , continue UE strength and postural strength. Quick dash     Tinnie Don, PT, DPT 09/07/2024, 9:12 PM

## 2024-09-08 ENCOUNTER — Ambulatory Visit (INDEPENDENT_AMBULATORY_CARE_PROVIDER_SITE_OTHER): Admitting: Physical Therapy

## 2024-09-08 ENCOUNTER — Encounter: Payer: Self-pay | Admitting: Physical Therapy

## 2024-09-08 DIAGNOSIS — M5459 Other low back pain: Secondary | ICD-10-CM | POA: Diagnosis not present

## 2024-09-08 DIAGNOSIS — M5412 Radiculopathy, cervical region: Secondary | ICD-10-CM | POA: Diagnosis not present

## 2024-09-08 NOTE — Therapy (Signed)
 OUTPATIENT PHYSICAL THERAPY CERVICAL TREATMENT   Patient Name: Dominica Kent MRN: [id number] DOB:[birth year], 78 y.o., female Today's Date: 09/08/2024   END OF SESSION:  PT End of Session - 09/08/24 1013     Visit Number 10    Number of Visits 16    Date for Recertification  11/01/24    Authorization Type [employer]  10 v through 10/30    PT Start Time 0938    PT Stop Time 1016    PT Time Calculation (min) 38 min    Activity Tolerance Patient tolerated treatment well    Behavior During Therapy WFL for tasks assessed/performed                 Past Medical History:  Diagnosis Date   Abnormal Pap smear    Abnormal peripheral vision    RT EYE   Allergy    Anemia    Arthritis    back and neck   Breast cancer (HCC) 1997   right breast   Bruises easily    Cataracts, bilateral    Chronic back pain    Chronic neck pain    Complication of anesthesia    slow to wake up   Family history of adverse reaction to anesthesia    Dad takes a long time to wake up   Frequency of urination    GERD (gastroesophageal reflux disease)    Glaucoma    Goiter    High cholesterol    History of postoperative nausea and vomiting    Hypertension    Hypothyroidism    Neuromuscular disorder (HCC)    Numbness    hands / feet /arms / legs - followed by Dr. [place] - [place]   Osteoporosis    PONV (postoperative nausea and vomiting)    Post-menopausal    Prediabetes    Sexual dysfunction    Shortness of breath    with exertion and Wt gain   Urinary incontinence    Past Surgical History:  Procedure Laterality Date   BIOPSY THYROID      BREAST SURGERY Right    lymph node removed   CATARACT EXTRACTION, BILATERAL  2009,2010   COLONOSCOPY  02/15/2014   Dr.Stark   EYE SURGERY  08/2012   detached retina surg   LAPAROSCOPY  1978   BTL   lumpectomy  1997   Rt   neck fusion  2010   THYROIDECTOMY N/A 05/04/2013   Procedure:  TOTAL THYROIDECTOMY;  Surgeon: Krystal CHRISTELLA Spinner,  MD;  Location: [facility];  Service: General;  Laterality: N/A;   TOTAL HIP ARTHROPLASTY Right 01/02/2021   Procedure: TOTAL HIP ARTHROPLASTY ANTERIOR APPROACH;  Surgeon: Ernie Cough, MD;  Location: [facility];  Service: Orthopedics;  Laterality: Right;  70 mins   TUBAL LIGATION     Patient Active Problem List   Diagnosis Date Noted   S/P cervical spinal fusion 08/16/2024   Neck pain 08/16/2024   Impaired fasting glucose 03/09/2024   Postoperative hypothyroidism 03/09/2024   Essential hypertension 03/09/2024   Osteoporosis 03/09/2024   Senile osteoporosis 09/02/2023   Prediabetes 06/26/2022   Aortic atherosclerosis 12/26/2021   Polyp of colon 12/26/2021   Familial hypercholesterolemia 10/28/2021   Osteoarthritis of right hip 01/02/2021   Status post right hip replacement 01/02/2021   Primary open angle glaucoma (POAG) of right eye, mild stage 09/13/2018   Diplopia 11/09/2015   Pseudophakia of both eyes 11/09/2015   Right sided weakness 10/28/2013   Cervical dystonia 10/28/2013  Bilateral dry eyes 10/04/2013   S/P total thyroidectomy 08/03/2013   Hypothyroidism 08/03/2013   Other forms of retinal detachment(361.89) 01/18/2013   Urinary incontinence, mixed 01/18/2013   Constipation 01/18/2013   HTN (hypertension) 12/28/2012   History of breast cancer 09/16/2012   Right retinal detachment 09/16/2012   Degeneration of lumbar intervertebral disc 05/30/2012   Multiple thyroid  nodules 04/06/2012   Glaucoma 03/19/2012   Former smoker 03/19/2012   Chronic pain following surgery or procedure 03/19/2012   Mammographic calcification 03/19/2012   Anemia    Cataracts, bilateral    Dyslipidemia (high LDL; low HDL) 09/30/2011   Thyroiditis 09/25/2010   Neck pain, musculoskeletal 07/04/2010   History of detached retina repair 07/04/2010   Cervical cord compression with myelopathy (HCC) 09/28/2009   Family hx of colon cancer 08/15/2009   Chronic back pain 08/15/2009   S/P breast lumpectomy  08/15/2009   Malignant neoplasm of breast (HCC) 04/03/2009   Hyperlipidemia 04/03/2009   Arthritis 04/03/2009   Goiter 04/03/2009    PCP: Wendolyn Jenkins Jansky, MD  REFERRING PROVIDER: Ulis Bottcher, PA-C  REFERRING DIAG: Z98.1 (ICD-10-CM) - S/P cervical spinal fusion M54.2 (ICD-10-CM) - Neck pain  THERAPY DIAG:  Radiculopathy, cervical region  Other low back pain  Rationale for Evaluation and Treatment: Rehabilitation  ONSET DATE: Flare up ~1-2 weeks ago  SUBJECTIVE:                                                                                                                                                                                                         SUBJECTIVE STATEMENT: Pt feels she is doing better overall, neck pain less. Still reports difficult to do things around the house.    Pt has appt with neurosurgeon on  9/22.   Overall, neck/shoulders bothering her, feels neck is a bit better than at previous visit. She has a hard time standing, cooking, meal prep due to pain. She also has pain in back and weakness in legs that she feels is limiting for standing activity. She feels arms are very weak, and R hip also weak from previous THA.     Pt states she has a rod in her neck in 2010. Reports it's been hurting a lot lately. Pt states she was in a lot of pain -- was doing more than usual at the time. Pt is concerned about her diagnosis with osteoporosis. Had the operation when she was in [place]  and didn't get a lot of follow up. Not sure what she is allowed to do. Was told not to reach up  high or hyperextend but not sure if it's for the rest of her life or not. Does have chronic N/T in bilat hands but more on the right. Reports it may be from a disc issue. Does have pain across her back and notes weakness. Has been having more frequent headaches.   Hand dominance: Right  PERTINENT HISTORY:  Osteoporosis, ACDF 2010  PAIN:  Are you having pain? Yes: NPRS scale: 3  currently, at worst 6 Pain location: Back of neck and top of shoulders Pain description: Numbness, constant irritating nagging pain Aggravating factors: doing too much, reaching Relieving factors: new supplements, rest  PRECAUTIONS: None  RED FLAGS: None     WEIGHT BEARING RESTRICTIONS: No  FALLS:  Has patient fallen in last 6 months? No  LIVING ENVIRONMENT: Lives with: lives alone Lives in: House/apartment Stairs: No Has following equipment at home: None  OCCUPATION: Retired  PLOF: Independent  PATIENT GOALS: Improve neck pain and reaching  NEXT MD VISIT: 08/11/24 neurosurgeon  OBJECTIVE:  Updated 10/13  DIAGNOSTIC FINDINGS:  06/16/24 Cervical x-ray IMPRESSION: 1. Anterior fusion plate at R4-R3 without evidence for hardware loosening or acute fracture. 2. Mild degenerative endplate changes at C4-C5 and C6-C7.  MRI 06/29/24 results pending  PATIENT SURVEYS:  QuickDASH Score:   Eval: 52.3 / 100 = 52.3 % QuickDASH Score:   10/15:    43%    COGNITION: Overall cognitive status: Within functional limits for tasks assessed  SENSATION: Chronic N/T in bilat hands  POSTURE: rounded shoulders, forward head, and decreased thoracic kyphosis  PALPATION:    CERVICAL ROM:   Active ROM A/PROM (deg) eval 07/28/24  10/13 neck  Flexion 26  Wfl   Extension 15  25 no pain   Right lateral flexion 20    Left lateral flexion 15    Right rotation 22 55 60  Left rotation 37 65 68   (Blank rows = not tested)  UPPER EXTREMITY MMT (within available range)  MMT: Right eval Left eval Right  10/13 Left 10/13  Shoulder flexion 3 pulls in neck 3+ pulls in neck 4 4  Shoulder extension      Shoulder abduction 3 3+ 4- 4-  Shoulder adduction      Shoulder extension 4 4    Shoulder internal rotation 3+ 4    Shoulder external rotation 3+ 4    Elbow flexion      Elbow extension      Wrist flexion      Wrist extension      Wrist ulnar deviation      Wrist radial  deviation      Wrist pronation      Wrist supination       (Blank rows = not tested)   UPPER EXTREMITY ROM:  AROM Right eval Left eval Right 09/01/24 Left 09/01/24  Shoulder flexion 100 105 128 135  Shoulder extension seated 55 70    Shoulder abduction 110 110    Shoulder adduction      Shoulder extension prone      Shoulder internal rotation T12 T10    Shoulder external rotation 70 70    Middle trapezius      Lower trapezius      Elbow flexion      Elbow extension      Wrist flexion      Wrist extension      Wrist ulnar deviation      Wrist radial deviation      Wrist pronation  Wrist supination      Grip strength       (Blank rows = not tested)  CERVICAL SPECIAL TESTS:    FUNCTIONAL TESTS:    TREATMENT DATE:          09/06/24 Therapeutic Exercise: Aerobic: Supine:    SLR  2  x 5 on R ;   Seated:  bicep curls 3 lb  2 x 10   S/L:  clams x 15 on R;   hip abd 2 x10  on R  Standing:   Scap squeeze x 12;  Rows BlueTB x 15;  Hip abd 2 x 10 bil; Stretches:   SKTC 30 sec x 3;   Neuromuscular Re-education: Manual Therapy:   Therapeutic Activity:progression for reaching  shoulder flexion aarom x 10  Shoulder elevation/arom x 10; shoulder abd to 90 deg - arom 2 x 8  Wall push ups x 12 ;   Self Care:   09/01/24           Therapeutic Exercise: Aerobic: Supine:  Supine march with TA (for hip and core strength)  x 10;    SLR(knee bent) 2 x 5;   bridging x 10- cueing for form. - very  challenging  Seated: bicep curls 3 lb  2 x 8 (sitting )  S/L:  Standing:   Rows GTB x 15;  Shoulder elevation/arom x 10 Wall push ups 2 x 8  Stretches:   Neuromuscular Re-education: Manual Therapy:   Therapeutic Activity: Self Care:                                                                                                      08/25/2024 Therapeutic Exercise: Aerobic: Supine:  Supine march with TA (for hip and core strength) 2 x 10;   Clams GTB x 15  SLR(knee bent) 2  x 5;   Seated: bicep curls 3 lb  2 x 8 (tolerated better in sitting )  S/L:  clams x10 then x 8  on R  Standing:   Rows GTB x 15;  Shoulder ER YTB 2 x 10;  Shoulder elevation/arom x 10 Wall push ups 2 x 8  Marching x 20; review of osteoporosis exercises.   Stretches:  LTR x10;  Neuromuscular Re-education: Manual Therapy:   Therapeutic Activity: Self Care:   PATIENT EDUCATION:  Education details: updated and reviewed HEP Person educated: Patient Education method: Explanation, Demonstration, and Handouts Education comprehension: verbalized understanding, returned demonstration, and needs further education  HOME EXERCISE PROGRAM: Access Code: [email or web address]   ASSESSMENT:  CLINICAL IMPRESSION: Pt making progress with pain in neck and UE strength. Seh continues to be challenged with UE strength and ROM, but is improving. She will benefit from continued work on pain free shoulder elevation and functional strength of UEs without pain. She reports decreased pain levels, but still with functional deficits with quick dash and reports still having difficulty at home with heavier activities due to weakness, fatigue and pain.    Eval: Patient is a 78 y.o. F who was seen today for physical therapy evaluation  and treatment for neck and shoulder pain. PMH significant for osteoporosis and history of ACDF C5-6 with chronic history of bilat UE N/T. Assessment is significant for hypomobile C-spine with multiple trigger points throughout her cervical muscles, bilat shoulder and postural weakness (R weaker than L), and postural abnormalities causing issues with reaching and pain. Pt will benefit from PT to improve on these deficits.   OBJECTIVE IMPAIRMENTS: decreased activity tolerance, decreased coordination, decreased endurance, decreased mobility, decreased ROM, decreased strength, hypomobility, increased fascial restrictions, increased muscle spasms, impaired flexibility, impaired sensation, impaired UE  functional use, postural dysfunction, and pain.   ACTIVITY LIMITATIONS: carrying, lifting, bathing, toileting, dressing, self feeding, reach over head, and hygiene/grooming  PARTICIPATION LIMITATIONS: meal prep, cleaning, laundry, driving, and community activity  PERSONAL FACTORS: Age, Fitness, Past/current experiences, and Time since onset of injury/illness/exacerbation are also affecting patient's functional outcome.   REHAB POTENTIAL: Good  CLINICAL DECISION MAKING: Evolving/moderate complexity  EVALUATION COMPLEXITY: Moderate   GOALS: Goals reviewed with patient? Yes  SHORT TERM GOALS: Target date: 07/30/2024   Pt will be ind with initial HEP Baseline:  Goal status: MET  2.  Pt will improve shoulder AROM by at least 10 deg for flexion and abd for overhead reaching Baseline:  Goal status: MET  3.  Pt will report decrease in pain by >/=25% Baseline:  Goal status: MET   LONG TERM GOALS: Target date: 11/01/2024   Pt will be ind with management and progression of HEP Baseline:  Goal status: In progress   2.  Pt will demo improved cervical rotation to at least 60 deg for driving tasks Baseline: 22 deg on L, 37 deg on R Goal status: MET  3.  Pt will improve shoulder flexion and abd AROM to >/=140 deg for reaching into kitchen cabinets Baseline: see ROM chart above Goal status: In progress   4.  Pt will report improved pain by >/=50% Baseline:  Goal status: In progress   5.  Pt will have improved QuickDASH score to </=42.3 to demo MCID Baseline: 52.3 Goal status:   In progress     PLAN:  PT FREQUENCY: 2x/week  PT DURATION: 8 weeks  PLANNED INTERVENTIONS: [id number]- PT Re-evaluation, 97750- Physical Performance Testing, 97110-Therapeutic exercises, 97530- Therapeutic activity, [id number]- Neuromuscular re-education, 97535- Self Care, [id number]- Manual therapy, [id number]- Electrical stimulation (unattended), [phone]- Traction (mechanical), [id number]- Ionotophoresis 4mg /ml  Dexamethasone , 79439 (1-2 muscles), 20561 (3+ muscles)- Dry Needling, Patient/Family education, Taping, Joint mobilization, Spinal mobilization, Cryotherapy, and Moist heat  PLAN FOR NEXT SESSION:   Standing hip abd, review clams , continue UE strength and postural strength.  Quick dash , [employer] resumbittted today    Tinnie Don, PT, DPT 09/08/2024, 7:52 PM

## 2024-09-13 ENCOUNTER — Other Ambulatory Visit: Payer: Self-pay | Admitting: Family Medicine

## 2024-09-13 ENCOUNTER — Encounter: Payer: Self-pay | Admitting: Physical Therapy

## 2024-09-13 ENCOUNTER — Ambulatory Visit (INDEPENDENT_AMBULATORY_CARE_PROVIDER_SITE_OTHER): Admitting: Physical Therapy

## 2024-09-13 DIAGNOSIS — M5412 Radiculopathy, cervical region: Secondary | ICD-10-CM | POA: Diagnosis not present

## 2024-09-13 DIAGNOSIS — M5459 Other low back pain: Secondary | ICD-10-CM

## 2024-09-13 DIAGNOSIS — R2689 Other abnormalities of gait and mobility: Secondary | ICD-10-CM

## 2024-09-13 NOTE — Therapy (Signed)
 OUTPATIENT PHYSICAL THERAPY CERVICAL TREATMENT   Patient Name: [name] [name] MRN: [id number] DOB:1946-10-31, 78 y.o., female Today's Date: 09/13/2024    END OF SESSION:  PT End of Session - 09/13/24 0940     Visit Number 11    Number of Visits 16    Date for Recertification  11/01/24    Authorization Type [employer]  14 v through 11/17    PT Start Time 0932    PT Stop Time 1015    PT Time Calculation (min) 43 min    Activity Tolerance Patient tolerated treatment well    Behavior During Therapy WFL for tasks assessed/performed                 Past Medical History:  Diagnosis Date   Abnormal Pap smear    Abnormal peripheral vision    RT EYE   Allergy    Anemia    Arthritis    back and neck   Breast cancer (HCC) 1997   right breast   Bruises easily    Cataracts, bilateral    Chronic back pain    Chronic neck pain    Complication of anesthesia    slow to wake up   Family history of adverse reaction to anesthesia    Dad takes a long time to wake up   Frequency of urination    GERD (gastroesophageal reflux disease)    Glaucoma    Goiter    High cholesterol    History of postoperative nausea and vomiting    Hypertension    Hypothyroidism    Neuromuscular disorder (HCC)    Numbness    hands / feet /arms / legs - followed by Dr. [place] - [place]   Osteoporosis    PONV (postoperative nausea and vomiting)    Post-menopausal    Prediabetes    Sexual dysfunction    Shortness of breath    with exertion and Wt gain   Urinary incontinence    Past Surgical History:  Procedure Laterality Date   BIOPSY THYROID      BREAST SURGERY Right    lymph node removed   CATARACT EXTRACTION, BILATERAL  2009,2010   COLONOSCOPY  02/15/2014   Dr.Stark   EYE SURGERY  08/2012   detached retina surg   LAPAROSCOPY  1978   BTL   lumpectomy  1997   Rt   neck fusion  2010   THYROIDECTOMY N/A 05/04/2013   Procedure:  TOTAL THYROIDECTOMY;  Surgeon: Krystal CHRISTELLA Spinner, MD;  Location: [facility];  Service: General;  Laterality: N/A;   TOTAL HIP ARTHROPLASTY Right 01/02/2021   Procedure: TOTAL HIP ARTHROPLASTY ANTERIOR APPROACH;  Surgeon: Ernie Cough, MD;  Location: [facility];  Service: Orthopedics;  Laterality: Right;  70 mins   TUBAL LIGATION     Patient Active Problem List   Diagnosis Date Noted   S/P cervical spinal fusion 08/16/2024   Neck pain 08/16/2024   Impaired fasting glucose 03/09/2024   Postoperative hypothyroidism 03/09/2024   Essential hypertension 03/09/2024   Osteoporosis 03/09/2024   Senile osteoporosis 09/02/2023   Prediabetes 06/26/2022   Aortic atherosclerosis 12/26/2021   Polyp of colon 12/26/2021   Familial hypercholesterolemia 10/28/2021   Osteoarthritis of right hip 01/02/2021   Status post right hip replacement 01/02/2021   Primary open angle glaucoma (POAG) of right eye, mild stage 09/13/2018   Diplopia 11/09/2015   Pseudophakia of both eyes 11/09/2015   Right sided weakness 10/28/2013   Cervical dystonia  10/28/2013   Bilateral dry eyes 10/04/2013   S/P total thyroidectomy 08/03/2013   Hypothyroidism 08/03/2013   Other forms of retinal detachment(361.89) 01/18/2013   Urinary incontinence, mixed 01/18/2013   Constipation 01/18/2013   HTN (hypertension) 12/28/2012   History of breast cancer 09/16/2012   Right retinal detachment 09/16/2012   Degeneration of lumbar intervertebral disc 05/30/2012   Multiple thyroid  nodules 04/06/2012   Glaucoma 03/19/2012   Former smoker 03/19/2012   Chronic pain following surgery or procedure 03/19/2012   Mammographic calcification 03/19/2012   Anemia    Cataracts, bilateral    Dyslipidemia (high LDL; low HDL) 09/30/2011   Thyroiditis 09/25/2010   Neck pain, musculoskeletal 07/04/2010   History of detached retina repair 07/04/2010   Cervical cord compression with myelopathy (HCC) 09/28/2009   Family hx of colon cancer 08/15/2009   Chronic back pain 08/15/2009   S/P breast  lumpectomy 08/15/2009   Malignant neoplasm of breast (HCC) 04/03/2009   Hyperlipidemia 04/03/2009   Arthritis 04/03/2009   Goiter 04/03/2009    PCP: Wendolyn Jenkins Jansky, MD  REFERRING PROVIDER: Ulis Bottcher, PA-C  REFERRING DIAG: Z98.1 (ICD-10-CM) - S/P cervical spinal fusion M54.2 (ICD-10-CM) - Neck pain  THERAPY DIAG:  Radiculopathy, cervical region  Other low back pain  Other abnormalities of gait and mobility  Rationale for Evaluation and Treatment: Rehabilitation  ONSET DATE: Flare up ~1-2 weeks ago  SUBJECTIVE:                                                                                                                                                                                                         SUBJECTIVE STATEMENT: Pt feels arms/neck doing better. R hip still bothersome and legs feel weak. States overall fatigue with daily activities that has not improved much. She is trying to get scheduled with cardiology.    Pt has appt with neurosurgeon on  9/22.   Overall, neck/shoulders bothering her, feels neck is a bit better than at previous visit. She has a hard time standing, cooking, meal prep due to pain. She also has pain in back and weakness in legs that she feels is limiting for standing activity. She feels arms are very weak, and R hip also weak from previous THA.     Pt states she has a rod in her neck in 2010. Reports it's been hurting a lot lately. Pt states she was in a lot of pain -- was doing more than usual at the time. Pt is concerned about her diagnosis with osteoporosis. Had the operation when she was in  [place]  and didn't get a lot of follow up. Not sure what she is allowed to do. Was told not to reach up high or hyperextend but not sure if it's for the rest of her life or not. Does have chronic N/T in bilat hands but more on the right. Reports it may be from a disc issue. Does have pain across her back and notes weakness. Has been having more frequent  headaches.   Hand dominance: Right  PERTINENT HISTORY:  Osteoporosis, ACDF 2010  PAIN:  Are you having pain? Yes: NPRS scale: 3 currently, at worst 6 Pain location: Back of neck and top of shoulders Pain description: Numbness, constant irritating nagging pain Aggravating factors: doing too much, reaching Relieving factors: new supplements, rest  PRECAUTIONS: None  RED FLAGS: None     WEIGHT BEARING RESTRICTIONS: No  FALLS:  Has patient fallen in last 6 months? No  LIVING ENVIRONMENT: Lives with: lives alone Lives in: House/apartment Stairs: No Has following equipment at home: None  OCCUPATION: Retired  PLOF: Independent  PATIENT GOALS: Improve neck pain and reaching  NEXT MD VISIT: 08/11/24 neurosurgeon  OBJECTIVE:  Updated 10/13  DIAGNOSTIC FINDINGS:  06/16/24 Cervical x-ray IMPRESSION: 1. Anterior fusion plate at R4-R3 without evidence for hardware loosening or acute fracture. 2. Mild degenerative endplate changes at C4-C5 and C6-C7.  MRI 06/29/24 results pending  PATIENT SURVEYS:  QuickDASH Score:   Eval: 52.3 / 100 = 52.3 % QuickDASH Score:   10/15:    43%    COGNITION: Overall cognitive status: Within functional limits for tasks assessed  SENSATION: Chronic N/T in bilat hands  POSTURE: rounded shoulders, forward head, and decreased thoracic kyphosis  PALPATION:    CERVICAL ROM:   Active ROM A/PROM (deg) eval 07/28/24  10/13 neck  Flexion 26  Wfl   Extension 15  25 no pain   Right lateral flexion 20    Left lateral flexion 15    Right rotation 22 55 60  Left rotation 37 65 68   (Blank rows = not tested)  UPPER EXTREMITY MMT (within available range)  MMT: Right eval Left eval Right  10/13 Left 10/13  Shoulder flexion 3 pulls in neck 3+ pulls in neck 4 4  Shoulder extension      Shoulder abduction 3 3+ 4- 4-  Shoulder adduction      Shoulder extension 4 4    Shoulder internal rotation 3+ 4    Shoulder external rotation 3+ 4     Elbow flexion      Elbow extension      Wrist flexion      Wrist extension      Wrist ulnar deviation      Wrist radial deviation      Wrist pronation      Wrist supination       (Blank rows = not tested)   UPPER EXTREMITY ROM:  AROM Right eval Left eval Right 09/01/24 Left 09/01/24  Shoulder flexion 100 105 128 135  Shoulder extension seated 55 70    Shoulder abduction 110 110    Shoulder adduction      Shoulder extension prone      Shoulder internal rotation T12 T10    Shoulder external rotation 70 70    Middle trapezius      Lower trapezius      Elbow flexion      Elbow extension      Wrist flexion      Wrist extension  Wrist ulnar deviation      Wrist radial deviation      Wrist pronation      Wrist supination      Grip strength       (Blank rows = not tested)  CERVICAL SPECIAL TESTS:    FUNCTIONAL TESTS:    TREATMENT DATE:          09/13/24: Therapeutic Exercise: Aerobic: Supine:    SLR  2  x 5 on R ;  clams GTB x 20;  Hip Er fallouts x 15;  Seated:   S/L:   Standing:    Marching for hip flexion x 15;   Hip abd 2 x 10 bil; Step ups 6 in x10 bil;  Stretches:   SKTC 30 sec x 3;   Neuromuscular Re-education: Manual Therapy:    Therapeutic Activity:   Self Care:   09/06/24 Therapeutic Exercise: Aerobic: Supine:    SLR  2  x 5 on R ;   Seated:  bicep curls 3 lb  2 x 10   S/L:  clams x 15 on R;   hip abd 2 x10  on R  Standing:   Scap squeeze x 12;  Rows BlueTB x 15;  Hip abd 2 x 10 bil; Stretches:   SKTC 30 sec x 3;   Neuromuscular Re-education: Manual Therapy:   Therapeutic Activity:  progression for reaching  shoulder flexion aarom x 10  Shoulder elevation/arom x 10; shoulder abd to 90 deg - arom 2 x 8  Wall push ups x 12 ;  Self Care:   09/01/24           Therapeutic Exercise: Aerobic: Supine:  Supine march with TA (for hip and core strength)  x 10;    SLR(knee bent) 2 x 5;   bridging x 10- cueing for form. - very  challenging   Seated: bicep curls 3 lb  2 x 8 (sitting )  S/L:  Standing:   Rows GTB x 15;  Shoulder elevation/arom x 10 Wall push ups 2 x 8  Stretches:   Neuromuscular Re-education: Manual Therapy:   Therapeutic Activity: Self Care:   PATIENT EDUCATION:  Education details: updated and reviewed HEP Person educated: Patient Education method: Explanation, Demonstration, and Handouts Education comprehension: verbalized understanding, returned demonstration, and needs further education  HOME EXERCISE PROGRAM: Access Code: [email or web address]   ASSESSMENT:  CLINICAL IMPRESSION: Discussed overall fatigue vs muscle fatigue/soreness from activity/strengthening exercises. Pt will f/u with md and cardiology to discuss. We reviewed trying to do HEP every other day, and not over doing activity daily, but I do not think the current HEP for light strengthening will cause her the amount of fatigue she is describing. R hip continues to be very weak for flexion motion. Pt to benefit from continued care.    Eval: Pt making progress with pain in neck and UE strength. Seh continues to be challenged with UE strength and ROM, but is improving. She will benefit from continued work on pain free shoulder elevation and functional strength of UEs without pain. She reports decreased pain levels, but still with functional deficits with quick dash and reports still having difficulty at home with heavier activities due to weakness, fatigue and pain.    Eval: Patient is a 78 y.o. F who was seen today for physical therapy evaluation and treatment for neck and shoulder pain. PMH significant for osteoporosis and history of ACDF C5-6 with chronic history of bilat UE N/T. Assessment is significant  for hypomobile C-spine with multiple trigger points throughout her cervical muscles, bilat shoulder and postural weakness (R weaker than L), and postural abnormalities causing issues with reaching and pain. Pt will benefit from PT to improve on these  deficits.   OBJECTIVE IMPAIRMENTS: decreased activity tolerance, decreased coordination, decreased endurance, decreased mobility, decreased ROM, decreased strength, hypomobility, increased fascial restrictions, increased muscle spasms, impaired flexibility, impaired sensation, impaired UE functional use, postural dysfunction, and pain.   ACTIVITY LIMITATIONS: carrying, lifting, bathing, toileting, dressing, self feeding, reach over head, and hygiene/grooming  PARTICIPATION LIMITATIONS: meal prep, cleaning, laundry, driving, and community activity  PERSONAL FACTORS: Age, Fitness, Past/current experiences, and Time since onset of injury/illness/exacerbation are also affecting patient's functional outcome.   REHAB POTENTIAL: Good  CLINICAL DECISION MAKING: Evolving/moderate complexity  EVALUATION COMPLEXITY: Moderate   GOALS: Goals reviewed with patient? Yes  SHORT TERM GOALS: Target date: 07/30/2024   Pt will be ind with initial HEP Baseline:  Goal status: MET  2.  Pt will improve shoulder AROM by at least 10 deg for flexion and abd for overhead reaching Baseline:  Goal status: MET  3.  Pt will report decrease in pain by >/=25% Baseline:  Goal status: MET   LONG TERM GOALS: Target date: 11/01/2024   Pt will be ind with management and progression of HEP Baseline:  Goal status: In progress   2.  Pt will demo improved cervical rotation to at least 60 deg for driving tasks Baseline: 22 deg on L, 37 deg on R Goal status: MET  3.  Pt will improve shoulder flexion and abd AROM to >/=140 deg for reaching into kitchen cabinets Baseline: see ROM chart above Goal status: In progress   4.  Pt will report improved pain by >/=50% Baseline:  Goal status: In progress   5.  Pt will have improved QuickDASH score to </=42.3 to demo MCID Baseline: 52.3 Goal status:   In progress     PLAN:  PT FREQUENCY: 2x/week  PT DURATION: 8 weeks  PLANNED INTERVENTIONS: [id number]- PT  Re-evaluation, 97750- Physical Performance Testing, 97110-Therapeutic exercises, 97530- Therapeutic activity, [id number]- Neuromuscular re-education, 97535- Self Care, [id number]- Manual therapy, [id number]- Electrical stimulation (unattended), [phone]- Traction (mechanical), [id number]- Ionotophoresis 4mg /ml Dexamethasone , 79439 (1-2 muscles), 20561 (3+ muscles)- Dry Needling, Patient/Family education, Taping, Joint mobilization, Spinal mobilization, Cryotherapy, and Moist heat  PLAN FOR NEXT SESSION:   Standing hip abd, review clams , continue UE strength and postural strength.  Quick dash , [employer] resumbittted today    Tinnie Don, PT, DPT 09/13/2024, 9:50 AM

## 2024-09-15 ENCOUNTER — Encounter: Admitting: Physical Therapy

## 2024-09-20 ENCOUNTER — Encounter: Payer: Self-pay | Admitting: Physical Therapy

## 2024-09-20 ENCOUNTER — Ambulatory Visit (INDEPENDENT_AMBULATORY_CARE_PROVIDER_SITE_OTHER): Admitting: Physical Therapy

## 2024-09-20 DIAGNOSIS — M5412 Radiculopathy, cervical region: Secondary | ICD-10-CM

## 2024-09-20 DIAGNOSIS — M5459 Other low back pain: Secondary | ICD-10-CM | POA: Diagnosis not present

## 2024-09-20 NOTE — Therapy (Signed)
 OUTPATIENT PHYSICAL THERAPY CERVICAL TREATMENT   Patient Name: Duha Abair MRN: [id number] DOB:1946-07-14, 78 y.o., female Today's Date: 09/20/2024    END OF SESSION:  PT End of Session - 09/20/24 0921     Visit Number 12    Number of Visits 16    Date for Recertification  11/01/24    Authorization Type [employer] Medicare  14 v through 11/17    PT Start Time 0930    PT Stop Time 1013    PT Time Calculation (min) 43 min    Activity Tolerance Patient tolerated treatment well    Behavior During Therapy WFL for tasks assessed/performed                 Past Medical History:  Diagnosis Date   Abnormal Pap smear    Abnormal peripheral vision    RT EYE   Allergy    Anemia    Arthritis    back and neck   Breast cancer (HCC) 1997   right breast   Bruises easily    Cataracts, bilateral    Chronic back pain    Chronic neck pain    Complication of anesthesia    slow to wake up   Family history of adverse reaction to anesthesia    Dad takes a long time to wake up   Frequency of urination    GERD (gastroesophageal reflux disease)    Glaucoma    Goiter    High cholesterol    History of postoperative nausea and vomiting    Hypertension    Hypothyroidism    Neuromuscular disorder (HCC)    Numbness    hands / feet /arms / legs - followed by Dr. [place] - [place]   Osteoporosis    PONV (postoperative nausea and vomiting)    Post-menopausal    Prediabetes    Sexual dysfunction    Shortness of breath    with exertion and Wt gain   Urinary incontinence    Past Surgical History:  Procedure Laterality Date   BIOPSY THYROID      BREAST SURGERY Right    lymph node removed   CATARACT EXTRACTION, BILATERAL  2009,2010   COLONOSCOPY  02/15/2014   Dr.Stark   EYE SURGERY  08/2012   detached retina surg   LAPAROSCOPY  1978   BTL   lumpectomy  1997   Rt   neck fusion  2010   THYROIDECTOMY N/A 05/04/2013   Procedure:  TOTAL THYROIDECTOMY;  Surgeon: Krystal CHRISTELLA Spinner, MD;  Location: [facility];  Service: General;  Laterality: N/A;   TOTAL HIP ARTHROPLASTY Right 01/02/2021   Procedure: TOTAL HIP ARTHROPLASTY ANTERIOR APPROACH;  Surgeon: Ernie Cough, MD;  Location: [facility];  Service: Orthopedics;  Laterality: Right;  70 mins   TUBAL LIGATION     Patient Active Problem List   Diagnosis Date Noted   S/P cervical spinal fusion 08/16/2024   Neck pain 08/16/2024   Impaired fasting glucose 03/09/2024   Postoperative hypothyroidism 03/09/2024   Essential hypertension 03/09/2024   Osteoporosis 03/09/2024   Senile osteoporosis 09/02/2023   Prediabetes 06/26/2022   Aortic atherosclerosis 12/26/2021   Polyp of colon 12/26/2021   Familial hypercholesterolemia 10/28/2021   Osteoarthritis of right hip 01/02/2021   Status post right hip replacement 01/02/2021   Primary open angle glaucoma (POAG) of right eye, mild stage 09/13/2018   Diplopia 11/09/2015   Pseudophakia of both eyes 11/09/2015   Right sided weakness 10/28/2013   Cervical dystonia  10/28/2013   Bilateral dry eyes 10/04/2013   S/P total thyroidectomy 08/03/2013   Hypothyroidism 08/03/2013   Other forms of retinal detachment(361.89) 01/18/2013   Urinary incontinence, mixed 01/18/2013   Constipation 01/18/2013   HTN (hypertension) 12/28/2012   History of breast cancer 09/16/2012   Right retinal detachment 09/16/2012   Degeneration of lumbar intervertebral disc 05/30/2012   Multiple thyroid  nodules 04/06/2012   Glaucoma 03/19/2012   Former smoker 03/19/2012   Chronic pain following surgery or procedure 03/19/2012   Mammographic calcification 03/19/2012   Anemia    Cataracts, bilateral    Dyslipidemia (high LDL; low HDL) 09/30/2011   Thyroiditis 09/25/2010   Neck pain, musculoskeletal 07/04/2010   History of detached retina repair 07/04/2010   Cervical cord compression with myelopathy (HCC) 09/28/2009   Family hx of colon cancer 08/15/2009   Chronic back pain 08/15/2009   S/P breast  lumpectomy 08/15/2009   Malignant neoplasm of breast (HCC) 04/03/2009   Hyperlipidemia 04/03/2009   Arthritis 04/03/2009   Goiter 04/03/2009    PCP: Wendolyn Jenkins Jansky, MD  REFERRING PROVIDER: Ulis Bottcher, PA-C  REFERRING DIAG: Z98.1 (ICD-10-CM) - S/P cervical spinal fusion M54.2 (ICD-10-CM) - Neck pain  THERAPY DIAG:  Radiculopathy, cervical region  Other low back pain  Rationale for Evaluation and Treatment: Rehabilitation  ONSET DATE: Flare up ~1-2 weeks ago  SUBJECTIVE:                                                                                                                                                                                                         SUBJECTIVE STATEMENT: Pt feels shoulder blades  are still sore with increased activity with UEs in kitchen. Feels leg is getting stronger   Pt has appt with neurosurgeon on  9/22.   Overall, neck/shoulders bothering her, feels neck is a bit better than at previous visit. She has a hard time standing, cooking, meal prep due to pain. She also has pain in back and weakness in legs that she feels is limiting for standing activity. She feels arms are very weak, and R hip also weak from previous THA.     Pt states she has a rod in her neck in 2010. Reports it's been hurting a lot lately. Pt states she was in a lot of pain -- was doing more than usual at the time. Pt is concerned about her diagnosis with osteoporosis. Had the operation when she was in [place]  and didn't get a lot of follow up. Not sure what she is allowed to do. Was told not  to reach up high or hyperextend but not sure if it's for the rest of her life or not. Does have chronic N/T in bilat hands but more on the right. Reports it may be from a disc issue. Does have pain across her back and notes weakness. Has been having more frequent headaches.   Hand dominance: Right  PERTINENT HISTORY:  Osteoporosis, ACDF 2010  PAIN:  Are you having pain? Yes: NPRS  scale: 3 currently, at worst 6 Pain location: Back of neck and top of shoulders Pain description: Numbness, constant irritating nagging pain Aggravating factors: doing too much, reaching Relieving factors: new supplements, rest  PRECAUTIONS: None  RED FLAGS: None     WEIGHT BEARING RESTRICTIONS: No  FALLS:  Has patient fallen in last 6 months? No  LIVING ENVIRONMENT: Lives with: lives alone Lives in: House/apartment Stairs: No Has following equipment at home: None  OCCUPATION: Retired  PLOF: Independent  PATIENT GOALS: Improve neck pain and reaching  NEXT MD VISIT: 08/11/24 neurosurgeon  OBJECTIVE:  Updated 10/13  DIAGNOSTIC FINDINGS:  06/16/24 Cervical x-ray IMPRESSION: 1. Anterior fusion plate at R4-R3 without evidence for hardware loosening or acute fracture. 2. Mild degenerative endplate changes at C4-C5 and C6-C7.  MRI 06/29/24 results pending  PATIENT SURVEYS:  QuickDASH Score:   Eval: 52.3 / 100 = 52.3 % QuickDASH Score:   10/15:    43%    COGNITION: Overall cognitive status: Within functional limits for tasks assessed  SENSATION: Chronic N/T in bilat hands  POSTURE: rounded shoulders, forward head, and decreased thoracic kyphosis  PALPATION:    CERVICAL ROM:   Active ROM A/PROM (deg) eval 07/28/24  10/13 neck  Flexion 26  Wfl   Extension 15  25 no pain   Right lateral flexion 20    Left lateral flexion 15    Right rotation 22 55 60  Left rotation 37 65 68   (Blank rows = not tested)  UPPER EXTREMITY MMT (within available range)  MMT: Right eval Left eval Right  10/13 Left 10/13  Shoulder flexion 3 pulls in neck 3+ pulls in neck 4 4  Shoulder extension      Shoulder abduction 3 3+ 4- 4-  Shoulder adduction      Shoulder extension 4 4    Shoulder internal rotation 3+ 4    Shoulder external rotation 3+ 4    Elbow flexion      Elbow extension      Wrist flexion      Wrist extension      Wrist ulnar deviation      Wrist  radial deviation      Wrist pronation      Wrist supination       (Blank rows = not tested)   UPPER EXTREMITY ROM:  AROM Right eval Left eval Right 09/01/24 Left 09/01/24  Shoulder flexion 100 105 128 135  Shoulder extension seated 55 70    Shoulder abduction 110 110    Shoulder adduction      Shoulder extension prone      Shoulder internal rotation T12 T10    Shoulder external rotation 70 70    Middle trapezius      Lower trapezius      Elbow flexion      Elbow extension      Wrist flexion      Wrist extension      Wrist ulnar deviation      Wrist radial deviation  Wrist pronation      Wrist supination      Grip strength       (Blank rows = not tested)  CERVICAL SPECIAL TESTS:    FUNCTIONAL TESTS:    TREATMENT DATE:          09/13/24: Therapeutic Exercise: Aerobic: Supine:   bridging x 10 (difficult to get  proper mechanics)  SLR  2  x 5 on R , 2 x 10 on L  ;  clams GTB x 20;  Supine march GTB x 15 alternating with TA;  Hip ER  fallouts x 15;   LTR x 10  Seated:   sit to stand 3 x 5  S/L:   Standing:    Stretches:   SKTC 30 sec x 3;   Neuromuscular Re-education: Manual Therapy:    Therapeutic Activity:   Marching for hip flexion x 15;   Hip abd 2 x 10 bil;  Step ups 6 in x10 bil;  Rows GTB x 15 Shoulder elevation x 10 Education on posture for kitchen counter  Self Care:   09/06/24 Therapeutic Exercise: Aerobic: Supine:    SLR  2  x 5 on R ;   Seated:  bicep curls 3 lb  2 x 10   S/L:  clams x 15 on R;   hip abd 2 x10  on R  Standing:   Scap squeeze x 12;  Rows BlueTB x 15;  Hip abd 2 x 10 bil; Stretches:   SKTC 30 sec x 3;   Neuromuscular Re-education: Manual Therapy:   Therapeutic Activity:  progression for reaching  shoulder flexion aarom x 10  Shoulder elevation/arom x 10; shoulder abd to 90 deg - arom 2 x 8  Wall push ups x 12 ;  Self Care:   09/01/24           Therapeutic Exercise: Aerobic: Supine:  Supine march with TA (for  hip and core strength)  x 10;    SLR(knee bent) 2 x 5;   bridging x 10- cueing for form. - very  challenging  Seated: bicep curls 3 lb  2 x 8 (sitting )  S/L:  Standing:   Rows GTB x 15;  Shoulder elevation/arom x 10 Wall push ups 2 x 8  Stretches:   Neuromuscular Re-education: Manual Therapy:   Therapeutic Activity: Self Care:   PATIENT EDUCATION:  Education details: updated and reviewed HEP Person educated: Patient Education method: Programmer, Multimedia, Demonstration, and Handouts Education comprehension: verbalized understanding, returned demonstration, and needs further education  HOME EXERCISE PROGRAM: Access Code: [email or web address]   ASSESSMENT:  CLINICAL IMPRESSION:  Continued discussion on energy conservation for performing light exercise but not over doing activity to cause too much fatigue. Pt doing well with progression of standing exercises, will benefit from progression of this and continued care.    Eval: Pt making progress with pain in neck and UE strength. Seh continues to be challenged with UE strength and ROM, but is improving. She will benefit from continued work on pain free shoulder elevation and functional strength of UEs without pain. She reports decreased pain levels, but still with functional deficits with quick dash and reports still having difficulty at home with heavier activities due to weakness, fatigue and pain.    Eval: Patient is a 78 y.o. F who was seen today for physical therapy evaluation and treatment for neck and shoulder pain. PMH significant for osteoporosis and history of ACDF C5-6 with chronic history of bilat  UE N/T. Assessment is significant for hypomobile C-spine with multiple trigger points throughout her cervical muscles, bilat shoulder and postural weakness (R weaker than L), and postural abnormalities causing issues with reaching and pain. Pt will benefit from PT to improve on these deficits.   OBJECTIVE IMPAIRMENTS: decreased activity tolerance,  decreased coordination, decreased endurance, decreased mobility, decreased ROM, decreased strength, hypomobility, increased fascial restrictions, increased muscle spasms, impaired flexibility, impaired sensation, impaired UE functional use, postural dysfunction, and pain.   ACTIVITY LIMITATIONS: carrying, lifting, bathing, toileting, dressing, self feeding, reach over head, and hygiene/grooming  PARTICIPATION LIMITATIONS: meal prep, cleaning, laundry, driving, and community activity  PERSONAL FACTORS: Age, Fitness, Past/current experiences, and Time since onset of injury/illness/exacerbation are also affecting patient's functional outcome.   REHAB POTENTIAL: Good  CLINICAL DECISION MAKING: Evolving/moderate complexity  EVALUATION COMPLEXITY: Moderate   GOALS: Goals reviewed with patient? Yes  SHORT TERM GOALS: Target date: 07/30/2024   Pt will be ind with initial HEP Baseline:  Goal status: MET  2.  Pt will improve shoulder AROM by at least 10 deg for flexion and abd for overhead reaching Baseline:  Goal status: MET  3.  Pt will report decrease in pain by >/=25% Baseline:  Goal status: MET   LONG TERM GOALS: Target date: 11/01/2024   Pt will be ind with management and progression of HEP Baseline:  Goal status: In progress   2.  Pt will demo improved cervical rotation to at least 60 deg for driving tasks Baseline: 22 deg on L, 37 deg on R Goal status: MET  3.  Pt will improve shoulder flexion and abd AROM to >/=140 deg for reaching into kitchen cabinets Baseline: see ROM chart above Goal status: In progress   4.  Pt will report improved pain by >/=50% Baseline:  Goal status: In progress   5.  Pt will have improved QuickDASH score to </=42.3 to demo MCID Baseline: 52.3 Goal status:   In progress     PLAN:  PT FREQUENCY: 2x/week  PT DURATION: 8 weeks  PLANNED INTERVENTIONS: [id number]- PT Re-evaluation, 97750- Physical Performance Testing, 97110-Therapeutic  exercises, 97530- Therapeutic activity, [id number]- Neuromuscular re-education, 97535- Self Care, [id number]- Manual therapy, [id number]- Electrical stimulation (unattended), [phone]- Traction (mechanical), [id number]- Ionotophoresis 4mg /ml Dexamethasone , 79439 (1-2 muscles), [id number] (3+ muscles)- Dry Needling, Patient/Family education, Taping, Joint mobilization, Spinal mobilization, Cryotherapy, and Moist heat  PLAN FOR NEXT SESSION:   Standing hip abd, review clams , continue UE strength and postural strength.    Tinnie Don, PT, DPT 09/20/2024, 9:22 AM

## 2024-09-22 ENCOUNTER — Encounter: Admitting: Physical Therapy

## 2024-09-27 ENCOUNTER — Encounter: Payer: Self-pay | Admitting: Physical Therapy

## 2024-09-27 ENCOUNTER — Ambulatory Visit: Admitting: Physical Therapy

## 2024-09-27 DIAGNOSIS — M5459 Other low back pain: Secondary | ICD-10-CM | POA: Diagnosis not present

## 2024-09-27 DIAGNOSIS — M5412 Radiculopathy, cervical region: Secondary | ICD-10-CM

## 2024-09-27 NOTE — Therapy (Signed)
 OUTPATIENT PHYSICAL THERAPY CERVICAL TREATMENT   Patient Name: [name] [name] MRN: [id number] DOB:10-28-1946, 78 y.o., female Today's Date: 09/27/2024    END OF SESSION:  PT End of Session - 09/27/24 0931     Visit Number 13    Number of Visits 16    Date for Recertification  11/01/24    Authorization Type [employer] Medicare  14 v through 11/17    PT Start Time 0932    PT Stop Time 1015    PT Time Calculation (min) 43 min    Activity Tolerance Patient tolerated treatment well    Behavior During Therapy WFL for tasks assessed/performed                 Past Medical History:  Diagnosis Date   Abnormal Pap smear    Abnormal peripheral vision    RT EYE   Allergy    Anemia    Arthritis    back and neck   Breast cancer (HCC) 1997   right breast   Bruises easily    Cataracts, bilateral    Chronic back pain    Chronic neck pain    Complication of anesthesia    slow to wake up   Family history of adverse reaction to anesthesia    Dad takes a long time to wake up   Frequency of urination    GERD (gastroesophageal reflux disease)    Glaucoma    Goiter    High cholesterol    History of postoperative nausea and vomiting    Hypertension    Hypothyroidism    Neuromuscular disorder (HCC)    Numbness    hands / feet /arms / legs - followed by Dr. [place] - [place]   Osteoporosis    PONV (postoperative nausea and vomiting)    Post-menopausal    Prediabetes    Sexual dysfunction    Shortness of breath    with exertion and Wt gain   Urinary incontinence    Past Surgical History:  Procedure Laterality Date   BIOPSY THYROID      BREAST SURGERY Right    lymph node removed   CATARACT EXTRACTION, BILATERAL  2009,2010   COLONOSCOPY  02/15/2014   Dr.Stark   EYE SURGERY  08/2012   detached retina surg   LAPAROSCOPY  1978   BTL   lumpectomy  1997   Rt   neck fusion  2010   THYROIDECTOMY N/A 05/04/2013   Procedure:  TOTAL THYROIDECTOMY;  Surgeon: Krystal CHRISTELLA Spinner,  MD;  Location: [facility];  Service: General;  Laterality: N/A;   TOTAL HIP ARTHROPLASTY Right 01/02/2021   Procedure: TOTAL HIP ARTHROPLASTY ANTERIOR APPROACH;  Surgeon: Ernie Cough, MD;  Location: [facility];  Service: Orthopedics;  Laterality: Right;  70 mins   TUBAL LIGATION     Patient Active Problem List   Diagnosis Date Noted   S/P cervical spinal fusion 08/16/2024   Neck pain 08/16/2024   Impaired fasting glucose 03/09/2024   Postoperative hypothyroidism 03/09/2024   Essential hypertension 03/09/2024   Osteoporosis 03/09/2024   Senile osteoporosis 09/02/2023   Prediabetes 06/26/2022   Aortic atherosclerosis 12/26/2021   Polyp of colon 12/26/2021   Familial hypercholesterolemia 10/28/2021   Osteoarthritis of right hip 01/02/2021   Status post right hip replacement 01/02/2021   Primary open angle glaucoma (POAG) of right eye, mild stage 09/13/2018   Diplopia 11/09/2015   Pseudophakia of both eyes 11/09/2015   Right sided weakness 10/28/2013   Cervical dystonia  10/28/2013   Bilateral dry eyes 10/04/2013   S/P total thyroidectomy 08/03/2013   Hypothyroidism 08/03/2013   Other forms of retinal detachment(361.89) 01/18/2013   Urinary incontinence, mixed 01/18/2013   Constipation 01/18/2013   HTN (hypertension) 12/28/2012   History of breast cancer 09/16/2012   Right retinal detachment 09/16/2012   Degeneration of lumbar intervertebral disc 05/30/2012   Multiple thyroid  nodules 04/06/2012   Glaucoma 03/19/2012   Former smoker 03/19/2012   Chronic pain following surgery or procedure 03/19/2012   Mammographic calcification 03/19/2012   Anemia    Cataracts, bilateral    Dyslipidemia (high LDL; low HDL) 09/30/2011   Thyroiditis 09/25/2010   Neck pain, musculoskeletal 07/04/2010   History of detached retina repair 07/04/2010   Cervical cord compression with myelopathy (HCC) 09/28/2009   Family hx of colon cancer 08/15/2009   Chronic back pain 08/15/2009   S/P breast lumpectomy  08/15/2009   Malignant neoplasm of breast (HCC) 04/03/2009   Hyperlipidemia 04/03/2009   Arthritis 04/03/2009   Goiter 04/03/2009    PCP: Wendolyn Jenkins Jansky, MD  REFERRING PROVIDER: Ulis Bottcher, PA-C  REFERRING DIAG: Z98.1 (ICD-10-CM) - S/P cervical spinal fusion M54.2 (ICD-10-CM) - Neck pain  THERAPY DIAG:  Radiculopathy, cervical region  Other low back pain  Rationale for Evaluation and Treatment: Rehabilitation  ONSET DATE: Flare up ~1-2 weeks ago  SUBJECTIVE:                                                                                                                                                                                                         SUBJECTIVE STATEMENT: Pt with no new complaints.   Pt has appt with neurosurgeon on  9/22.   Overall, neck/shoulders bothering her, feels neck is a bit better than at previous visit. She has a hard time standing, cooking, meal prep due to pain. She also has pain in back and weakness in legs that she feels is limiting for standing activity. She feels arms are very weak, and R hip also weak from previous THA.     Pt states she has a rod in her neck in 2010. Reports it's been hurting a lot lately. Pt states she was in a lot of pain -- was doing more than usual at the time. Pt is concerned about her diagnosis with osteoporosis. Had the operation when she was in [place]  and didn't get a lot of follow up. Not sure what she is allowed to do. Was told not to reach up high or hyperextend but not sure if it's for the rest of  her life or not. Does have chronic N/T in bilat hands but more on the right. Reports it may be from a disc issue. Does have pain across her back and notes weakness. Has been having more frequent headaches.   Hand dominance: Right  PERTINENT HISTORY:  Osteoporosis, ACDF 2010  PAIN:  Are you having pain? Yes: NPRS scale: 3 currently, at worst 6 Pain location: Back of neck and top of shoulders Pain description:  Numbness, constant irritating nagging pain Aggravating factors: doing too much, reaching Relieving factors: new supplements, rest  PRECAUTIONS: None  RED FLAGS: None     WEIGHT BEARING RESTRICTIONS: No  FALLS:  Has patient fallen in last 6 months? No  LIVING ENVIRONMENT: Lives with: lives alone Lives in: House/apartment Stairs: No Has following equipment at home: None  OCCUPATION: Retired  PLOF: Independent  PATIENT GOALS: Improve neck pain and reaching  NEXT MD VISIT: 08/11/24 neurosurgeon  OBJECTIVE:  Updated 10/13  DIAGNOSTIC FINDINGS:  06/16/24 Cervical x-ray IMPRESSION: 1. Anterior fusion plate at R4-R3 without evidence for hardware loosening or acute fracture. 2. Mild degenerative endplate changes at C4-C5 and C6-C7.  MRI 06/29/24 results pending  PATIENT SURVEYS:  QuickDASH Score:   Eval: 52.3 / 100 = 52.3 % QuickDASH Score:   10/15:    43%    COGNITION: Overall cognitive status: Within functional limits for tasks assessed  SENSATION: Chronic N/T in bilat hands  POSTURE: rounded shoulders, forward head, and decreased thoracic kyphosis  PALPATION:    CERVICAL ROM:   Active ROM A/PROM (deg) eval 07/28/24  10/13 neck  Flexion 26  Wfl   Extension 15  25 no pain   Right lateral flexion 20    Left lateral flexion 15    Right rotation 22 55 60  Left rotation 37 65 68   (Blank rows = not tested)  UPPER EXTREMITY MMT (within available range)  MMT: Right eval Left eval Right  10/13 Left 10/13  Shoulder flexion 3 pulls in neck 3+ pulls in neck 4 4  Shoulder extension      Shoulder abduction 3 3+ 4- 4-  Shoulder adduction      Shoulder extension 4 4    Shoulder internal rotation 3+ 4    Shoulder external rotation 3+ 4    Elbow flexion      Elbow extension      Wrist flexion      Wrist extension      Wrist ulnar deviation      Wrist radial deviation      Wrist pronation      Wrist supination       (Blank rows = not tested)   UPPER  EXTREMITY ROM:  AROM Right eval Left eval Right 09/01/24 Left 09/01/24  Shoulder flexion 100 105 128 135  Shoulder extension seated 55 70    Shoulder abduction 110 110    Shoulder adduction      Shoulder extension prone      Shoulder internal rotation T12 T10    Shoulder external rotation 70 70    Middle trapezius      Lower trapezius      Elbow flexion      Elbow extension      Wrist flexion      Wrist extension      Wrist ulnar deviation      Wrist radial deviation      Wrist pronation      Wrist supination  Grip strength       (Blank rows = not tested)  CERVICAL SPECIAL TESTS:    FUNCTIONAL TESTS:    TREATMENT DATE:          09/27/24:  Therapeutic Exercise: Aerobic: Supine:    SLR  4  x 5 on R ,    2 x 10 on L ;  clams GTB x 20;   Supine march GTB x 15 alternating with TA;  Hip ER  fallouts x 15;    LTR x 15  Seated:     S/L:   Standing:    Stretches:    Neuromuscular Re-education: Manual Therapy:    Therapeutic Activity:   Marching for hip flexion 2 x 15;   Hip abd 3 x 10 bil;  Step ups fwd- 6 in x 10 bil; , Lateral 4 in x 10 on R.  sit to stand x 10   Self Care:    09/13/24: Therapeutic Exercise: Aerobic: Supine:   bridging x 10 (difficult to get  proper mechanics)  SLR  2  x 5 on R , 2 x 10 on L  ;  clams GTB x 20;  Supine march GTB x 15 alternating with TA;  Hip ER  fallouts x 15;   LTR x 10  Seated:   sit to stand x 10    S/L:   Standing:    Stretches:   SKTC 30 sec x 3;   Neuromuscular Re-education: Manual Therapy:    Therapeutic Activity:   Marching for hip flexion x 15;   Hip abd 2 x 10 bil;  Step ups 6 in x10 bil;  Rows GTB x 15 Shoulder elevation x 10 Education on posture for kitchen counter  Self Care:   09/06/24 Therapeutic Exercise: Aerobic: Supine:    SLR  2  x 5 on R ;   Seated:  bicep curls 3 lb  2 x 10   S/L:  clams x 15 on R;   hip abd 2 x10  on R  Standing:   Scap squeeze x 12;  Rows BlueTB x 15;  Hip abd 2 x 10  bil; Stretches:   SKTC 30 sec x 3;   Neuromuscular Re-education: Manual Therapy:   Therapeutic Activity:  progression for reaching  shoulder flexion aarom x 10  Shoulder elevation/arom x 10; shoulder abd to 90 deg - arom 2 x 8  Wall push ups x 12 ;  Self Care:   09/01/24           Therapeutic Exercise: Aerobic: Supine:  Supine march with TA (for hip and core strength)  x 10;    SLR(knee bent) 2 x 5;   bridging x 10- cueing for form. - very  challenging  Seated: bicep curls 3 lb  2 x 8 (sitting )  S/L:  Standing:   Rows GTB x 15;  Shoulder elevation/arom x 10 Wall push ups 2 x 8  Stretches:   Neuromuscular Re-education: Manual Therapy:   Therapeutic Activity: Self Care:   PATIENT EDUCATION:  Education details: updated and reviewed HEP Person educated: Patient Education method: Explanation, Demonstration, and Handouts Education comprehension: verbalized understanding, returned demonstration, and needs further education  HOME EXERCISE PROGRAM: Access Code: [email or web address]   ASSESSMENT:  CLINICAL IMPRESSION:  Continued strengthening focus for R hip/LE. Pt continues to be challenged with this, but is able to perform higher level activity. She has instability and more difficulty with sls phase/stability with marching and hip  abd. Updated HEP- with recommendations to continue standing hip strength and SLR for improving strength.    Eval: Pt making progress with pain in neck and UE strength. Seh continues to be challenged with UE strength and ROM, but is improving. She will benefit from continued work on pain free shoulder elevation and functional strength of UEs without pain. She reports decreased pain levels, but still with functional deficits with quick dash and reports still having difficulty at home with heavier activities due to weakness, fatigue and pain.    Eval: Patient is a 77 y.o. F who was seen today for physical therapy evaluation and treatment for neck and shoulder pain.  PMH significant for osteoporosis and history of ACDF C5-6 with chronic history of bilat UE N/T. Assessment is significant for hypomobile C-spine with multiple trigger points throughout her cervical muscles, bilat shoulder and postural weakness (R weaker than L), and postural abnormalities causing issues with reaching and pain. Pt will benefit from PT to improve on these deficits.   OBJECTIVE IMPAIRMENTS: decreased activity tolerance, decreased coordination, decreased endurance, decreased mobility, decreased ROM, decreased strength, hypomobility, increased fascial restrictions, increased muscle spasms, impaired flexibility, impaired sensation, impaired UE functional use, postural dysfunction, and pain.   ACTIVITY LIMITATIONS: carrying, lifting, bathing, toileting, dressing, self feeding, reach over head, and hygiene/grooming  PARTICIPATION LIMITATIONS: meal prep, cleaning, laundry, driving, and community activity  PERSONAL FACTORS: Age, Fitness, Past/current experiences, and Time since onset of injury/illness/exacerbation are also affecting patient's functional outcome.   REHAB POTENTIAL: Good  CLINICAL DECISION MAKING: Evolving/moderate complexity  EVALUATION COMPLEXITY: Moderate   GOALS: Goals reviewed with patient? Yes  SHORT TERM GOALS: Target date: 07/30/2024   Pt will be ind with initial HEP Baseline:  Goal status: MET  2.  Pt will improve shoulder AROM by at least 10 deg for flexion and abd for overhead reaching Baseline:  Goal status: MET  3.  Pt will report decrease in pain by >/=25% Baseline:  Goal status: MET   LONG TERM GOALS: Target date: 11/01/2024   Pt will be ind with management and progression of HEP Baseline:  Goal status: In progress   2.  Pt will demo improved cervical rotation to at least 60 deg for driving tasks Baseline: 22 deg on L, 37 deg on R Goal status: MET  3.  Pt will improve shoulder flexion and abd AROM to >/=140 deg for reaching into kitchen  cabinets Baseline: see ROM chart above Goal status: In progress   4.  Pt will report improved pain by >/=50% Baseline:  Goal status: In progress   5.  Pt will have improved QuickDASH score to </=42.3 to demo MCID Baseline: 52.3 Goal status:   In progress     PLAN:  PT FREQUENCY: 2x/week  PT DURATION: 8 weeks  PLANNED INTERVENTIONS: [id number]- PT Re-evaluation, 97750- Physical Performance Testing, 97110-Therapeutic exercises, 97530- Therapeutic activity, [id number]- Neuromuscular re-education, 97535- Self Care, [id number]- Manual therapy, [id number]- Electrical stimulation (unattended), [phone]- Traction (mechanical), [id number]- Ionotophoresis 4mg /ml Dexamethasone , 79439 (1-2 muscles), [id number] (3+ muscles)- Dry Needling, Patient/Family education, Taping, Joint mobilization, Spinal mobilization, Cryotherapy, and Moist heat  PLAN FOR NEXT SESSION:   Standing hip abd, review clams , continue UE strength and postural strength.    Tinnie Don, PT, DPT 09/27/2024, 9:31 AM

## 2024-09-29 ENCOUNTER — Encounter: Admitting: Physical Therapy

## 2024-10-04 ENCOUNTER — Ambulatory Visit (INDEPENDENT_AMBULATORY_CARE_PROVIDER_SITE_OTHER): Admitting: Physical Therapy

## 2024-10-04 DIAGNOSIS — M5412 Radiculopathy, cervical region: Secondary | ICD-10-CM

## 2024-10-04 DIAGNOSIS — M5459 Other low back pain: Secondary | ICD-10-CM | POA: Diagnosis not present

## 2024-10-04 DIAGNOSIS — R2689 Other abnormalities of gait and mobility: Secondary | ICD-10-CM

## 2024-10-04 NOTE — Therapy (Unsigned)
 OUTPATIENT PHYSICAL THERAPY CERVICAL TREATMENT   Patient Name: [name] [name] MRN: [id number] DOB:02-16-1946, 78 y.o., female Today's Date: 10/04/2024    END OF SESSION:           Past Medical History:  Diagnosis Date   Abnormal Pap smear    Abnormal peripheral vision    RT EYE   Allergy    Anemia    Arthritis    back and neck   Breast cancer (HCC) 1997   right breast   Bruises easily    Cataracts, bilateral    Chronic back pain    Chronic neck pain    Complication of anesthesia    slow to wake up   Family history of adverse reaction to anesthesia    Dad takes a long time to wake up   Frequency of urination    GERD (gastroesophageal reflux disease)    Glaucoma    Goiter    High cholesterol    History of postoperative nausea and vomiting    Hypertension    Hypothyroidism    Neuromuscular disorder (HCC)    Numbness    hands / feet /arms / legs - followed by Dr. [place] - [place]   Osteoporosis    PONV (postoperative nausea and vomiting)    Post-menopausal    Prediabetes    Sexual dysfunction    Shortness of breath    with exertion and Wt gain   Urinary incontinence    Past Surgical History:  Procedure Laterality Date   BIOPSY THYROID      BREAST SURGERY Right    lymph node removed   CATARACT EXTRACTION, BILATERAL  2009,2010   COLONOSCOPY  02/15/2014   Dr.Stark   EYE SURGERY  08/2012   detached retina surg   LAPAROSCOPY  1978   BTL   lumpectomy  1997   Rt   neck fusion  2010   THYROIDECTOMY N/A 05/04/2013   Procedure:  TOTAL THYROIDECTOMY;  Surgeon: Krystal CHRISTELLA Spinner, MD;  Location: [facility];  Service: General;  Laterality: N/A;   TOTAL HIP ARTHROPLASTY Right 01/02/2021   Procedure: TOTAL HIP ARTHROPLASTY ANTERIOR APPROACH;  Surgeon: Ernie Cough, MD;  Location: [facility];  Service: Orthopedics;  Laterality: Right;  70 mins   TUBAL LIGATION     Patient Active Problem List   Diagnosis Date Noted   S/P cervical spinal fusion 08/16/2024    Neck pain 08/16/2024   Impaired fasting glucose 03/09/2024   Postoperative hypothyroidism 03/09/2024   Essential hypertension 03/09/2024   Osteoporosis 03/09/2024   Senile osteoporosis 09/02/2023   Prediabetes 06/26/2022   Aortic atherosclerosis 12/26/2021   Polyp of colon 12/26/2021   Familial hypercholesterolemia 10/28/2021   Osteoarthritis of right hip 01/02/2021   Status post right hip replacement 01/02/2021   Primary open angle glaucoma (POAG) of right eye, mild stage 09/13/2018   Diplopia 11/09/2015   Pseudophakia of both eyes 11/09/2015   Right sided weakness 10/28/2013   Cervical dystonia 10/28/2013   Bilateral dry eyes 10/04/2013   S/P total thyroidectomy 08/03/2013   Hypothyroidism 08/03/2013   Other forms of retinal detachment(361.89) 01/18/2013   Urinary incontinence, mixed 01/18/2013   Constipation 01/18/2013   HTN (hypertension) 12/28/2012   History of breast cancer 09/16/2012   Right retinal detachment 09/16/2012   Degeneration of lumbar intervertebral disc 05/30/2012   Multiple thyroid  nodules 04/06/2012   Glaucoma 03/19/2012   Former smoker 03/19/2012   Chronic pain following surgery or procedure 03/19/2012   Mammographic calcification 03/19/2012  Anemia    Cataracts, bilateral    Dyslipidemia (high LDL; low HDL) 09/30/2011   Thyroiditis 09/25/2010   Neck pain, musculoskeletal 07/04/2010   History of detached retina repair 07/04/2010   Cervical cord compression with myelopathy (HCC) 09/28/2009   Family hx of colon cancer 08/15/2009   Chronic back pain 08/15/2009   S/P breast lumpectomy 08/15/2009   Malignant neoplasm of breast (HCC) 04/03/2009   Hyperlipidemia 04/03/2009   Arthritis 04/03/2009   Goiter 04/03/2009    PCP: Wendolyn Jenkins Jansky, MD  REFERRING PROVIDER: Ulis Bottcher, PA-C  REFERRING DIAG: Z98.1 (ICD-10-CM) - S/P cervical spinal fusion M54.2 (ICD-10-CM) - Neck pain  THERAPY DIAG:  Radiculopathy, cervical region  Other low back  pain  Other abnormalities of gait and mobility  Rationale for Evaluation and Treatment: Rehabilitation  ONSET DATE: Flare up ~1-2 weeks ago  SUBJECTIVE:                                                                                                                                                                                                         SUBJECTIVE STATEMENT: Pt with no new complaints.   Pt has appt with neurosurgeon on  9/22.   Overall, neck/shoulders bothering her, feels neck is a bit better than at previous visit. She has a hard time standing, cooking, meal prep due to pain. She also has pain in back and weakness in legs that she feels is limiting for standing activity. She feels arms are very weak, and R hip also weak from previous THA.     Pt states she has a rod in her neck in 2010. Reports it's been hurting a lot lately. Pt states she was in a lot of pain -- was doing more than usual at the time. Pt is concerned about her diagnosis with osteoporosis. Had the operation when she was in [place]  and didn't get a lot of follow up. Not sure what she is allowed to do. Was told not to reach up high or hyperextend but not sure if it's for the rest of her life or not. Does have chronic N/T in bilat hands but more on the right. Reports it may be from a disc issue. Does have pain across her back and notes weakness. Has been having more frequent headaches.   Hand dominance: Right  PERTINENT HISTORY:  Osteoporosis, ACDF 2010  PAIN:  Are you having pain? Yes: NPRS scale: 3 currently, at worst 6 Pain location: Back of neck and top of shoulders Pain description: Numbness, constant irritating nagging pain Aggravating factors: doing  too much, reaching Relieving factors: new supplements, rest  PRECAUTIONS: None  RED FLAGS: None     WEIGHT BEARING RESTRICTIONS: No  FALLS:  Has patient fallen in last 6 months? No  LIVING ENVIRONMENT: Lives with: lives alone Lives in:  House/apartment Stairs: No Has following equipment at home: None  OCCUPATION: Retired  PLOF: Independent  PATIENT GOALS: Improve neck pain and reaching  NEXT MD VISIT: 08/11/24 neurosurgeon  OBJECTIVE:  Updated 10/13  DIAGNOSTIC FINDINGS:  06/16/24 Cervical x-ray IMPRESSION: 1. Anterior fusion plate at R4-R3 without evidence for hardware loosening or acute fracture. 2. Mild degenerative endplate changes at C4-C5 and C6-C7.  MRI 06/29/24 results pending  PATIENT SURVEYS:  QuickDASH Score:   Eval: 52.3 / 100 = 52.3 % QuickDASH Score:   10/15:    43%    COGNITION: Overall cognitive status: Within functional limits for tasks assessed  SENSATION: Chronic N/T in bilat hands  POSTURE: rounded shoulders, forward head, and decreased thoracic kyphosis  PALPATION:    CERVICAL ROM:   Active ROM A/PROM (deg) eval 07/28/24  10/13 neck  Flexion 26  Wfl   Extension 15  25 no pain   Right lateral flexion 20    Left lateral flexion 15    Right rotation 22 55 60  Left rotation 37 65 68   (Blank rows = not tested)  UPPER EXTREMITY MMT (within available range)  MMT: Right eval Left eval Right  10/13 Left 10/13  Shoulder flexion 3 pulls in neck 3+ pulls in neck 4 4  Shoulder extension      Shoulder abduction 3 3+ 4- 4-  Shoulder adduction      Shoulder extension 4 4    Shoulder internal rotation 3+ 4    Shoulder external rotation 3+ 4    Elbow flexion      Elbow extension      Wrist flexion      Wrist extension      Wrist ulnar deviation      Wrist radial deviation      Wrist pronation      Wrist supination       (Blank rows = not tested)   UPPER EXTREMITY ROM:  AROM Right eval Left eval Right 09/01/24 Left 09/01/24  Shoulder flexion 100 105 128 135  Shoulder extension seated 55 70    Shoulder abduction 110 110    Shoulder adduction      Shoulder extension prone      Shoulder internal rotation T12 T10    Shoulder external rotation 70 70    Middle  trapezius      Lower trapezius      Elbow flexion      Elbow extension      Wrist flexion      Wrist extension      Wrist ulnar deviation      Wrist radial deviation      Wrist pronation      Wrist supination      Grip strength       (Blank rows = not tested)  CERVICAL SPECIAL TESTS:    FUNCTIONAL TESTS:    TREATMENT DATE:   10/04/24 Therapeutic Exercise: Aerobic: Supine:    SLR  x 10 then x5 on R;  A;    LTR x 15  Seated:     S/L:  clams 2 x 10 bil;  Standing:     Stretches:    Neuromuscular Re-education: Manual Therapy:    Therapeutic Activity:  Marching for hip flexion 2 x 15;   Hip abd 2  x 10 bil;  Step ups fwd- 6 in x 10 bil; , Lateral 4 in x 10 on R.  sit to stand x 10   Self Care:          09/27/24:  Therapeutic Exercise: Aerobic: Supine:    SLR  4  x 5 on R ,    2 x 10 on L ;  clams GTB x 20;   Supine march GTB x 15 alternating with TA;  Hip ER  fallouts x 15;    LTR x 15  Seated:     S/L:   Standing:    Stretches:    Neuromuscular Re-education: Manual Therapy:    Therapeutic Activity:   Marching for hip flexion 2 x 15;   Hip abd 3 x 10 bil;  Step ups fwd- 6 in x 10 bil; , Lateral 4 in x 10 on R.  sit to stand x 10   Self Care:    09/13/24: Therapeutic Exercise: Aerobic: Supine:   bridging x 10 (difficult to get  proper mechanics)  SLR  2  x 5 on R , 2 x 10 on L  ;  clams GTB x 20;  Supine march GTB x 15 alternating with TA;  Hip ER  fallouts x 15;   LTR x 10  Seated:   sit to stand x 10    S/L:   Standing:    Stretches:   SKTC 30 sec x 3;   Neuromuscular Re-education: Manual Therapy:    Therapeutic Activity:   Marching for hip flexion x 15;   Hip abd 2 x 10 bil;  Step ups 6 in x10 bil;  Rows GTB x 15 Shoulder elevation x 10 Education on posture for kitchen counter  Self Care:   PATIENT EDUCATION:  Education details: updated and reviewed HEP Person educated: Patient Education method: Explanation, Demonstration, and  Handouts Education comprehension: verbalized understanding, returned demonstration, and needs further education  HOME EXERCISE PROGRAM: Access Code: [email or web address]   ASSESSMENT:  CLINICAL IMPRESSION:  Continued strengthening focus for R hip/LE. Pt continues to be challenged with this, but is able to perform higher level activity. She has instability and more difficulty with sls phase/stability with marching and hip abd. Updated HEP- with recommendations to continue standing hip strength and SLR for improving strength.    Eval: Pt making progress with pain in neck and UE strength. Seh continues to be challenged with UE strength and ROM, but is improving. She will benefit from continued work on pain free shoulder elevation and functional strength of UEs without pain. She reports decreased pain levels, but still with functional deficits with quick dash and reports still having difficulty at home with heavier activities due to weakness, fatigue and pain.    Eval: Patient is a 78 y.o. F who was seen today for physical therapy evaluation and treatment for neck and shoulder pain. PMH significant for osteoporosis and history of ACDF C5-6 with chronic history of bilat UE N/T. Assessment is significant for hypomobile C-spine with multiple trigger points throughout her cervical muscles, bilat shoulder and postural weakness (R weaker than L), and postural abnormalities causing issues with reaching and pain. Pt will benefit from PT to improve on these deficits.   OBJECTIVE IMPAIRMENTS: decreased activity tolerance, decreased coordination, decreased endurance, decreased mobility, decreased ROM, decreased strength, hypomobility, increased fascial restrictions, increased muscle spasms, impaired flexibility, impaired sensation, impaired UE functional  use, postural dysfunction, and pain.   ACTIVITY LIMITATIONS: carrying, lifting, bathing, toileting, dressing, self feeding, reach over head, and  hygiene/grooming  PARTICIPATION LIMITATIONS: meal prep, cleaning, laundry, driving, and community activity  PERSONAL FACTORS: Age, Fitness, Past/current experiences, and Time since onset of injury/illness/exacerbation are also affecting patient's functional outcome.   REHAB POTENTIAL: Good  CLINICAL DECISION MAKING: Evolving/moderate complexity  EVALUATION COMPLEXITY: Moderate   GOALS: Goals reviewed with patient? Yes  SHORT TERM GOALS: Target date: 07/30/2024   Pt will be ind with initial HEP Baseline:  Goal status: MET  2.  Pt will improve shoulder AROM by at least 10 deg for flexion and abd for overhead reaching Baseline:  Goal status: MET  3.  Pt will report decrease in pain by >/=25% Baseline:  Goal status: MET   LONG TERM GOALS: Target date: 11/01/2024   Pt will be ind with management and progression of HEP Baseline:  Goal status: In progress   2.  Pt will demo improved cervical rotation to at least 60 deg for driving tasks Baseline: 22 deg on L, 37 deg on R Goal status: MET  3.  Pt will improve shoulder flexion and abd AROM to >/=140 deg for reaching into kitchen cabinets Baseline: see ROM chart above Goal status: In progress   4.  Pt will report improved pain by >/=50% Baseline:  Goal status: In progress   5.  Pt will have improved QuickDASH score to </=42.3 to demo MCID Baseline: 52.3 Goal status:   In progress     PLAN:  PT FREQUENCY: 2x/week  PT DURATION: 8 weeks  PLANNED INTERVENTIONS: 97164- PT Re-evaluation, 97750- Physical Performance Testing, 97110-Therapeutic exercises, 97530- Therapeutic activity, [id number]- Neuromuscular re-education, 97535- Self Care, [id number]- Manual therapy, [id number]- Electrical stimulation (unattended), [phone]- Traction (mechanical), [id number]- Ionotophoresis 4mg /ml Dexamethasone , 79439 (1-2 muscles), [id number] (3+ muscles)- Dry Needling, Patient/Family education, Taping, Joint mobilization, Spinal mobilization, Cryotherapy, and Moist  heat  PLAN FOR NEXT SESSION:   Standing hip abd, review clams , continue UE strength and postural strength.    Tinnie Don, PT, DPT 10/04/2024, 9:32 AM

## 2024-10-05 ENCOUNTER — Encounter: Payer: Self-pay | Admitting: Physical Therapy

## 2024-11-01 ENCOUNTER — Ambulatory Visit

## 2024-11-01 VITALS — Ht 63.0 in | Wt 158.0 lb

## 2024-11-01 DIAGNOSIS — Z0000 Encounter for general adult medical examination without abnormal findings: Secondary | ICD-10-CM

## 2024-11-01 NOTE — Progress Notes (Signed)
 Chief Complaint  Patient presents with   Medicare Wellness     Subjective:   Francys Bolin is a 78 y.o. female who presents for a Medicare Annual Wellness Visit.  Visit info / Clinical Intake: Medicare Wellness Visit Type:: Initial Annual Wellness Visit Persons participating in visit and providing information:: patient Medicare Wellness Visit Mode:: Telephone If telephone:: video declined Since this visit was completed virtually, some vitals may be partially provided or unavailable. Missing vitals are due to the limitations of the virtual format.: Unable to obtain vitals - no equipment If Telephone or Video please confirm:: I connected with patient using audio/video enable telemedicine. I verified patient identity with two identifiers, discussed telehealth limitations, and patient agreed to proceed. Patient Location:: home Provider Location:: office Interpreter Needed?: No Pre-visit prep was completed: yes AWV questionnaire completed by patient prior to visit?: yes Date:: 10/31/24 Living arrangements:: (!) (Patient-Rptd) lives alone Patient's Overall Health Status Rating: (!) (Patient-Rptd) fair Typical amount of pain: (!) (Patient-Rptd) a lot Does pain affect daily life?: (!) (Patient-Rptd) yes Are you currently prescribed opioids?: no  Dietary Habits and Nutritional Risks How many meals a day?: (Patient-Rptd) 3 Eats fruit and vegetables daily?: (Patient-Rptd) yes Most meals are obtained by: (Patient-Rptd) preparing own meals; eating out In the last 2 weeks, have you had any of the following?: (!) nausea, vomiting, diarrhea Diabetic:: no  Functional Status Activities of Daily Living (to include ambulation/medication): (Patient-Rptd) Independent Ambulation: Independent with device- listed below Home Assistive Devices/Equipment: Eyeglasses Medication Administration: (Patient-Rptd) Independent Home Management (perform basic housework or laundry): (Patient-Rptd) Needs  assistance (comment) Manage your own finances?: (Patient-Rptd) yes Primary transportation is: (Patient-Rptd) driving Concerns about vision?: no *vision screening is required for WTM* Concerns about hearing?: (!) yes (maybe slight HOH) Uses hearing aids?: no Hear whispered voice?: yes  Fall Screening Falls in the past year?: (Patient-Rptd) 0 Number of falls in past year: 0 Was there an injury with Fall?: 0 Fall Risk Category Calculator: 0 Patient Fall Risk Level: Low Fall Risk  Fall Risk Patient at Risk for Falls Due to: Impaired mobility Fall risk Follow up: Falls evaluation completed  Home and Transportation Safety: All rugs have non-skid backing?: (!) (Patient-Rptd) no All stairs or steps have railings?: (Patient-Rptd) N/A, no stairs Grab bars in the bathtub or shower?: (Patient-Rptd) yes Have non-skid surface in bathtub or shower?: (Patient-Rptd) yes Good home lighting?: (Patient-Rptd) yes Regular seat belt use?: (Patient-Rptd) yes Hospital stays in the last year:: (Patient-Rptd) no  Cognitive Assessment Difficulty concentrating, remembering, or making decisions? : (Patient-Rptd) yes Will 6CIT or Mini Cog be Completed: yes What year is it?: 0 points What month is it?: 0 points Give patient an address phrase to remember (5 components): [address] [place]  About what time is it?: 0 points Count backwards from 20 to 1: 0 points Say the months of the year in reverse: 0 points Repeat the address phrase from earlier: 0 points 6 CIT Score: 0 points  Advance Directives (For Healthcare) Does Patient Have a Medical Advance Directive?: Yes Does patient want to make changes to medical advance directive?: No - Patient declined Type of Advance Directive: Healthcare Power of [place]; Living will  Reviewed/Updated  Reviewed/Updated: Reviewed All (Medical, Surgical, Family, Medications, Allergies, Care Teams, Patient Goals)    Allergies (verified) Denosumab, Erythromycin,  Ezetimibe, Latex, Other, and Statins   Current Medications (verified) Outpatient Encounter Medications as of 11/01/2024  Medication Sig   amLODipine  (NORVASC ) 5 MG tablet Take 1 tablet (5  mg total) by mouth daily.   Biotin w/ Vitamins C & E (HAIR/SKIN/NAILS PO) Take by mouth.   Calcium -Magnesium -Zinc-Vit D3 (CALCIUM -MAGNESIUM -ZINC-D3 PO) Take by mouth.   dorzolamide  (TRUSOPT ) 2 % ophthalmic solution 1 drop into affected eye Ophthalmic twice a day   dorzolamide -timolol  (COSOPT ) 22.3-6.8 MG/ML ophthalmic solution Place 1 drop into both eyes 2 (two) times daily.    Ferrous Gluconate (IRON 27 PO) Take by mouth.   gabapentin  (NEURONTIN ) 300 MG capsule Take 1 capsule (300 mg total) by mouth 3 (three) times daily.   glucosamine-chondroitin 500-400 MG tablet Take 1 tablet by mouth 3 (three) times daily.   irbesartan  (AVAPRO ) 300 MG tablet TAKE 1 TABLET BY MOUTH DAILY   levothyroxine  (SYNTHROID ) 75 MCG tablet TAKE 1 TABLET BY MOUTH 5 DAYS A  WEEK AND 1 AND 1/2 TABLETS 2  DAYS A WEEK   Multiple Vitamin (MULTIVITAMIN ADULT) TABS Take 1 tablet by mouth daily.   polyvinyl alcohol (LIQUIFILM TEARS) 1.4 % ophthalmic solution Place 1 drop into both eyes daily as needed (for dry eyes).   rosuvastatin  (CRESTOR ) 5 MG tablet TAKE 1 TABLET BY MOUTH TWICE  WEEKLY   VITAMIN D  PO Take by mouth.   latanoprost  (XALATAN ) 0.005 % ophthalmic solution Place 1 drop into the left eye at bedtime.   No facility-administered encounter medications on file as of 11/01/2024.    History: Past Medical History:  Diagnosis Date   Abnormal Pap smear    Abnormal peripheral vision    RT EYE   Allergy    Anemia    Arthritis    back and neck   Breast cancer (HCC) 1997   right breast   Bruises easily    Cataracts, bilateral    Chronic back pain    Chronic neck pain    Complication of anesthesia    slow to wake up   Family history of adverse reaction to anesthesia    Dad takes a long time to wake up   Frequency of  urination    GERD (gastroesophageal reflux disease)    Glaucoma    Goiter    High cholesterol    History of postoperative nausea and vomiting    Hypertension    Hypothyroidism    Neuromuscular disorder (HCC)    Numbness    hands / feet /arms / legs - followed by Dr. [place] - [place]   Osteoporosis    PONV (postoperative nausea and vomiting)    Post-menopausal    Prediabetes    Sexual dysfunction    Shortness of breath    with exertion and Wt gain   Urinary incontinence    Past Surgical History:  Procedure Laterality Date   BIOPSY THYROID      BREAST SURGERY Right    lymph node removed   CATARACT EXTRACTION, BILATERAL  2009,2010   COLONOSCOPY  02/15/2014   Dr.Stark   EYE SURGERY  08/2012   detached retina surg   LAPAROSCOPY  1978   BTL   lumpectomy  1997   Rt   neck fusion  2010   THYROIDECTOMY N/A 05/04/2013   Procedure:  TOTAL THYROIDECTOMY;  Surgeon: Krystal CHRISTELLA Spinner, MD;  Location: [facility];  Service: General;  Laterality: N/A;   TOTAL HIP ARTHROPLASTY Right 01/02/2021   Procedure: TOTAL HIP ARTHROPLASTY ANTERIOR APPROACH;  Surgeon: Ernie Cough, MD;  Location: [facility];  Service: Orthopedics;  Laterality: Right;  70 mins   TUBAL LIGATION     Family History  Problem Relation  Age of Onset   Colon polyps Mother    Hyperlipidemia Mother    Heart disease Mother    Diabetes Mother    Hypertension Mother    Hyperlipidemia Father    Colon cancer Father 2   Hearing loss Sister    Hyperlipidemia Sister    Heart disease Sister    Heart disease Sister    Hyperlipidemia Sister    Stroke Sister    Hypertension Sister    Thyroid  disease Sister    Hyperlipidemia Brother    Drug abuse Brother    Early death Brother    Esophageal cancer Brother 78       smoker   Diabetes Brother    Heart disease Brother    Hypertension Brother    Heart attack Brother    Early death Brother    Colon cancer Paternal Aunt 84   Heart disease Maternal Grandmother    Early death  Maternal Grandmother    Diabetes Maternal Grandmother    Asthma Maternal Grandmother    Diabetes Paternal Grandmother    Hypertension Paternal Grandmother    Heart disease Paternal Grandmother    Breast cancer Paternal Grandmother        dx in her 59s   Heart disease Paternal Grandfather    Depression Daughter    Cervical cancer Daughter        dx in her 61s   Celiac disease Daughter    Rectal cancer Son    Prostate cancer Son    Thyroid  disease Other 40       niece   Social History   Occupational History   Occupation: retired  Tobacco Use   Smoking status: Former    Current packs/day: 0.00    Average packs/day: 0.5 packs/day for 3.0 years (1.5 ttl pk-yrs)    Types: Cigarettes    Start date: 01/24/1966    Quit date: 04/27/1968    Years since quitting: 56.5   Smokeless tobacco: Never   Tobacco comments:    quit over 40 years ago  Vaping Use   Vaping status: Never Used  Substance and Sexual Activity   Alcohol use: Yes    Comment: 1 every other month   Drug use: No   Sexual activity: Not Currently   Tobacco Counseling Counseling given: Not Answered Tobacco comments: quit over 40 years ago  SDOH Screenings   Food Insecurity: No Food Insecurity (10/31/2024)  Housing: Low Risk  (10/31/2024)  Transportation Needs: No Transportation Needs (10/31/2024)  Utilities: Not At Risk (11/01/2024)  Alcohol Screen: Low Risk  (10/31/2024)  Depression (PHQ2-9): Low Risk  (11/01/2024)  Financial Resource Strain: Low Risk  (10/31/2024)  Physical Activity: Sufficiently Active (10/31/2024)  Social Connections: Moderately Integrated (10/31/2024)  Stress: No Stress Concern Present (10/31/2024)  Tobacco Use: Medium Risk (11/01/2024)  Health Literacy: Adequate Health Literacy (11/01/2024)   See flowsheets for full screening details  Depression Screen PHQ 2 & 9 Depression Scale- Over the past 2 weeks, how often have you been bothered by any of the following problems? Little interest or pleasure in  doing things: 0 Feeling down, depressed, or hopeless (PHQ Adolescent also includes...irritable): 1 (lost a family memeber /grief) PHQ-2 Total Score: 1     Goals Addressed               This Visit's Progress     keep moving and exercise (pt-stated)        Keep moving and exercise  Objective:    Today's Vitals   11/01/24 1445  Weight: 158 lb (71.7 kg)  Height: 5' 3 (1.6 m)   Body mass index is 27.99 kg/m.  Hearing/Vision screen Hearing Screening - Comments:: Pt stated slight HOH  Vision Screening - Comments:: Wears rx glasses - up to date with routine eye exams with Dr KANDICE  Immunizations and Health Maintenance Health Maintenance  Topic Date Due   COVID-19 Vaccine (1) Never done   Zoster Vaccines- Shingrix (1 of 2) Never done   Pneumococcal Vaccine: 50+ Years (1 of 1 - PCV) 12/23/2024 (Originally 03/04/1997)   Influenza Vaccine  02/22/2025 (Originally 06/25/2024)   Mammogram  04/02/2025   Colonoscopy  04/07/2025   Medicare Annual Wellness (AWV)  11/01/2025   Bone Density Scan  Completed   Meningococcal B Vaccine  Aged Out   DTaP/Tdap/Td  Discontinued   Hepatitis C Screening  Discontinued        Assessment/Plan:  This is a routine wellness examination for [place].  Patient Care Team: Wendolyn Jenkins Jansky, MD as PCP - General (Family Medicine) Croitoru, Jerel, MD as PCP - Cardiology (Cardiology)  I have personally reviewed and noted the following in the patient's chart:   Medical and social history Use of alcohol, tobacco or illicit drugs  Current medications and supplements including opioid prescriptions. Functional ability and status Nutritional status Physical activity Advanced directives List of other physicians Hospitalizations, surgeries, and ER visits in previous 12 months Vitals Screenings to include cognitive, depression, and falls Referrals and appointments  No orders of the defined types were placed in this encounter.  In addition,  I have reviewed and discussed with patient certain preventive protocols, quality metrics, and best practice recommendations. A written personalized care plan for preventive services as well as general preventive health recommendations were provided to patient.   Ellouise VEAR Haws, LPN   [id number]   Return in about 1 year (around 11/02/2025).  After Visit Summary: (MyChart) Due to this being a telephonic visit, the after visit summary with patients personalized plan was offered to patient via MyChart   Nurse Notes: none

## 2024-11-01 NOTE — Patient Instructions (Signed)
 [name] [name],  Thank you for taking the time for your Medicare Wellness Visit. I appreciate your continued commitment to your health goals. Please review the care plan we discussed, and feel free to reach out if I can assist you further.  Please note that Annual Wellness Visits do not include a physical exam. Some assessments may be limited, especially if the visit was conducted virtually. If needed, we may recommend an in-person follow-up with your provider.  Ongoing Care Seeing your primary care provider every 3 to 6 months helps us  monitor your health and provide consistent, personalized care.   Referrals If a referral was made during today's visit and you haven't received any updates within two weeks, please contact the referred provider directly to check on the status.  Recommended Screenings:  Health Maintenance  Topic Date Due   COVID-19 Vaccine (1) Never done   Zoster (Shingles) Vaccine (1 of 2) Never done   Flu Shot  Never done   Pneumococcal Vaccine for age over 60 (1 of 1 - PCV) 12/23/2024*   Breast Cancer Screening  04/02/2025   Colon Cancer Screening  04/07/2025   Medicare Annual Wellness Visit  11/01/2025   Osteoporosis screening with Bone Density Scan  Completed   Meningitis B Vaccine  Aged Out   DTaP/Tdap/Td vaccine  Discontinued   Hepatitis C Screening  Discontinued  *Topic was postponed. The date shown is not the original due date.       07/01/2024    3:24 PM  Advanced Directives  Does Patient Have a Medical Advance Directive? Yes  Type of Estate Agent of [place];Living will  Does patient want to make changes to medical advance directive? No - Patient declined    Vision: Annual vision screenings are recommended for early detection of glaucoma, cataracts, and diabetic retinopathy. These exams can also reveal signs of chronic conditions such as diabetes and high blood pressure.  Dental: Annual dental screenings help detect early signs of  oral cancer, gum disease, and other conditions linked to overall health, including heart disease and diabetes.  Please see the attached documents for additional preventive care recommendations.

## 2024-11-02 NOTE — Progress Notes (Signed)
 Chief Complaint  Patient presents with   Medicare Wellness     Subjective:   [name] [name] is a 78 y.o. female who presents for a Medicare Annual Wellness Visit.  Visit info / Clinical Intake: Medicare Wellness Visit Type:: Subsequent Annual Wellness Visit Persons participating in visit and providing information:: patient Medicare Wellness Visit Mode:: Telephone If telephone:: video declined Since this visit was completed virtually, some vitals may be partially provided or unavailable. Missing vitals are due to the limitations of the virtual format.: Unable to obtain vitals - no equipment If Telephone or Video please confirm:: I connected with patient using audio/video enable telemedicine. I verified patient identity with two identifiers, discussed telehealth limitations, and patient agreed to proceed. Patient Location:: home Provider Location:: office Interpreter Needed?: No Pre-visit prep was completed: yes AWV questionnaire completed by patient prior to visit?: yes Date:: 10/31/24 Living arrangements:: (!) (Patient-Rptd) lives alone Patient's Overall Health Status Rating: (!) (Patient-Rptd) fair Typical amount of pain: (!) (Patient-Rptd) a lot Does pain affect daily life?: (!) (Patient-Rptd) yes Are you currently prescribed opioids?: no  Dietary Habits and Nutritional Risks How many meals a day?: (Patient-Rptd) 3 Eats fruit and vegetables daily?: (Patient-Rptd) yes Most meals are obtained by: (Patient-Rptd) preparing own meals; eating out In the last 2 weeks, have you had any of the following?: (!) nausea, vomiting, diarrhea Diabetic:: no  Functional Status Activities of Daily Living (to include ambulation/medication): (Patient-Rptd) Independent Ambulation: Independent with device- listed below Home Assistive Devices/Equipment: Eyeglasses Medication Administration: (Patient-Rptd) Independent Home Management (perform basic housework or laundry): (Patient-Rptd) Needs  assistance (comment) Manage your own finances?: (Patient-Rptd) yes Primary transportation is: (Patient-Rptd) driving Concerns about vision?: no *vision screening is required for WTM* Concerns about hearing?: (!) yes (maybe slight HOH) Uses hearing aids?: no Hear whispered voice?: yes  Fall Screening Falls in the past year?: (Patient-Rptd) 0 Number of falls in past year: 0 Was there an injury with Fall?: 0 Fall Risk Category Calculator: 0 Patient Fall Risk Level: Low Fall Risk  Fall Risk Patient at Risk for Falls Due to: Impaired mobility Fall risk Follow up: Falls evaluation completed  Home and Transportation Safety: All rugs have non-skid backing?: (!) (Patient-Rptd) no All stairs or steps have railings?: (Patient-Rptd) N/A, no stairs Grab bars in the bathtub or shower?: (Patient-Rptd) yes Have non-skid surface in bathtub or shower?: (Patient-Rptd) yes Good home lighting?: (Patient-Rptd) yes Regular seat belt use?: (Patient-Rptd) yes Hospital stays in the last year:: (Patient-Rptd) no  Cognitive Assessment Difficulty concentrating, remembering, or making decisions? : (Patient-Rptd) yes Will 6CIT or Mini Cog be Completed: yes What year is it?: 0 points What month is it?: 0 points Give patient an address phrase to remember (5 components): [address] [place]  About what time is it?: 0 points Count backwards from 20 to 1: 0 points Say the months of the year in reverse: 0 points Repeat the address phrase from earlier: 0 points 6 CIT Score: 0 points  Advance Directives (For Healthcare) Does Patient Have a Medical Advance Directive?: Yes Does patient want to make changes to medical advance directive?: No - Patient declined Type of Advance Directive: Healthcare Power of [place]; Living will  Reviewed/Updated  Reviewed/Updated: Reviewed All (Medical, Surgical, Family, Medications, Allergies, Care Teams, Patient Goals)    Allergies (verified) Denosumab, Erythromycin,  Ezetimibe, Latex, Other, and Statins   Current Medications (verified) Outpatient Encounter Medications as of 11/01/2024  Medication Sig   amLODipine  (NORVASC ) 5 MG tablet Take 1 tablet (5  mg total) by mouth daily.   Biotin w/ Vitamins C & E (HAIR/SKIN/NAILS PO) Take by mouth.   Calcium -Magnesium -Zinc-Vit D3 (CALCIUM -MAGNESIUM -ZINC-D3 PO) Take by mouth.   dorzolamide  (TRUSOPT ) 2 % ophthalmic solution 1 drop into affected eye Ophthalmic twice a day   dorzolamide -timolol  (COSOPT ) 22.3-6.8 MG/ML ophthalmic solution Place 1 drop into both eyes 2 (two) times daily.    Ferrous Gluconate (IRON 27 PO) Take by mouth.   gabapentin  (NEURONTIN ) 300 MG capsule Take 1 capsule (300 mg total) by mouth 3 (three) times daily.   glucosamine-chondroitin 500-400 MG tablet Take 1 tablet by mouth 3 (three) times daily.   irbesartan  (AVAPRO ) 300 MG tablet TAKE 1 TABLET BY MOUTH DAILY   levothyroxine  (SYNTHROID ) 75 MCG tablet TAKE 1 TABLET BY MOUTH 5 DAYS A  WEEK AND 1 AND 1/2 TABLETS 2  DAYS A WEEK   Multiple Vitamin (MULTIVITAMIN ADULT) TABS Take 1 tablet by mouth daily.   polyvinyl alcohol (LIQUIFILM TEARS) 1.4 % ophthalmic solution Place 1 drop into both eyes daily as needed (for dry eyes).   rosuvastatin  (CRESTOR ) 5 MG tablet TAKE 1 TABLET BY MOUTH TWICE  WEEKLY   VITAMIN D  PO Take by mouth.   latanoprost  (XALATAN ) 0.005 % ophthalmic solution Place 1 drop into the left eye at bedtime.   No facility-administered encounter medications on file as of 11/01/2024.    History: Past Medical History:  Diagnosis Date   Abnormal Pap smear    Abnormal peripheral vision    RT EYE   Allergy    Anemia    Arthritis    back and neck   Breast cancer (HCC) 1997   right breast   Bruises easily    Cataracts, bilateral    Chronic back pain    Chronic neck pain    Complication of anesthesia    slow to wake up   Family history of adverse reaction to anesthesia    Dad takes a long time to wake up   Frequency of  urination    GERD (gastroesophageal reflux disease)    Glaucoma    Goiter    High cholesterol    History of postoperative nausea and vomiting    Hypertension    Hypothyroidism    Neuromuscular disorder (HCC)    Numbness    hands / feet /arms / legs - followed by Dr. [place] - [place]   Osteoporosis    PONV (postoperative nausea and vomiting)    Post-menopausal    Prediabetes    Sexual dysfunction    Shortness of breath    with exertion and Wt gain   Urinary incontinence    Past Surgical History:  Procedure Laterality Date   BIOPSY THYROID      BREAST SURGERY Right    lymph node removed   CATARACT EXTRACTION, BILATERAL  2009,2010   COLONOSCOPY  02/15/2014   Dr.Stark   EYE SURGERY  08/2012   detached retina surg   LAPAROSCOPY  1978   BTL   lumpectomy  1997   Rt   neck fusion  2010   THYROIDECTOMY N/A 05/04/2013   Procedure:  TOTAL THYROIDECTOMY;  Surgeon: Krystal CHRISTELLA Spinner, MD;  Location: [facility];  Service: General;  Laterality: N/A;   TOTAL HIP ARTHROPLASTY Right 01/02/2021   Procedure: TOTAL HIP ARTHROPLASTY ANTERIOR APPROACH;  Surgeon: Ernie Cough, MD;  Location: [facility];  Service: Orthopedics;  Laterality: Right;  70 mins   TUBAL LIGATION     Family History  Problem Relation  Age of Onset   Colon polyps Mother    Hyperlipidemia Mother    Heart disease Mother    Diabetes Mother    Hypertension Mother    Hyperlipidemia Father    Colon cancer Father 60   Hearing loss Sister    Hyperlipidemia Sister    Heart disease Sister    Heart disease Sister    Hyperlipidemia Sister    Stroke Sister    Hypertension Sister    Thyroid  disease Sister    Hyperlipidemia Brother    Drug abuse Brother    Early death Brother    Esophageal cancer Brother 24       smoker   Diabetes Brother    Heart disease Brother    Hypertension Brother    Heart attack Brother    Early death Brother    Colon cancer Paternal Aunt 34   Heart disease Maternal Grandmother    Early death  Maternal Grandmother    Diabetes Maternal Grandmother    Asthma Maternal Grandmother    Diabetes Paternal Grandmother    Hypertension Paternal Grandmother    Heart disease Paternal Grandmother    Breast cancer Paternal Grandmother        dx in her 19s   Heart disease Paternal Grandfather    Depression Daughter    Cervical cancer Daughter        dx in her 62s   Celiac disease Daughter    Rectal cancer Son    Prostate cancer Son    Thyroid  disease Other 59       niece   Social History   Occupational History   Occupation: retired  Tobacco Use   Smoking status: Former    Current packs/day: 0.00    Average packs/day: 0.5 packs/day for 3.0 years (1.5 ttl pk-yrs)    Types: Cigarettes    Start date: 01/24/1966    Quit date: 04/27/1968    Years since quitting: 56.5   Smokeless tobacco: Never   Tobacco comments:    quit over 40 years ago  Vaping Use   Vaping status: Never Used  Substance and Sexual Activity   Alcohol use: Yes    Comment: 1 every other month   Drug use: No   Sexual activity: Not Currently   Tobacco Counseling Counseling given: Not Answered Tobacco comments: quit over 40 years ago  SDOH Screenings   Food Insecurity: No Food Insecurity (10/31/2024)  Housing: Low Risk  (10/31/2024)  Transportation Needs: No Transportation Needs (10/31/2024)  Utilities: Not At Risk (11/01/2024)  Alcohol Screen: Low Risk  (10/31/2024)  Depression (PHQ2-9): Low Risk  (11/01/2024)  Financial Resource Strain: Low Risk  (10/31/2024)  Physical Activity: Sufficiently Active (10/31/2024)  Social Connections: Moderately Integrated (10/31/2024)  Stress: No Stress Concern Present (10/31/2024)  Tobacco Use: Medium Risk (11/01/2024)  Health Literacy: Adequate Health Literacy (11/01/2024)   See flowsheets for full screening details  Depression Screen PHQ 2 & 9 Depression Scale- Over the past 2 weeks, how often have you been bothered by any of the following problems? Little interest or pleasure in  doing things: 0 Feeling down, depressed, or hopeless (PHQ Adolescent also includes...irritable): 1 (lost a family memeber /grief) PHQ-2 Total Score: 1     Goals Addressed               This Visit's Progress     keep moving and exercise (pt-stated)        Keep moving and exercise  Objective:    Today's Vitals   11/01/24 1445  Weight: 158 lb (71.7 kg)  Height: 5' 3 (1.6 m)   Body mass index is 27.99 kg/m.  Hearing/Vision screen Hearing Screening - Comments:: Pt stated slight HOH  Vision Screening - Comments:: Wears rx glasses - up to date with routine eye exams with Dr KANDICE  Immunizations and Health Maintenance Health Maintenance  Topic Date Due   COVID-19 Vaccine (1) Never done   Zoster Vaccines- Shingrix (1 of 2) Never done   Pneumococcal Vaccine: 50+ Years (1 of 1 - PCV) 12/23/2024 (Originally 03/04/1997)   Influenza Vaccine  02/22/2025 (Originally 06/25/2024)   Mammogram  04/02/2025   Colonoscopy  04/07/2025   Medicare Annual Wellness (AWV)  11/01/2025   Bone Density Scan  Completed   Meningococcal B Vaccine  Aged Out   DTaP/Tdap/Td  Discontinued   Hepatitis C Screening  Discontinued        Assessment/Plan:  This is a routine wellness examination for [place].  Patient Care Team: Wendolyn Jenkins Jansky, MD as PCP - General (Family Medicine) Croitoru, Jerel, MD as PCP - Cardiology (Cardiology)  I have personally reviewed and noted the following in the patient's chart:   Medical and social history Use of alcohol, tobacco or illicit drugs  Current medications and supplements including opioid prescriptions. Functional ability and status Nutritional status Physical activity Advanced directives List of other physicians Hospitalizations, surgeries, and ER visits in previous 12 months Vitals Screenings to include cognitive, depression, and falls Referrals and appointments  No orders of the defined types were placed in this encounter.  In addition,  I have reviewed and discussed with patient certain preventive protocols, quality metrics, and best practice recommendations. A written personalized care plan for preventive services as well as general preventive health recommendations were provided to patient.   Ellouise VEAR Haws, LPN   [id number]   Return in about 1 year (around 11/01/2025).  After Visit Summary: (MyChart) Due to this being a telephonic visit, the after visit summary with patients personalized plan was offered to patient via MyChart   Nurse Notes: none

## 2024-11-02 NOTE — Addendum Note (Signed)
 Addended by: JAYLENE ELLOUISE DEL on: 11/02/2024 10:33 AM   Modules accepted: Orders, Level of Service

## 2024-12-12 ENCOUNTER — Other Ambulatory Visit: Payer: Self-pay | Admitting: Family Medicine

## 2024-12-12 DIAGNOSIS — I10 Essential (primary) hypertension: Secondary | ICD-10-CM

## 2024-12-27 ENCOUNTER — Ambulatory Visit: Admitting: Family Medicine

## 2025-08-17 ENCOUNTER — Ambulatory Visit

## 2025-11-02 ENCOUNTER — Ambulatory Visit
